# Patient Record
Sex: Male | Born: 1950 | Race: White | Hispanic: No | Marital: Married | Smoking: Current every day smoker
Health system: Southern US, Community
[De-identification: ages and names within clinical notes are randomized; demographics above are authoritative.]

## PROBLEM LIST (undated history)

## (undated) DIAGNOSIS — E119 Type 2 diabetes mellitus without complications: Secondary | ICD-10-CM

## (undated) DIAGNOSIS — M359 Systemic involvement of connective tissue, unspecified: Secondary | ICD-10-CM

## (undated) DIAGNOSIS — I739 Peripheral vascular disease, unspecified: Secondary | ICD-10-CM

## (undated) DIAGNOSIS — B351 Tinea unguium: Secondary | ICD-10-CM

## (undated) DIAGNOSIS — Z720 Tobacco use: Secondary | ICD-10-CM

## (undated) DIAGNOSIS — E1140 Type 2 diabetes mellitus with diabetic neuropathy, unspecified: Secondary | ICD-10-CM

## (undated) DIAGNOSIS — I96 Gangrene, not elsewhere classified: Secondary | ICD-10-CM

## (undated) DIAGNOSIS — I10 Essential (primary) hypertension: Secondary | ICD-10-CM

## (undated) DIAGNOSIS — E7800 Pure hypercholesterolemia, unspecified: Secondary | ICD-10-CM

## (undated) DIAGNOSIS — E079 Disorder of thyroid, unspecified: Secondary | ICD-10-CM

## (undated) HISTORY — DX: Disorder of thyroid, unspecified: E07.9

## (undated) HISTORY — DX: Gangrene, not elsewhere classified: I96

## (undated) HISTORY — DX: Tinea unguium: B35.1

## (undated) HISTORY — DX: Systemic involvement of connective tissue, unspecified: M35.9

## (undated) HISTORY — DX: Peripheral vascular disease, unspecified: I73.9

## (undated) HISTORY — PX: CHOLECYSTECTOMY: SHX55

## (undated) HISTORY — DX: Essential (primary) hypertension: I10

---

## 1999-09-11 ENCOUNTER — Ambulatory Visit (HOSPITAL_COMMUNITY): Admission: RE | Admit: 1999-09-11 | Discharge: 1999-09-11 | Payer: Self-pay | Admitting: Neurosurgery

## 1999-09-11 ENCOUNTER — Encounter: Payer: Self-pay | Admitting: Neurosurgery

## 2011-06-20 HISTORY — PX: CHOLECYSTECTOMY: SHX55

## 2011-10-08 ENCOUNTER — Encounter: Payer: Self-pay | Admitting: Surgery

## 2011-10-18 ENCOUNTER — Encounter: Payer: Self-pay | Admitting: Surgery

## 2011-10-21 ENCOUNTER — Encounter (INDEPENDENT_AMBULATORY_CARE_PROVIDER_SITE_OTHER): Payer: BC Managed Care – PPO | Admitting: *Deleted

## 2011-10-21 ENCOUNTER — Other Ambulatory Visit: Payer: Self-pay | Admitting: *Deleted

## 2011-10-21 ENCOUNTER — Encounter: Payer: Self-pay | Admitting: Surgery

## 2011-10-21 ENCOUNTER — Encounter: Payer: Self-pay | Admitting: *Deleted

## 2011-10-21 ENCOUNTER — Ambulatory Visit (INDEPENDENT_AMBULATORY_CARE_PROVIDER_SITE_OTHER): Payer: BC Managed Care – PPO | Admitting: *Deleted

## 2011-10-21 ENCOUNTER — Ambulatory Visit (INDEPENDENT_AMBULATORY_CARE_PROVIDER_SITE_OTHER): Payer: BC Managed Care – PPO | Admitting: Surgery

## 2011-10-21 VITALS — BP 120/76 | HR 96 | Temp 98.2°F | Ht 72.0 in | Wt 168.0 lb

## 2011-10-21 DIAGNOSIS — I70219 Atherosclerosis of native arteries of extremities with intermittent claudication, unspecified extremity: Secondary | ICD-10-CM

## 2011-10-21 DIAGNOSIS — M79609 Pain in unspecified limb: Secondary | ICD-10-CM

## 2011-10-21 DIAGNOSIS — M7989 Other specified soft tissue disorders: Secondary | ICD-10-CM

## 2011-10-21 NOTE — Procedures (Unsigned)
LOWER EXTREMITY ARTERIAL DUPLEX  INDICATION:  Bilateral claudication.  HISTORY: Diabetes:  Yes Cardiac:  No Hypertension:  Yes Smoking:  Yes Previous Surgery:  SINGLE LEVEL ARTERIAL EXAM                         RIGHT                LEFT Brachial: Anterior tibial: Posterior tibial: Peroneal: Ankle/Brachial Index:   0.97                 0.83 Prior ABI:  07/09/2011                       0.70                 0.70  LOWER EXTREMITY ARTERIAL DUPLEX EXAM  DUPLEX:  Calcified noncalcific plaque throughout both lower extremities with triphasic and biphasic waveforms from the common femoral artery to the popliteal arteries.  Stenosis is visualized in the right anterior tibial artery.  Damped monophasic waveforms noted at the posterior tibial arteries bilaterally.  IMPRESSION:  Bilateral tibioperoneal disease.  See attached for ABIs and imaging.  ___________________________________________ V. Charlena Cross, MD  SS/MEDQ  D:  10/21/2011  T:  10/21/2011  Job:  [id number]

## 2011-10-21 NOTE — Progress Notes (Signed)
[facility] and [facility]in Specialist of [facility]   Patient name: Adrian Bean MRN: [id number] DOB: 11-10-1950 Sex: male   Referred by: Dr. Sherral Hammers  Reason for referral:  Chief Complaint  Patient presents with  . New Evaluation    pt c/o numbness and pain in limbs referred by Dr. Golda Acre    HISTORY OF PRESENT ILLNESS: Since today for evaluation of numbness in his legs feet and arms. He states that she's been having numbness in his feet for approximately several months. He states that it is constant. There are no aggravating or relieving factors. He does not endorse signs or symptoms of claudication. He states that his legs do not get out or clamp. He has had a ulceration on the fifth toe on the right that has been draining for approximately one month. The patient is a diabetic he does not know his most recent hemoglobin A1c but he states his sugars are usually less than 200. The patient does smoke. He smokes about one pack every month. He is a borderline hypertensive. He is taken ACE inhibitor.  Past Medical History  Diagnosis Date  . Diabetes mellitus     type 2  . Peripheral vascular disease   . Hypertension   . Dermatophytosis, nail   . Thyroid disorder   . Diffuse connective tissue disease     Past Surgical History  Procedure Date  . Cholecystectomy   . Cholecystectomy 06-20-2011    History   Social History  . Marital Status: Married    Spouse Name: N/A    Number of Children: N/A  . Years of Education: N/A   Occupational History  . Not on file.   Social History Main Topics  . Smoking status: Current Everyday Smoker -- 0.1 packs/day for 50 years  . Smokeless tobacco: Not on file   Comment: pt states that he is not addicted, and it's not a big deal to quit  . Alcohol Use: 0.0 - 0.5 oz/week    0-1 drink(s) per week  . Drug Use: No  . Sexually Active: Not on file   Other Topics Concern  . Not on file   Social History Narrative  . No narrative on file    Family  History  Problem Relation Age of Onset  . Adopted: Yes    Allergies as of 10/21/2011  . (No Known Allergies)    Current Outpatient Prescriptions on File Prior to Visit  Medication Sig Dispense Refill  . gabapentin (NEURONTIN) 300 MG capsule Take 300 mg by mouth 3 (three) times daily.      Marland Kitchen glyBURIDE (DIABETA) 5 MG tablet Take 5 mg by mouth 2 (two) times daily.      Marland Kitchen lisinopril (PRINIVIL,ZESTRIL) 10 MG tablet Take 10 mg by mouth daily.      . metFORMIN (GLUCOPHAGE) 1000 MG tablet Take 1,000 mg by mouth daily.      Marland Kitchen terbinafine (LAMISIL) 250 MG tablet Take 250 mg by mouth daily.         REVIEW OF SYSTEMS: Positive for pain in legs with walking and lying flat. Positive for swelling in his legs. Positive for weakness in both his arms and legs. Positive for dizziness and temporary loss of vision. Positive for chills. All other systems are negative  PHYSICAL EXAMINATION: General: The patient appears their stated age.  Vital signs are BP 120/76  Pulse 96  Temp(Src) 98.2 F (36.8 C) (Oral)  Ht 6' (1.829 m)  Wt 168 lb (76.204 kg)  BMI  22.78 kg/m2 HEENT:  No gross abnormalities Pulmonary: Respirations are non-labored Abdomen: Soft and non-tender  Musculoskeletal: There are no major deformities.   Neurologic: No focal weakness or paresthesias are detected, Skin: 1 mm ulcer on the fifth toe without evidence of infection. There is serous drainage Psychiatric: The patient has normal affect. Cardiovascular: There is a regular rate and rhythm without significant murmur appreciated. No carotid bruits. Palpable femoral pulses. palpable radial pulses bilaterally  Diagnostic Studies: ABIs were rechecked today ABIs were 0.97 on the right and 20 30 left however waveforms are monophasic. Concern is for pulse elevated ABIs due to 2 calcifications    Assessment:  Right leg ulcer, bilateral numbness Plan: I discussed the ultrasound findings today with the patient. I'm not convinced that  arterial insufficiency is the etiology of his numbness. However, given that he has had a nonhealing small ulcer on his right fifth toe for the past month in the setting of monophasic waveforms by duplex certainly reasonable to proceed with arteriogram. I do this with the intent of intravenous possible. I plan on accessing the left groin, studying both legs, and intervening on the right if technically possible. I discussed with the patient this may or may not improve his numbness however in a diabetic with a chronic ulcer I feel that he bloodflow needs to be evaluated to do impossible to prevent limb loss. This procedure has been scheduled for Wednesday May 1st     V. Charlena Cross, M.D. [facility] of [place] Office: [phone] Pager:  [phone]

## 2011-10-23 ENCOUNTER — Encounter (HOSPITAL_COMMUNITY): Payer: Self-pay | Admitting: Respiratory Therapy

## 2011-10-30 ENCOUNTER — Encounter (HOSPITAL_COMMUNITY)
Admission: RE | Disposition: A | Discharge status: Home / Self Care | Payer: Self-pay | Source: Ambulatory Visit | Attending: Surgery

## 2011-10-30 ENCOUNTER — Telehealth: Payer: Self-pay | Admitting: Surgery

## 2011-10-30 ENCOUNTER — Ambulatory Visit (HOSPITAL_COMMUNITY)
Admission: RE | Admit: 2011-10-30 | Discharge: 2011-10-30 | Disposition: A | Discharge status: Home / Self Care | Payer: BC Managed Care – PPO | Source: Ambulatory Visit | Attending: Surgery | Admitting: Surgery

## 2011-10-30 DIAGNOSIS — L98499 Non-pressure chronic ulcer of skin of other sites with unspecified severity: Secondary | ICD-10-CM

## 2011-10-30 DIAGNOSIS — L97509 Non-pressure chronic ulcer of other part of unspecified foot with unspecified severity: Secondary | ICD-10-CM | POA: Insufficient documentation

## 2011-10-30 DIAGNOSIS — M358 Other specified systemic involvement of connective tissue: Secondary | ICD-10-CM | POA: Insufficient documentation

## 2011-10-30 DIAGNOSIS — I10 Essential (primary) hypertension: Secondary | ICD-10-CM | POA: Insufficient documentation

## 2011-10-30 DIAGNOSIS — F17200 Nicotine dependence, unspecified, uncomplicated: Secondary | ICD-10-CM | POA: Insufficient documentation

## 2011-10-30 DIAGNOSIS — M3589 Other specified systemic involvement of connective tissue: Secondary | ICD-10-CM | POA: Insufficient documentation

## 2011-10-30 DIAGNOSIS — I739 Peripheral vascular disease, unspecified: Secondary | ICD-10-CM | POA: Insufficient documentation

## 2011-10-30 DIAGNOSIS — E119 Type 2 diabetes mellitus without complications: Secondary | ICD-10-CM | POA: Insufficient documentation

## 2011-10-30 HISTORY — PX: ABDOMINAL AORTAGRAM: SHX5454

## 2011-10-30 LAB — POCT I-STAT, CHEM 8
BUN: 19 mg/dL (ref 6–23)
Calcium, Ion: 1.19 mmol/L (ref 1.12–1.32)
Chloride: 106 mEq/L (ref 96–112)
Glucose, Bld: 176 mg/dL — ABNORMAL HIGH (ref 70–99)

## 2011-10-30 LAB — POCT ACTIVATED CLOTTING TIME
Activated Clotting Time: 232 seconds
Activated Clotting Time: 188 seconds

## 2011-10-30 SURGERY — ABDOMINAL AORTAGRAM
Anesthesia: LOCAL

## 2011-10-30 MED ORDER — FENTANYL CITRATE 0.05 MG/ML IJ SOLN
INTRAMUSCULAR | Status: AC
  Filled 2011-10-30: qty 2

## 2011-10-30 MED ORDER — VERAPAMIL HCL 2.5 MG/ML IV SOLN
INTRAVENOUS | Status: AC
  Filled 2011-10-30: qty 2

## 2011-10-30 MED ORDER — MIDAZOLAM HCL 2 MG/2ML IJ SOLN
INTRAMUSCULAR | Status: AC
  Filled 2011-10-30: qty 2

## 2011-10-30 MED ORDER — LIDOCAINE HCL (PF) 1 % IJ SOLN
INTRAMUSCULAR | Status: AC
  Filled 2011-10-30: qty 30

## 2011-10-30 MED ORDER — HEPARIN (PORCINE) IN NACL 2-0.9 UNIT/ML-% IJ SOLN
INTRAMUSCULAR | Status: AC
  Filled 2011-10-30: qty 1000

## 2011-10-30 MED ORDER — SODIUM CHLORIDE 0.9 % IV SOLN
INTRAVENOUS | Status: DC
  Administered 2011-10-30: 1000 mL via INTRAVENOUS

## 2011-10-30 NOTE — Discharge Instructions (Signed)

## 2011-10-30 NOTE — Op Note (Addendum)
[facility]ists of [place]  Patient name: Adrian Bean MRN: [id number] DOB: Mar 27, 1951 Sex: male  10/30/2011 Pre-operative Diagnosis: Right leg ulcer Post-operative diagnosis:  Same Surgeon:  Jorge Ny Procedure Performed:  1.  ultrasound access left femoral artery  2.  abdominal aortogram  3.  bilateral lower extremity runoff  4.  atherectomy and angioplasty, right posterior tibial artery  5.  intra-arterial administration of nitroglycerin    Indications:  Patient has a history of pain in his right foot as well as a nonhealing ulcer. Ultrasound revealed monophasic waveforms and calcified vessels. He comes in today for arteriogram  Procedure:  The patient was identified in the holding area and taken to room 8.  The patient was then placed supine on the table and prepped and draped in the usual sterile fashion.  A time out was called.  Ultrasound was used to evaluate the left common femoral artery.  It was patent .  A digital ultrasound image was acquired.  A micropuncture needle was used to access the left common femoral artery under ultrasound guidance.  An 018 wire was advanced without resistance and a micropuncture sheath was placed.  The 018 wire was removed and a benson wire was placed.  The micropuncture sheath was exchanged for a 5 french sheath.  An omniflush catheter was advanced over the wire to the level of L-1.  An abdominal angiogram was obtained.  Next, using the omniflush catheter and a benson wire, the aortic bifurcation was crossed and the catheter was placed into theright external iliac artery and right runoff was obtained.  left runoff was performed via retrograde sheath injections.  Findings:   Aortogram:  The visualized portions of the suprarenal abdominal aorta showed no significant disease. The infrarenal abdominal aorta is widely patent. There are single renal arteries bilaterally without stenosis. Bilateral common external and internal iliac  arteries are patent throughout their course without hemodynamically significant stenosis.  Right Lower Extremity:  The right common femoral artery is patent throughout it's course. The right profunda femoral artery is patent the right superficial femoral artery is patent. There are calcific changes to the right superficial femoral artery in the adductor canal. The popliteal artery is patent throughout it's course. There is single vessel runoff via the posterior tibial artery. There is a high-grade stenosis, 95% at its origin  Left Lower Extremity:  The left common femoral artery is patent throughout it's course. The profunda femoral artery is patent. The superficial femoral artery is patent. There is approximately 20-30% stenosis within the adductor canal. The popliteal artery is patent. There is occlusion of all 3 runoff vessels. There is distal reconstitution of the posterior tibial artery which then collateralizes to the dorsalis pedis via the plantar arch  Intervention:  Over a Benson wire a 6 French 45 cm antral 1 sheath was placed. The patient was fully heparinized. Using an 014 wire and a CXI catheter I was able to cross the lesion in the posterior tibial artery. A wire exchange was performed and the viper wire was placed. I selected a CSI 1.25 atherectomy device and performed atherectomy at 6090 and 120 RPMs for 25 seconds each. I then performed angioplasty of the treated area using a 3 x 2 Fox SV balloon. 400 mcg of nitroglycerin were administered through the sheath. I performed angioplasty on 2 occasions. Completion angiography revealed widely patent posterior tibial artery with resolution of the stenosis. At this point the decision was made to terminate the procedure. Catheters  and wires were removed. The sheath was withdrawn to the left external iliac artery and the patient was taken to the holding area for sheath pull once the coagulation profile corrects.  Impression:  #1  successful atherectomy  and angioplasty of a 95% stenosis within the proximal right posterior tibial artery  #2  severe tibial disease on the left    V. Durene Cal, M.D. [facility] [place] Office: [phone] Pager:  [phone]

## 2011-10-30 NOTE — Telephone Encounter (Signed)
Message copied by Sara Chu on Wed Oct 30, 2011  4:59 PM ------      Message from: Melene Plan      Created: Wed Oct 30, 2011  3:12 PM                   ----- Message -----         From: Nada Libman, MD         Sent: 10/30/2011  12:30 PM           To: Cydney Ok, Melene Plan, RN            10/30/2011, the patient had the following procedures:             1.  ultrasound access left femoral artery       2.  abdominal aortogram       3.  bilateral lower extremity runoff       4.  atherectomy and angioplasty, right posterior tibial artery       5.  intra-arterial administration of nitroglycerin                  He needs followup in 3 weeks to see ME with bilateral ABI

## 2011-10-30 NOTE — Progress Notes (Signed)
Pt signed out AMA today post angiogram. Pt was given risks af getting up prior to D/C time. Dr Early called and notified.

## 2011-10-30 NOTE — Interval H&P Note (Signed)
History and Physical Interval Note:  10/30/2011 10:44 AM  Adrian Bean  has presented today for surgery, with the diagnosis of PVD  The various methods of treatment have been discussed with the patient and family. After consideration of risks, benefits and other options for treatment, the patient has consented to  Procedure(s) (LRB): ABDOMINAL AORTAGRAM (N/A) as a surgical intervention .  The patients' history has been reviewed, patient examined, no change in status, stable for surgery.  I have reviewed the patients' chart and labs.  Questions were answered to the patient's satisfaction.     Magon Croson IV, V. WELLS

## 2011-10-30 NOTE — H&P (View-Only) (Signed)
[facility]ascular and Vein Specialist of [place]   Patient name: Adrian Bean MRN: [id number] DOB: 01/01/1951 Sex: male   Referred by: Dr. Robbins  Reason for referral:  Chief Complaint  Patient presents with  . New Evaluation    pt c/o numbness and pain in limbs referred by Dr. Robbbins    HISTORY OF PRESENT ILLNESS: Since today for evaluation of numbness in his legs feet and arms. He states that she's been having numbness in his feet for approximately several months. He states that it is constant. There are no aggravating or relieving factors. He does not endorse signs or symptoms of claudication. He states that his legs do not get out or clamp. He has had a ulceration on the fifth toe on the right that has been draining for approximately one month. The patient is a diabetic he does not know his most recent hemoglobin A1c but he states his sugars are usually less than 200. The patient does smoke. He smokes about one pack every month. He is a borderline hypertensive. He is taken ACE inhibitor.  Past Medical History  Diagnosis Date  . Diabetes mellitus     type 2  . Peripheral vascular disease   . Hypertension   . Dermatophytosis, nail   . Thyroid disorder   . Diffuse connective tissue disease     Past Surgical History  Procedure Date  . Cholecystectomy   . Cholecystectomy 06-20-2011    History   Social History  . Marital Status: Married    Spouse Name: N/A    Number of Children: N/A  . Years of Education: N/A   Occupational History  . Not on file.   Social History Main Topics  . Smoking status: Current Everyday Smoker -- 0.1 packs/day for 50 years  . Smokeless tobacco: Not on file   Comment: pt states that he is not addicted, and it's not a big deal to quit  . Alcohol Use: 0.0 - 0.5 oz/week    0-1 drink(s) per week  . Drug Use: No  . Sexually Active: Not on file   Other Topics Concern  . Not on file   Social History Narrative  . No narrative on file    Family  History  Problem Relation Age of Onset  . Adopted: Yes    Allergies as of 10/21/2011  . (No Known Allergies)    Current Outpatient Prescriptions on File Prior to Visit  Medication Sig Dispense Refill  . gabapentin (NEURONTIN) 300 MG capsule Take 300 mg by mouth 3 (three) times daily.      . glyBURIDE (DIABETA) 5 MG tablet Take 5 mg by mouth 2 (two) times daily.      . lisinopril (PRINIVIL,ZESTRIL) 10 MG tablet Take 10 mg by mouth daily.      . metFORMIN (GLUCOPHAGE) 1000 MG tablet Take 1,000 mg by mouth daily.      . terbinafine (LAMISIL) 250 MG tablet Take 250 mg by mouth daily.         REVIEW OF SYSTEMS: Positive for pain in legs with walking and lying flat. Positive for swelling in his legs. Positive for weakness in both his arms and legs. Positive for dizziness and temporary loss of vision. Positive for chills. All other systems are negative  PHYSICAL EXAMINATION: General: The patient appears their stated age.  Vital signs are BP 120/76  Pulse 96  Temp(Src) 98.2 F (36.8 C) (Oral)  Ht 6' (1.829 m)  Wt 168 lb (76.204 kg)  BMI   22.78 kg/m2 HEENT:  No gross abnormalities Pulmonary: Respirations are non-labored Abdomen: Soft and non-tender  Musculoskeletal: There are no major deformities.   Neurologic: No focal weakness or paresthesias are detected, Skin: 1 mm ulcer on the fifth toe without evidence of infection. There is serous drainage Psychiatric: The patient has normal affect. Cardiovascular: There is a regular rate and rhythm without significant murmur appreciated. No carotid bruits. Palpable femoral pulses. palpable radial pulses bilaterally  Diagnostic Studies: ABIs were rechecked today ABIs were 0.97 on the right and 20 30 left however waveforms are monophasic. Concern is for pulse elevated ABIs due to 2 calcifications    Assessment:  Right leg ulcer, bilateral numbness Plan: I discussed the ultrasound findings today with the patient. I'm not convinced that  arterial insufficiency is the etiology of his numbness. However, given that he has had a nonhealing small ulcer on his right fifth toe for the past month in the setting of monophasic waveforms by duplex certainly reasonable to proceed with arteriogram. I do this with the intent of intravenous possible. I plan on accessing the left groin, studying both legs, and intervening on the right if technically possible. I discussed with the patient this may or may not improve his numbness however in a diabetic with a chronic ulcer I feel that he bloodflow needs to be evaluated to do impossible to prevent limb loss. This procedure has been scheduled for Wednesday May 1st     V. Wells Sreeja Spies IV, M.D. [facility] [place] Office: [phone] Pager:  [phone]   

## 2011-10-30 NOTE — Telephone Encounter (Signed)
Tried to call patient with appt date/time -- mailed appt reminder

## 2011-10-31 ENCOUNTER — Other Ambulatory Visit: Payer: Self-pay | Admitting: *Deleted

## 2011-10-31 DIAGNOSIS — I70219 Atherosclerosis of native arteries of extremities with intermittent claudication, unspecified extremity: Secondary | ICD-10-CM

## 2011-11-14 ENCOUNTER — Telehealth: Payer: Self-pay | Admitting: Surgery

## 2011-11-14 NOTE — Telephone Encounter (Signed)
I attempted to call patient but there was no answer and no voice mail. Patient called Revonda Standard to cancel his appt for 11/18/11 w/ [facility] and he does not wish to reschedule. Her note is w/ staff messages from 11/14/11. Jacklyn Shell

## 2011-11-18 ENCOUNTER — Ambulatory Visit: Payer: BC Managed Care – PPO | Admitting: Surgery

## 2014-06-09 ENCOUNTER — Encounter (HOSPITAL_COMMUNITY): Payer: Self-pay | Admitting: Surgery

## 2016-07-01 HISTORY — PX: FOOT RAY RESECTION: SHX1668

## 2016-10-16 DIAGNOSIS — F17200 Nicotine dependence, unspecified, uncomplicated: Secondary | ICD-10-CM | POA: Diagnosis not present

## 2016-10-16 DIAGNOSIS — Z9119 Patient's noncompliance with other medical treatment and regimen: Secondary | ICD-10-CM | POA: Diagnosis not present

## 2016-10-16 DIAGNOSIS — R600 Localized edema: Secondary | ICD-10-CM

## 2016-10-16 DIAGNOSIS — E119 Type 2 diabetes mellitus without complications: Secondary | ICD-10-CM

## 2016-10-16 DIAGNOSIS — N179 Acute kidney failure, unspecified: Secondary | ICD-10-CM | POA: Diagnosis not present

## 2016-10-16 DIAGNOSIS — Z8679 Personal history of other diseases of the circulatory system: Secondary | ICD-10-CM | POA: Diagnosis not present

## 2016-10-16 DIAGNOSIS — E11621 Type 2 diabetes mellitus with foot ulcer: Secondary | ICD-10-CM

## 2016-10-16 DIAGNOSIS — M869 Osteomyelitis, unspecified: Secondary | ICD-10-CM

## 2016-10-17 DIAGNOSIS — R600 Localized edema: Secondary | ICD-10-CM | POA: Diagnosis not present

## 2016-10-17 DIAGNOSIS — E119 Type 2 diabetes mellitus without complications: Secondary | ICD-10-CM | POA: Diagnosis not present

## 2016-10-17 DIAGNOSIS — E11621 Type 2 diabetes mellitus with foot ulcer: Secondary | ICD-10-CM | POA: Diagnosis not present

## 2016-10-17 DIAGNOSIS — M869 Osteomyelitis, unspecified: Secondary | ICD-10-CM | POA: Diagnosis not present

## 2016-10-17 DIAGNOSIS — F17200 Nicotine dependence, unspecified, uncomplicated: Secondary | ICD-10-CM

## 2016-10-18 DIAGNOSIS — E11621 Type 2 diabetes mellitus with foot ulcer: Secondary | ICD-10-CM | POA: Diagnosis not present

## 2016-10-18 DIAGNOSIS — R600 Localized edema: Secondary | ICD-10-CM | POA: Diagnosis not present

## 2016-10-18 DIAGNOSIS — E119 Type 2 diabetes mellitus without complications: Secondary | ICD-10-CM | POA: Diagnosis not present

## 2016-10-18 DIAGNOSIS — M869 Osteomyelitis, unspecified: Secondary | ICD-10-CM | POA: Diagnosis not present

## 2016-10-19 DIAGNOSIS — E11621 Type 2 diabetes mellitus with foot ulcer: Secondary | ICD-10-CM | POA: Diagnosis not present

## 2016-10-19 DIAGNOSIS — R600 Localized edema: Secondary | ICD-10-CM | POA: Diagnosis not present

## 2016-10-19 DIAGNOSIS — E119 Type 2 diabetes mellitus without complications: Secondary | ICD-10-CM | POA: Diagnosis not present

## 2016-10-19 DIAGNOSIS — M869 Osteomyelitis, unspecified: Secondary | ICD-10-CM | POA: Diagnosis not present

## 2016-10-20 DIAGNOSIS — M869 Osteomyelitis, unspecified: Secondary | ICD-10-CM | POA: Diagnosis not present

## 2016-10-20 DIAGNOSIS — E119 Type 2 diabetes mellitus without complications: Secondary | ICD-10-CM | POA: Diagnosis not present

## 2016-10-20 DIAGNOSIS — E11621 Type 2 diabetes mellitus with foot ulcer: Secondary | ICD-10-CM | POA: Diagnosis not present

## 2016-10-20 DIAGNOSIS — N179 Acute kidney failure, unspecified: Secondary | ICD-10-CM

## 2016-10-20 DIAGNOSIS — R600 Localized edema: Secondary | ICD-10-CM | POA: Diagnosis not present

## 2016-10-21 DIAGNOSIS — E11621 Type 2 diabetes mellitus with foot ulcer: Secondary | ICD-10-CM | POA: Diagnosis not present

## 2016-10-21 DIAGNOSIS — R600 Localized edema: Secondary | ICD-10-CM | POA: Diagnosis not present

## 2016-10-21 DIAGNOSIS — M869 Osteomyelitis, unspecified: Secondary | ICD-10-CM | POA: Diagnosis not present

## 2016-10-21 DIAGNOSIS — E119 Type 2 diabetes mellitus without complications: Secondary | ICD-10-CM | POA: Diagnosis not present

## 2016-10-22 DIAGNOSIS — E119 Type 2 diabetes mellitus without complications: Secondary | ICD-10-CM | POA: Diagnosis not present

## 2016-10-22 DIAGNOSIS — M869 Osteomyelitis, unspecified: Secondary | ICD-10-CM | POA: Diagnosis not present

## 2016-10-22 DIAGNOSIS — R600 Localized edema: Secondary | ICD-10-CM | POA: Diagnosis not present

## 2016-10-22 DIAGNOSIS — E11621 Type 2 diabetes mellitus with foot ulcer: Secondary | ICD-10-CM | POA: Diagnosis not present

## 2016-10-23 DIAGNOSIS — M869 Osteomyelitis, unspecified: Secondary | ICD-10-CM | POA: Diagnosis not present

## 2016-10-23 DIAGNOSIS — R600 Localized edema: Secondary | ICD-10-CM | POA: Diagnosis not present

## 2016-10-23 DIAGNOSIS — E11621 Type 2 diabetes mellitus with foot ulcer: Secondary | ICD-10-CM | POA: Diagnosis not present

## 2016-10-23 DIAGNOSIS — E119 Type 2 diabetes mellitus without complications: Secondary | ICD-10-CM | POA: Diagnosis not present

## 2016-10-24 DIAGNOSIS — E119 Type 2 diabetes mellitus without complications: Secondary | ICD-10-CM | POA: Diagnosis not present

## 2016-10-24 DIAGNOSIS — E11621 Type 2 diabetes mellitus with foot ulcer: Secondary | ICD-10-CM | POA: Diagnosis not present

## 2016-10-24 DIAGNOSIS — M869 Osteomyelitis, unspecified: Secondary | ICD-10-CM | POA: Diagnosis not present

## 2016-10-24 DIAGNOSIS — R600 Localized edema: Secondary | ICD-10-CM | POA: Diagnosis not present

## 2016-10-25 DIAGNOSIS — M869 Osteomyelitis, unspecified: Secondary | ICD-10-CM | POA: Diagnosis not present

## 2016-10-25 DIAGNOSIS — E119 Type 2 diabetes mellitus without complications: Secondary | ICD-10-CM | POA: Diagnosis not present

## 2016-10-25 DIAGNOSIS — E11621 Type 2 diabetes mellitus with foot ulcer: Secondary | ICD-10-CM | POA: Diagnosis not present

## 2016-10-25 DIAGNOSIS — R600 Localized edema: Secondary | ICD-10-CM | POA: Diagnosis not present

## 2016-12-29 HISTORY — PX: PERIPHERAL VASCULAR CATHETERIZATION: SHX172C

## 2017-01-03 ENCOUNTER — Other Ambulatory Visit (HOSPITAL_COMMUNITY): Payer: Self-pay | Admitting: Interventional Radiology

## 2017-01-03 DIAGNOSIS — I771 Stricture of artery: Secondary | ICD-10-CM

## 2017-01-10 ENCOUNTER — Other Ambulatory Visit: Payer: Self-pay | Admitting: Student

## 2017-01-10 ENCOUNTER — Other Ambulatory Visit: Payer: Self-pay | Admitting: Physician Assistant

## 2017-01-13 ENCOUNTER — Ambulatory Visit (HOSPITAL_COMMUNITY): Admission: RE | Admit: 2017-01-13 | Payer: Medicare Other | Source: Ambulatory Visit

## 2017-01-13 ENCOUNTER — Telehealth (HOSPITAL_COMMUNITY): Payer: Self-pay | Admitting: Radiology

## 2017-01-15 ENCOUNTER — Other Ambulatory Visit: Payer: Self-pay | Admitting: Radiology

## 2017-01-17 ENCOUNTER — Ambulatory Visit (HOSPITAL_COMMUNITY)
Admission: RE | Admit: 2017-01-17 | Discharge: 2017-01-17 | Disposition: A | Discharge status: Home / Self Care | Payer: BC Managed Care – PPO | Source: Ambulatory Visit | Attending: Interventional Radiology | Admitting: Interventional Radiology

## 2017-01-17 ENCOUNTER — Other Ambulatory Visit (HOSPITAL_COMMUNITY): Payer: Self-pay | Admitting: Interventional Radiology

## 2017-01-17 ENCOUNTER — Encounter (HOSPITAL_COMMUNITY): Payer: Self-pay

## 2017-01-17 DIAGNOSIS — Z7984 Long term (current) use of oral hypoglycemic drugs: Secondary | ICD-10-CM | POA: Insufficient documentation

## 2017-01-17 DIAGNOSIS — G8929 Other chronic pain: Secondary | ICD-10-CM | POA: Diagnosis not present

## 2017-01-17 DIAGNOSIS — I70222 Atherosclerosis of native arteries of extremities with rest pain, left leg: Secondary | ICD-10-CM | POA: Insufficient documentation

## 2017-01-17 DIAGNOSIS — Z7902 Long term (current) use of antithrombotics/antiplatelets: Secondary | ICD-10-CM | POA: Insufficient documentation

## 2017-01-17 DIAGNOSIS — E785 Hyperlipidemia, unspecified: Secondary | ICD-10-CM | POA: Insufficient documentation

## 2017-01-17 DIAGNOSIS — F17210 Nicotine dependence, cigarettes, uncomplicated: Secondary | ICD-10-CM | POA: Insufficient documentation

## 2017-01-17 DIAGNOSIS — E079 Disorder of thyroid, unspecified: Secondary | ICD-10-CM | POA: Diagnosis not present

## 2017-01-17 DIAGNOSIS — I10 Essential (primary) hypertension: Secondary | ICD-10-CM | POA: Diagnosis not present

## 2017-01-17 DIAGNOSIS — E1142 Type 2 diabetes mellitus with diabetic polyneuropathy: Secondary | ICD-10-CM | POA: Diagnosis not present

## 2017-01-17 DIAGNOSIS — E1151 Type 2 diabetes mellitus with diabetic peripheral angiopathy without gangrene: Secondary | ICD-10-CM | POA: Insufficient documentation

## 2017-01-17 DIAGNOSIS — Z89422 Acquired absence of other left toe(s): Secondary | ICD-10-CM | POA: Insufficient documentation

## 2017-01-17 DIAGNOSIS — I771 Stricture of artery: Secondary | ICD-10-CM

## 2017-01-17 HISTORY — PX: IR ILIAC ART STENT INC PTA MOD SED: IMG2306

## 2017-01-17 HISTORY — PX: IR US GUIDE VASC ACCESS LEFT: IMG2389

## 2017-01-17 HISTORY — PX: IR ANGIOGRAM EXTREMITY LEFT: IMG651

## 2017-01-17 HISTORY — PX: IR US GUIDE VASC ACCESS RIGHT: IMG2390

## 2017-01-17 HISTORY — PX: IR ILIAC ART STENT INC PTA EA ADD IPSILAT MOD SED: IMG2308

## 2017-01-17 HISTORY — PX: IR FEM POP ART PTA MOD SED: IMG2309

## 2017-01-17 LAB — BASIC METABOLIC PANEL
Anion gap: 11 (ref 5–15)
BUN: 15 mg/dL (ref 6–20)
CHLORIDE: 104 mmol/L (ref 101–111)
CO2: 23 mmol/L (ref 22–32)
Calcium: 9.5 mg/dL (ref 8.9–10.3)
Creatinine, Ser: 1.06 mg/dL (ref 0.61–1.24)
GFR calc non Af Amer: >60 mL/min (ref >60)
Glucose, Bld: 117 mg/dL — ABNORMAL HIGH (ref 65–99)
POTASSIUM: 4.5 mmol/L (ref 3.5–5.1)
SODIUM: 138 mmol/L (ref 135–145)

## 2017-01-17 LAB — APTT: aPTT: 39 seconds — ABNORMAL HIGH (ref 24–36)

## 2017-01-17 LAB — PROTIME-INR
INR: 1.05
Prothrombin Time: 13.7 seconds (ref 11.4–15.2)

## 2017-01-17 LAB — CBC
HEMATOCRIT: 40.1 % (ref 39.0–52.0)
Hemoglobin: 13.1 g/dL (ref 13.0–17.0)
MCH: 29.9 pg (ref 26.0–34.0)
MCHC: 32.7 g/dL (ref 30.0–36.0)
MCV: 91.6 fL (ref 78.0–100.0)
Platelets: 328 10*3/uL (ref 150–400)
RBC: 4.38 MIL/uL (ref 4.22–5.81)
RDW: 14.3 % (ref 11.5–15.5)
WBC: 12.1 10*3/uL — AB (ref 4.0–10.5)

## 2017-01-17 LAB — GLUCOSE, CAPILLARY: GLUCOSE-CAPILLARY: 96 mg/dL (ref 65–99)

## 2017-01-17 MED ORDER — IODIXANOL 320 MG/ML IV SOLN: 160.0000 mL | Freq: Once | INTRAVENOUS | Status: DC | PRN

## 2017-01-17 MED ORDER — IOPAMIDOL (ISOVUE-300) INJECTION 61%
INTRAVENOUS | Status: AC
  Administered 2017-01-17: 2 mL
  Filled 2017-01-17: qty 100

## 2017-01-17 MED ORDER — LIDOCAINE HCL (PF) 1 % IJ SOLN
INTRAMUSCULAR | Status: AC | PRN
  Administered 2017-01-17: 5 mL

## 2017-01-17 MED ORDER — IOPAMIDOL (ISOVUE-300) INJECTION 61%
INTRAVENOUS | Status: AC
  Filled 2017-01-17: qty 100

## 2017-01-17 MED ORDER — FENTANYL CITRATE (PF) 100 MCG/2ML IJ SOLN
INTRAMUSCULAR | Status: AC
  Filled 2017-01-17: qty 4

## 2017-01-17 MED ORDER — MIDAZOLAM HCL 2 MG/2ML IJ SOLN
INTRAMUSCULAR | Status: AC | PRN
  Administered 2017-01-17 (×2): 1 mg via INTRAVENOUS
  Administered 2017-01-17 (×2): 0.5 mg via INTRAVENOUS
  Administered 2017-01-17: 1 mg via INTRAVENOUS
  Administered 2017-01-17 (×4): 0.5 mg via INTRAVENOUS

## 2017-01-17 MED ORDER — HEPARIN SODIUM (PORCINE) 1000 UNIT/ML IJ SOLN
INTRAMUSCULAR | Status: AC
  Administered 2017-01-17: 6000 [IU]
  Filled 2017-01-17: qty 1

## 2017-01-17 MED ORDER — MIDAZOLAM HCL 2 MG/2ML IJ SOLN
INTRAMUSCULAR | Status: AC
  Filled 2017-01-17: qty 6

## 2017-01-17 MED ORDER — CEFAZOLIN SODIUM-DEXTROSE 2-4 GM/100ML-% IV SOLN
INTRAVENOUS | Status: AC
  Administered 2017-01-17: 2000 mg
  Filled 2017-01-17: qty 100

## 2017-01-17 MED ORDER — LIDOCAINE HCL (PF) 1 % IJ SOLN
INTRAMUSCULAR | Status: AC
  Filled 2017-01-17: qty 30

## 2017-01-17 MED ORDER — HYDROCODONE-ACETAMINOPHEN 5-325 MG PO TABS: 1.0000 | ORAL_TABLET | ORAL | Status: DC | PRN

## 2017-01-17 MED ORDER — FENTANYL CITRATE (PF) 100 MCG/2ML IJ SOLN
INTRAMUSCULAR | Status: AC | PRN
  Administered 2017-01-17 (×4): 25 ug via INTRAVENOUS
  Administered 2017-01-17: 50 ug via INTRAVENOUS
  Administered 2017-01-17 (×2): 25 ug via INTRAVENOUS

## 2017-01-17 MED ORDER — LIDOCAINE HCL (PF) 1 % IJ SOLN
INTRAMUSCULAR | Status: DC | PRN
  Administered 2017-01-17: 10 mL

## 2017-01-17 MED ORDER — SODIUM CHLORIDE 0.9 % IV SOLN
INTRAVENOUS | Status: DC
  Administered 2017-01-17: 10:00:00 via INTRAVENOUS

## 2017-01-17 NOTE — Sedation Documentation (Signed)
Patient is resting comfortably. 

## 2017-01-17 NOTE — Discharge Instructions (Addendum)

## 2017-01-17 NOTE — Procedures (Signed)
Interventional Radiology Procedure Note  Procedure:  1.) LLE arteriogram 2.) DCB-PTA of proximal SFA stenoses to 64mm 3.) Placement of 10 x 38 iCAST stentgraft across left CIA stenosis 4.) Placement of 8 x 60 mm Inova stent across EIA stenoses  Vascular Access: Right CFA, 73F --> ExoSeal and LEFT CFA, 72F --> ExoSeal   Complications: None  Estimated Blood Loss: <25 mL  Recommendations: - Continue plavix - Bedrest x3 hrs - F/U in clinic in 2 weeks  Signed,  Criselda Peaches, MD

## 2017-01-17 NOTE — Sedation Documentation (Signed)
Bilateral LLE dressings clean dry and intact, free from blood. Pulses checked with Short Stay. LLE +1 / RLE doppler. Skin is warm and intact.

## 2017-01-17 NOTE — H&P (Signed)
Chief Complaint: Patient was seen in consultation today for  at the request of Dr Brynda Greathouse  Referring Physician(s): Dr Meda Coffee  Supervising Physician: Jacqulynn Cadet  Patient Status: [facility] - Out-pt  History of Present Illness: Adrian Bean is a 66 y.o. male   Dr Laurence Ferrari note 01/03/17: Adrian Bean is a 66 year old male smoker with diabetes, hypertension and hyperlipidemia who presents at the kind request of Dr. Meda Coffee for evaluation of his peripheral arterial disease. 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 He has had multiple episodes of spontaneous diabetic pressure ulcers, boils, and wounds affecting both his upper and lower extremities over the last several years. He can remember no inciting event for the wounds to is left fourth and fifth toes. He feels that the chest began spontaneously over night. He also suffers from fairly significant pain, both rest pain in the left foot and peripheral neuropathy affecting his right arm and bilateral lower extremities. He is quite concerned with his chronic pain. He takes gabapentin 300 mg 3 times daily which helps some but he also requires narcotic pain medications to manage this pain and feels that in occurring climate it is almost impossible to get prescriptions from his various physicians. He went so far is to state that if he cannot get control of his pain at some point in the future he would not be able to go on living like this. He denies current or immediate suicidal ideations.  Scheduled today for bilateral lower extremity arteriogram with Left lower extremity angioplasty/stent placement.  Past Medical History:  Diagnosis Date  . Dermatophytosis, nail   . Diabetes mellitus    type 2  . Diffuse connective tissue disease ([place])   . Hypertension   . Peripheral vascular disease ([place])   .  Thyroid disorder     Past Surgical History:  Procedure Laterality Date  . ABDOMINAL AORTAGRAM N/A 10/30/2011   Procedure: ABDOMINAL Maxcine Ham;  Surgeon: Serafina Mitchell, MD;  Location: [facility] CATH LAB;  Service: Cardiovascular;  Laterality: N/A;  . CHOLECYSTECTOMY    . CHOLECYSTECTOMY  06-20-2011    Allergies: Patient has no known allergies.  Medications: Prior to Admission medications   Medication Sig Start Date End Date Taking? Authorizing Provider  atorvastatin (LIPITOR) 10 MG tablet Take 10 mg by mouth daily.   Yes [provider]  clopidogrel (PLAVIX) 75 MG tablet Take 75 mg by mouth daily.   Yes [provider]  gabapentin (NEURONTIN) 300 MG capsule Take 300 mg by mouth 3 (three) times daily.   Yes [provider]  glimepiride (AMARYL) 2 MG tablet Take 2 mg by mouth daily with breakfast.   Yes [provider]  HYDROcodone-acetaminophen (NORCO) 10-325 MG tablet Take 1 tablet by mouth every 6 (six) hours as needed for moderate pain.   Yes [provider]  lisinopril (PRINIVIL,ZESTRIL) 10 MG tablet Take 10 mg by mouth daily. 08/03/11  Yes [provider]  multivitamin-iron-minerals-folic acid (CENTRUM) chewable tablet Chew 1 tablet by mouth daily.   Yes [provider]  Omega-3 Fatty Acids (FISH OIL PO) Take 1 tablet by mouth daily.   Yes [provider]  polyethylene glycol (MIRALAX / GLYCOLAX) packet Take 17 g by mouth daily as needed for mild constipation.   Yes [provider]  terbinafine (LAMISIL) 250 MG tablet Take 250 mg by mouth daily. 07/09/11  Yes [provider]     Family History  Problem Relation Age of Onset  . Adopted: Yes    Social History   Social History  . Marital status: Married    Spouse name: N/A  . Number of children: N/A  . Years of education: N/A   Social History Main Topics  . Smoking status: Current Every Day Smoker    Packs/day: 0.10    Years: 50.00  .  Smokeless tobacco: None     Comment: pt states that he is not addicted, and it's not a big deal to quit  . Alcohol use 0.0 - 0.5 oz/week    0 - 1 drink(s) per week  . Drug use: No  . Sexual activity: Not Asked   Other Topics Concern  . None   Social History Narrative  . None    Review of Systems: A 12 point ROS discussed and pertinent positives are indicated in the HPI above.  All other systems are negative.  Review of Systems  Constitutional: Positive for activity change. Negative for fatigue.  Respiratory: Negative for cough and shortness of breath.   Gastrointestinal: Negative for abdominal pain.  Musculoskeletal: Positive for gait problem. Negative for back pain.  Psychiatric/Behavioral: Negative for behavioral problems and confusion.    Vital Signs: BP (!) 161/86 (BP Location: Right Arm)   Pulse 67   Temp 97.8 F (36.6 C) (Oral)   Resp 16   Ht 6' (1.829 m)   Wt 165 lb (74.8 kg)   SpO2 100%   BMI 22.38 kg/m   Physical Exam  Constitutional: He is oriented to person, place, and time.  HENT:  Head: Atraumatic.  Cardiovascular: Normal rate and regular rhythm.   Pulmonary/Chest: Effort normal and breath sounds normal.  Abdominal: Soft. Bowel sounds are normal.  Musculoskeletal: Normal range of motion.  Left 4th and 5th toes amputated Partial lateral foot amputation  Neurological: He is alert and oriented to person, place, and time.  Skin: Skin is warm and dry.  Psychiatric: He has a normal mood and affect. His behavior is normal. Judgment and thought content normal.  Nursing note and vitals reviewed.   Mallampati Score:  MD Evaluation Airway: WNL Heart: WNL Abdomen: WNL Chest/ Lungs: WNL ASA  Classification: 3 Mallampati/Airway Score: One  Imaging: No results found.  Labs:  CBC:  Recent Labs  01/17/17 0736  WBC 12.1*  HGB 13.1  HCT 40.1  PLT 328    COAGS:  Recent Labs  01/17/17 0736  INR 1.05  APTT 39*    BMP:  Recent Labs   01/17/17 0736  NA 138  K 4.5  CL 104  CO2 23  GLUCOSE 117*  BUN 15  CALCIUM 9.5  CREATININE 1.06  GFRNONAA >60  GFRAA >60    LIVER FUNCTION TESTS: No results for input(s): BILITOT, AST, ALT, ALKPHOS, PROT, ALBUMIN in the last 8760 hours.  TUMOR MARKERS: No results for input(s): AFPTM, CEA, CA199, CHROMGRNA in the last 8760 hours.  Assessment and Plan:  Schedule for left lower extremity arteriogram with probable stenting of the left common and external iliac arteries as well as drug coated balloon angioplasty of multifocal disease in the left superficial femoral artery. He will then require a second endovascular procedure for attempted recanalization of the occluded anterior and posterior tibial arteries.  Risks and Benefits discussed with the patient including, but not limited to bleeding, infection, vascular injury or contrast induced renal failure. All of the patient's questions were answered, patient is  agreeable to proceed. Consent signed and in chart.  Thank you for this interesting consult.  I greatly enjoyed meeting Adrian Bean and look forward to participating in their care.  A copy of this report was sent to the requesting provider on this date.  Electronically Signed: Lavonia Drafts, PA-C 01/17/2017, 9:24 AM   I spent a total of  30 Minutes   in face to face in clinical consultation, greater than 50% of which was counseling/coordinating care for left lower extr arteriogram with intervention

## 2017-01-17 NOTE — Sedation Documentation (Signed)
Pressure being held.

## 2017-01-17 NOTE — Progress Notes (Signed)
Pt had a left groin re-bleed after coughing.  Manual pressure held for 15 mins and redressed with a pressure dressing.  Level 0 at this time.  Dr Shellia Cleverly in to see pt .  Bedrest extended for 2 more hours.  Report off to Flavia Shipper RN

## 2017-01-17 NOTE — Progress Notes (Signed)
Client up and walked and tolerated well bilat groins stable no bleeding or hematoma

## 2017-01-22 DIAGNOSIS — W57XXXA Bitten or stung by nonvenomous insect and other nonvenomous arthropods, initial encounter: Secondary | ICD-10-CM | POA: Insufficient documentation

## 2017-06-01 DIAGNOSIS — F17200 Nicotine dependence, unspecified, uncomplicated: Secondary | ICD-10-CM | POA: Diagnosis not present

## 2017-06-01 DIAGNOSIS — I739 Peripheral vascular disease, unspecified: Secondary | ICD-10-CM | POA: Diagnosis not present

## 2017-06-01 DIAGNOSIS — S92341A Displaced fracture of fourth metatarsal bone, right foot, initial encounter for closed fracture: Secondary | ICD-10-CM

## 2017-06-01 DIAGNOSIS — L03115 Cellulitis of right lower limb: Secondary | ICD-10-CM | POA: Diagnosis not present

## 2017-06-01 DIAGNOSIS — I96 Gangrene, not elsewhere classified: Secondary | ICD-10-CM | POA: Diagnosis not present

## 2017-06-01 DIAGNOSIS — R062 Wheezing: Secondary | ICD-10-CM | POA: Diagnosis not present

## 2017-06-01 DIAGNOSIS — E1152 Type 2 diabetes mellitus with diabetic peripheral angiopathy with gangrene: Secondary | ICD-10-CM | POA: Diagnosis not present

## 2017-06-01 DIAGNOSIS — M86171 Other acute osteomyelitis, right ankle and foot: Secondary | ICD-10-CM | POA: Diagnosis not present

## 2017-06-02 ENCOUNTER — Other Ambulatory Visit: Payer: Self-pay

## 2017-06-02 ENCOUNTER — Inpatient Hospital Stay (HOSPITAL_COMMUNITY)
Admission: AD | Admit: 2017-06-02 | Discharge: 2017-06-17 | DRG: 239 | Disposition: A | Discharge status: Skilled Nursing Facility | Payer: Medicare Other | Source: Other Acute Inpatient Hospital | Attending: Family Medicine | Admitting: Family Medicine

## 2017-06-02 ENCOUNTER — Encounter (HOSPITAL_COMMUNITY): Payer: Self-pay | Admitting: Internal Medicine

## 2017-06-02 DIAGNOSIS — Z95828 Presence of other vascular implants and grafts: Secondary | ICD-10-CM | POA: Diagnosis not present

## 2017-06-02 DIAGNOSIS — Z720 Tobacco use: Secondary | ICD-10-CM

## 2017-06-02 DIAGNOSIS — Z89432 Acquired absence of left foot: Secondary | ICD-10-CM

## 2017-06-02 DIAGNOSIS — Z8249 Family history of ischemic heart disease and other diseases of the circulatory system: Secondary | ICD-10-CM

## 2017-06-02 DIAGNOSIS — A419 Sepsis, unspecified organism: Secondary | ICD-10-CM | POA: Diagnosis not present

## 2017-06-02 DIAGNOSIS — Z716 Tobacco abuse counseling: Secondary | ICD-10-CM

## 2017-06-02 DIAGNOSIS — J90 Pleural effusion, not elsewhere classified: Secondary | ICD-10-CM

## 2017-06-02 DIAGNOSIS — I96 Gangrene, not elsewhere classified: Secondary | ICD-10-CM | POA: Diagnosis not present

## 2017-06-02 DIAGNOSIS — J9601 Acute respiratory failure with hypoxia: Secondary | ICD-10-CM | POA: Diagnosis not present

## 2017-06-02 DIAGNOSIS — J440 Chronic obstructive pulmonary disease with acute lower respiratory infection: Secondary | ICD-10-CM | POA: Diagnosis not present

## 2017-06-02 DIAGNOSIS — E8809 Other disorders of plasma-protein metabolism, not elsewhere classified: Secondary | ICD-10-CM | POA: Diagnosis present

## 2017-06-02 DIAGNOSIS — R062 Wheezing: Secondary | ICD-10-CM | POA: Diagnosis not present

## 2017-06-02 DIAGNOSIS — Z833 Family history of diabetes mellitus: Secondary | ICD-10-CM | POA: Diagnosis not present

## 2017-06-02 DIAGNOSIS — J189 Pneumonia, unspecified organism: Secondary | ICD-10-CM | POA: Diagnosis not present

## 2017-06-02 DIAGNOSIS — N179 Acute kidney failure, unspecified: Secondary | ICD-10-CM | POA: Diagnosis not present

## 2017-06-02 DIAGNOSIS — E1140 Type 2 diabetes mellitus with diabetic neuropathy, unspecified: Secondary | ICD-10-CM

## 2017-06-02 DIAGNOSIS — Z23 Encounter for immunization: Secondary | ICD-10-CM | POA: Diagnosis present

## 2017-06-02 DIAGNOSIS — E1142 Type 2 diabetes mellitus with diabetic polyneuropathy: Secondary | ICD-10-CM | POA: Diagnosis present

## 2017-06-02 DIAGNOSIS — T502X5A Adverse effect of carbonic-anhydrase inhibitors, benzothiadiazides and other diuretics, initial encounter: Secondary | ICD-10-CM | POA: Diagnosis not present

## 2017-06-02 DIAGNOSIS — R0602 Shortness of breath: Secondary | ICD-10-CM

## 2017-06-02 DIAGNOSIS — L03115 Cellulitis of right lower limb: Secondary | ICD-10-CM | POA: Diagnosis present

## 2017-06-02 DIAGNOSIS — I739 Peripheral vascular disease, unspecified: Secondary | ICD-10-CM

## 2017-06-02 DIAGNOSIS — Z79899 Other long term (current) drug therapy: Secondary | ICD-10-CM | POA: Diagnosis not present

## 2017-06-02 DIAGNOSIS — E119 Type 2 diabetes mellitus without complications: Secondary | ICD-10-CM

## 2017-06-02 DIAGNOSIS — R059 Cough, unspecified: Secondary | ICD-10-CM

## 2017-06-02 DIAGNOSIS — S92341A Displaced fracture of fourth metatarsal bone, right foot, initial encounter for closed fracture: Secondary | ICD-10-CM | POA: Diagnosis not present

## 2017-06-02 DIAGNOSIS — E041 Nontoxic single thyroid nodule: Secondary | ICD-10-CM | POA: Diagnosis present

## 2017-06-02 DIAGNOSIS — E1152 Type 2 diabetes mellitus with diabetic peripheral angiopathy with gangrene: Secondary | ICD-10-CM | POA: Diagnosis present

## 2017-06-02 DIAGNOSIS — M86171 Other acute osteomyelitis, right ankle and foot: Secondary | ICD-10-CM | POA: Diagnosis not present

## 2017-06-02 DIAGNOSIS — R5082 Postprocedural fever: Secondary | ICD-10-CM | POA: Diagnosis not present

## 2017-06-02 DIAGNOSIS — F17210 Nicotine dependence, cigarettes, uncomplicated: Secondary | ICD-10-CM | POA: Diagnosis present

## 2017-06-02 DIAGNOSIS — R0600 Dyspnea, unspecified: Secondary | ICD-10-CM | POA: Diagnosis not present

## 2017-06-02 DIAGNOSIS — F17200 Nicotine dependence, unspecified, uncomplicated: Secondary | ICD-10-CM | POA: Diagnosis not present

## 2017-06-02 DIAGNOSIS — R05 Cough: Secondary | ICD-10-CM

## 2017-06-02 DIAGNOSIS — I70261 Atherosclerosis of native arteries of extremities with gangrene, right leg: Secondary | ICD-10-CM | POA: Diagnosis not present

## 2017-06-02 HISTORY — DX: Peripheral vascular disease, unspecified: I73.9

## 2017-06-02 HISTORY — DX: Type 2 diabetes mellitus without complications: E11.9

## 2017-06-02 HISTORY — DX: Type 2 diabetes mellitus with diabetic neuropathy, unspecified: E11.40

## 2017-06-02 HISTORY — DX: Pure hypercholesterolemia, unspecified: E78.00

## 2017-06-02 HISTORY — DX: Gangrene, not elsewhere classified: I96

## 2017-06-02 HISTORY — DX: Tobacco use: Z72.0

## 2017-06-02 LAB — SURGICAL PCR SCREEN
MRSA, PCR: NEGATIVE
STAPHYLOCOCCUS AUREUS: NEGATIVE

## 2017-06-02 LAB — GLUCOSE, CAPILLARY: Glucose-Capillary: 153 mg/dL — ABNORMAL HIGH (ref 65–99)

## 2017-06-02 MED ORDER — MORPHINE SULFATE (PF) 2 MG/ML IV SOLN
2.0000 mg | INTRAVENOUS | Status: DC | PRN
  Administered 2017-06-02 – 2017-06-03 (×2): 2 mg via INTRAVENOUS
  Filled 2017-06-02: qty 1

## 2017-06-02 MED ORDER — ACETAMINOPHEN 325 MG PO TABS
650.0000 mg | ORAL_TABLET | Freq: Four times a day (QID) | ORAL | Status: DC | PRN
  Administered 2017-06-05: 650 mg via ORAL

## 2017-06-02 MED ORDER — VANCOMYCIN HCL IN DEXTROSE 1-5 GM/200ML-% IV SOLN
1000.0000 mg | Freq: Two times a day (BID) | INTRAVENOUS | Status: DC
  Administered 2017-06-03 – 2017-06-05 (×5): 1000 mg via INTRAVENOUS
  Filled 2017-06-02 (×6): qty 200

## 2017-06-02 MED ORDER — GABAPENTIN 300 MG PO CAPS
600.0000 mg | ORAL_CAPSULE | Freq: Three times a day (TID) | ORAL | Status: DC
  Administered 2017-06-02 – 2017-06-17 (×45): 600 mg via ORAL
  Filled 2017-06-02 (×45): qty 2

## 2017-06-02 MED ORDER — ONDANSETRON HCL 4 MG PO TABS
4.0000 mg | ORAL_TABLET | Freq: Four times a day (QID) | ORAL | Status: DC | PRN
Start: 2017-06-02 — End: 2017-06-13

## 2017-06-02 MED ORDER — ATORVASTATIN CALCIUM 10 MG PO TABS
10.0000 mg | ORAL_TABLET | Freq: Every day | ORAL | Status: DC
  Administered 2017-06-02 – 2017-06-16 (×14): 10 mg via ORAL
  Filled 2017-06-02 (×15): qty 1

## 2017-06-02 MED ORDER — OXYCODONE HCL 5 MG PO TABS
5.0000 mg | ORAL_TABLET | ORAL | Status: DC | PRN
  Administered 2017-06-03 – 2017-06-14 (×44): 5 mg via ORAL
  Filled 2017-06-02 (×45): qty 1

## 2017-06-02 MED ORDER — INSULIN ASPART 100 UNIT/ML ~~LOC~~ SOLN
0.0000 [IU] | Freq: Three times a day (TID) | SUBCUTANEOUS | Status: DC
  Administered 2017-06-05: 5 [IU] via SUBCUTANEOUS
  Administered 2017-06-05 – 2017-06-06 (×3): 3 [IU] via SUBCUTANEOUS
  Administered 2017-06-06 – 2017-06-08 (×6): 2 [IU] via SUBCUTANEOUS
  Administered 2017-06-08 – 2017-06-09 (×3): 3 [IU] via SUBCUTANEOUS
  Administered 2017-06-09: 2 [IU] via SUBCUTANEOUS
  Administered 2017-06-10 (×3): 3 [IU] via SUBCUTANEOUS
  Administered 2017-06-11 (×2): 2 [IU] via SUBCUTANEOUS
  Administered 2017-06-11 – 2017-06-12 (×2): 3 [IU] via SUBCUTANEOUS
  Administered 2017-06-12: 2 [IU] via SUBCUTANEOUS
  Administered 2017-06-12 – 2017-06-15 (×6): 3 [IU] via SUBCUTANEOUS
  Administered 2017-06-15 – 2017-06-16 (×4): 2 [IU] via SUBCUTANEOUS
  Administered 2017-06-17: 3 [IU] via SUBCUTANEOUS
  Administered 2017-06-17: 2 [IU] via SUBCUTANEOUS

## 2017-06-02 MED ORDER — CEFAZOLIN SODIUM-DEXTROSE 1-4 GM/50ML-% IV SOLN
1.0000 g | INTRAVENOUS | Status: AC
  Filled 2017-06-02: qty 50

## 2017-06-02 MED ORDER — ONDANSETRON HCL 4 MG/2ML IJ SOLN: 4.0000 mg | Freq: Four times a day (QID) | INTRAMUSCULAR | Status: DC | PRN

## 2017-06-02 MED ORDER — ADULT MULTIVITAMIN W/MINERALS CH
1.0000 | ORAL_TABLET | Freq: Every day | ORAL | Status: DC
  Administered 2017-06-02 – 2017-06-17 (×16): 1 via ORAL
  Filled 2017-06-02 (×16): qty 1

## 2017-06-02 MED ORDER — SODIUM CHLORIDE 0.9% FLUSH
3.0000 mL | Freq: Two times a day (BID) | INTRAVENOUS | Status: DC
  Administered 2017-06-02 – 2017-06-16 (×20): 3 mL via INTRAVENOUS

## 2017-06-02 MED ORDER — SODIUM CHLORIDE 0.9% FLUSH: 3.0000 mL | INTRAVENOUS | Status: DC | PRN

## 2017-06-02 MED ORDER — SODIUM CHLORIDE 0.9% FLUSH: 3.0000 mL | Freq: Two times a day (BID) | INTRAVENOUS | Status: DC

## 2017-06-02 MED ORDER — ACETAMINOPHEN 650 MG RE SUPP: 650.0000 mg | Freq: Four times a day (QID) | RECTAL | Status: DC | PRN

## 2017-06-02 MED ORDER — DOCUSATE SODIUM 100 MG PO CAPS
100.0000 mg | ORAL_CAPSULE | Freq: Two times a day (BID) | ORAL | Status: DC
  Administered 2017-06-02 – 2017-06-06 (×9): 100 mg via ORAL
  Filled 2017-06-02 (×9): qty 1

## 2017-06-02 MED ORDER — INFLUENZA VAC SPLIT HIGH-DOSE 0.5 ML IM SUSY
0.5000 mL | PREFILLED_SYRINGE | INTRAMUSCULAR | Status: AC
  Administered 2017-06-05: 0.5 mL via INTRAMUSCULAR
  Filled 2017-06-02: qty 0.5

## 2017-06-02 MED ORDER — NICOTINE 14 MG/24HR TD PT24
14.0000 mg | MEDICATED_PATCH | Freq: Every day | TRANSDERMAL | Status: DC
  Administered 2017-06-02 – 2017-06-14 (×8): 14 mg via TRANSDERMAL
  Filled 2017-06-02 (×12): qty 1

## 2017-06-02 MED ORDER — PIPERACILLIN-TAZOBACTAM 3.375 G IVPB
3.3750 g | Freq: Three times a day (TID) | INTRAVENOUS | Status: DC
  Administered 2017-06-02 – 2017-06-05 (×6): 3.375 g via INTRAVENOUS
  Filled 2017-06-02 (×10): qty 50

## 2017-06-02 MED ORDER — SODIUM CHLORIDE 0.9 % IV SOLN: 250.0000 mL | INTRAVENOUS | Status: DC | PRN

## 2017-06-02 NOTE — H&P (Signed)
[facility] History and Physical  Adrian Bean GHW:[id number] DOB: February 03, 1951 DOA: 06/02/2017   PCP: Unknown Specialists: Previously seen by interventional radiology  Chief Complaint: Pain and swelling in the third toe on the right foot  HPI: Adrian Bean is a 66 y.o. male with a past medical history of diabetes mellitus type 2, peripheral neuropathy, history of peripheral artery disease, partial amputation of the left foot, tobacco abuse, has had a wound on his third right toe which has been followed by home health and outpatient providers.  On 11/29 the toe turned black.  There was associated pain and redness involving the right foot itself.  He did have some pain initially with bearing weight but does not denies any pain currently.  Had some chills but denies any fever.  Patient was admitted to [facility].  He was started on broad-spectrum antibiotics with vancomycin and Zosyn.  He underwent CT angiogram which showed revealed extensive bilateral right greater than left peripheral artery disease.  Case was discussed with vascular surgery and the patient was transferred over to [facility] for further management.  Patient denies any complaints currently.  No chest pain shortness of breath.  He denies any history of heart disease.  No history of lung disease although he smokes cigarettes.  Denies any history of kidney disease.   Home Medications: Prior to Admission medications   Medication Sig Start Date End Date Taking? Authorizing Provider  atorvastatin (LIPITOR) 10 MG tablet Take 10 mg by mouth daily at 6 PM.   Yes [provider]  gabapentin (NEURONTIN) 300 MG capsule Take 600 mg by mouth 3 (three) times daily.   Yes [provider]  glimepiride (AMARYL) 2 MG tablet Take 2 mg by mouth daily with breakfast.   Yes [provider]  lisinopril (PRINIVIL,ZESTRIL) 10 MG tablet Take 10 mg by mouth daily.   Yes [provider]  Multiple  Vitamin (MULTIVITAMIN WITH MINERALS) TABS tablet Take 1 tablet by mouth daily.   Yes [provider]  omega-3 acid ethyl esters (LOVAZA) 1 g capsule Take 1 g by mouth daily.   Yes [provider]    Allergies: No Known Allergies  Past Medical History: Past Medical History:  Diagnosis Date  . DM (diabetes mellitus), type 2 ([place]) 06/02/2017  . PAD (peripheral artery disease) ([place]) 06/02/2017  . Tobacco abuse 06/02/2017     Social History: Smokes at least half a pack of cigarettes on a daily basis.  Denies any alcohol use.  No recreational drug use.  Usually independent with daily activities.  Family History: History of high blood pressure and diabetes in the family.  Review of Systems - History obtained from the patient General ROS: negative Psychological ROS: negative Ophthalmic ROS: negative ENT ROS: negative Allergy and Immunology ROS: negative Hematological and Lymphatic ROS: negative Endocrine ROS: negative Respiratory ROS: no cough, shortness of breath, or wheezing Cardiovascular ROS: no chest pain or dyspnea on exertion Gastrointestinal ROS: no abdominal pain, change in bowel habits, or black or bloody stools Genito-Urinary ROS: no dysuria, trouble voiding, or hematuria Musculoskeletal ROS: negative Neurological ROS: no TIA or stroke symptoms Dermatological ROS: gangrene right toe  Physical Examination  Vitals:   06/02/17 1728  BP: (!) 152/76  Pulse: 71  Resp: 20  Temp: 98.7 F (37.1 C)  TempSrc: Oral  SpO2: 97%  Height: 6' (1.829 m)    BP (!) 152/76 (BP Location: Left Arm)   Pulse 71   Temp 98.7 F (37.1  C) (Oral)   Resp 20   Ht 6' (1.829 m)   SpO2 97%   General appearance: alert, cooperative, appears stated age and no distress Head: Normocephalic, without obvious abnormality, atraumatic Eyes: conjunctivae/corneas clear. PERRL, EOM's intact.  Throat: lips, mucosa, and tongue normal; teeth and gums normal Neck: no adenopathy, no carotid  bruit, no JVD, supple, symmetrical, trachea midline and thyroid not enlarged, symmetric, no tenderness/mass/nodules Resp: clear to auscultation bilaterally Cardio: regular rate and rhythm, S1, S2 normal, no murmur, click, rub or gallop GI: soft, non-tender; bowel sounds normal; no masses,  no organomegaly Extremities: Gangrenous changes third toe right foot Pulses: poorly palpable in lower extremities Skin: Chronic skin changes in the lower extremity.  Gangrenous changes noted in the third toe on the right foot. Lymph nodes: Cervical, supraclavicular, and axillary nodes normal. Neurologic: Awake alert.  Cranial nerves II through XII intact.  Motor strength equal bilateral upper and lower extremities.    Labs on Admission: I have personally reviewed following labs and imaging studies   Lab results from outside facility reviewed. 12/3: WBC 12.9.  Hemoglobin 10.7.  Platelet count 183.  BUN 24 creatinine 1.2. sodium 133. bicarb 28. chloride 101. potassium 4.7. glucose 91. calcium 8.3.   Radiological Exams on Admission: Patient underwent x-ray of the right foot at the outside facility which showed active soft tissue infection of the third toe with soft tissue gas present.  Radiographic evidence for probable osteomyelitis involving the same digit.  CT angiogram showed extensive bilateral peripheral artery disease right worse than left.  Problem List  Principal Problem:   Gangrene of toe of right foot ([place]) Active Problems:   PAD (peripheral artery disease) ([facility])   Sepsis ([place])   DM (diabetes mellitus), type 2 ([facility])   Tobacco abuse   Assessment: 66 year old Caucasian male with past medical history as stated earlier who presents with changes suggestive of gangrene involving the third toe on the right foot.  Evaluation has revealed significant peripheral artery disease in the lower right lower extremity.  Patient will need to undergo vascular procedure.  Plan: #1 right third toe  gangrene/artery disease/cellulitis involving the right foot/sepsis: He was noted to be septic when he was initially hospitalized to [facility].  Sepsis physiology appears to have resolved.  Imaging studies showed gas in the third toe.  Patient was placed on vancomycin and Zosyn over there.  Blood cultures were apparently without any growth.  Patient will be continued on vancomycin and Zosyn here until he undergoes surgery.  Vascular surgery has been consulted.  They are planning to do a right lower extremity arteriogram on 12/4.  Pain control.  Preop EKG.  #2 Diabetes mellitus type 2 with diabetic neuropathy: Continue sliding scale insulin coverage.  He does not use insulin at home.  He is only on oral agents.  No recent HbA1c in our system.  We will order one for tomorrow.  Continue gabapentin for diabetic neuropathy.  Also noted to be on ACE inhibitor and statin.  #3  Tobacco abuse: Counseled.  Nicotine patch.  #4  Incidental finding of a focal wall thickening at the hepatic flexure of the colon was noted on the CT scan.  This may need further evaluation with colonoscopy as an outpatient.    #5 previous history of partial left foot amputation.  In July he underwent arteriogram of the left lower extremity and underwent PTCA of the SFA with stent placement.  This was done by interventional radiology.  It appears that  patient has 2 different medical record numbers in our system.  The other medical record number is [id number].  DVT Prophylaxis: Since he will be undergoing procedure tomorrow we will not give him any pharmacological prophylaxis.  This can be initiated tomorrow.  Due to PAD will avoid SCDs for now Code Status: Full code Family Communication: Discussed with patient Consults called: Vascular surgery  Severity of Illness: The appropriate patient status for this patient is INPATIENT. Inpatient status is judged to be reasonable and necessary in order to provide the required intensity  of service to ensure the patient's safety. The patient's presenting symptoms, physical exam findings, and initial radiographic and laboratory data in the context of their chronic comorbidities is felt to place them at high risk for further clinical deterioration. Furthermore, it is not anticipated that the patient will be medically stable for discharge from the hospital within 2 midnights of admission. The following factors support the patient status of inpatient.   " The patient's presenting symptoms include right foot pain. " The worrisome physical exam findings include gangrene involving the 3rd toe in right foot. " The initial radiographic and laboratory data are worrisome because of peripheral vascular disease. " The chronic co-morbidities include diabetes mellitus type 2.   * I certify that at the point of admission it is my clinical judgment that the patient will require inpatient hospital care spanning beyond 2 midnights from the point of admission due to high intensity of service, high risk for further deterioration and high frequency of surveillance required.*  Further management decisions will depend on results of further testing and patient's response to treatment.   Bonnielee Haff  [facility] Pager [phone]  If 7PM-7AM, please contact night-coverage [email or web address] Password [email or web address]  06/02/2017, 5:51 PM

## 2017-06-02 NOTE — Progress Notes (Signed)
Pharmacy Antibiotic Note  Adrian Bean is a 66 y.o. male transferred from [facility] to [facility] on 06/02/17.  Pharmacy has been consulted for Vancomycin and Zosyn dosing for wound infection/gangrene right 3rd toe. For arteriogram on 12/4.    Vanc and Zosyn begun at [facility] on 06/01/17.  RN reports Vanc dose was infusing on transport.  Vanc 2gm IV bag on IV pole with due time of 2pm today. Prior Vanc 2 gm IV dose given on 12/2 at ~4pm per records. Records indicate last Zosyn dose given at 10am today.  Plan:  Zosyn 3.375 gm IV q8hrs (each over 4 hours) to begin tonight.  Will continue Vancomycin with 1gm IV q12hrs - begin on 12/4 at 6am.  Follow renal function, progress and antibiotic plans.  Height: 6' (182.9 cm) Weight: 177 lb (80.3 kg) IBW/kg (Calculated) : 77.6  Temp (24hrs), Avg:98.7 F (37.1 C), Min:98.7 F (37.1 C), Max:98.7 F (37.1 C)  Labs from [place]:    12/2:  Creatinine 1.0, WBC 14.6  12/3:  Creatinine 1.2, WBC 12.9     No Known Allergies  Antimicrobials this admission:   Vancomcyin 12/2 at [facility]>>   Zosyn 12/2 at [facility]>>  Dose adjustments this admission:   n/a  Microbiology results:  none  Thank you for allowing pharmacy to be a part of this patient's care.  Arty Baumgartner, [place] Pager: [phone] 06/02/2017 7:18 PM

## 2017-06-02 NOTE — H&P (View-Only) (Signed)
Hospital Consult    Reason for Consult:  Gangrene L foot Requesting Physician:  [facility] MRN #:  [id number]  History of Present Illness: This is a 66 y.o. male seen in consultation for gangrene of right third toe with known right SFA occlusion by CTA.  Patient was transferred from [facility] today for vascular surgery consultation due to a nonhealing right third gangrenous toe in the presence of peripheral arterial disease.  Past medical history also significant for diabetes mellitus as well as a smoking.  Surgical history significant for left fourth and fifth ray amputation and angioplasty of left iliac artery within the past year which has since healed.  Patient states he has had tremendous pain in his right foot and believes this is his neuropathy.  Patient denies subjective fevers, chills, nausea/vomiting.  No past medical history on file.    Allergies not on file  Prior to Admission medications   Not on File    Social History   Socioeconomic History  . Marital status: Not on file    Spouse name: Not on file  . Number of children: Not on file  . Years of education: Not on file  . Highest education level: Not on file  Social Needs  . Financial resource strain: Not on file  . Food insecurity - worry: Not on file  . Food insecurity - inability: Not on file  . Transportation needs - medical: Not on file  . Transportation needs - non-medical: Not on file  Occupational History  . Not on file  Tobacco Use  . Smoking status: Not on file  Substance and Sexual Activity  . Alcohol use: Not on file  . Drug use: Not on file  . Sexual activity: Not on file  Other Topics Concern  . Not on file  Social History Narrative  . Not on file     No family history on file.  ROS: Otherwise negative unless mentioned in HPI  Physical Examination  There were no vitals filed for this visit. There is no height or weight on file to calculate BMI.  General:  WDWN in NAD Gait: Not  observed HENT: WNL, normocephalic Pulmonary: normal non-labored breathing, without Rales, rhonchi,  wheezing Cardiac: regular, without  Murmurs, rubs or gallops; without carotid bruits Abdomen: soft, NT/ND, no masses Skin: without rashes Vascular Exam/Pulses: palpable popliteal pulses L>R; symmetrical femoral pulses; palpable L PT pulse 1+ Extremities: with ischemic changes, with Gangrene , with cellulitis R forefoot; without open wounds; healed L 4,5 ray amp; R 3rd toe dry gangrene with erythema of R forefoot; cracking skin B feet; stasis pigmentation and varicosities B lower legs Musculoskeletal: no muscle wasting or atrophy  Neurologic: A&O X 3;  No focal weakness or paresthesias are detected; speech is fluent/normal Psychiatric:  The pt has Normal affect. Lymph:  Unremarkable  CBC No results found for: WBC, RBC, HGB, HCT, PLT, MCV, MCH, MCHC, RDW, LYMPHSABS, MONOABS, EOSABS, BASOSABS  BMET No results found for: NA, K, CL, CO2, GLUCOSE, BUN, CREATININE, CALCIUM, GFRNONAA, GFRAA  COAGS: No results found for: INR, PROTIME   ASSESSMENT/PLAN: This is a 66 y.o. male with gangrenous right third toe in the presence of right SFA occlusion by CTA  -Dr. Donnetta Hutching was involved in the evaluation and management plan of this patient -Plan is for right lower extremity arteriogram with possible revascularization tomorrow 12/4 -If unable to adequately revascularize right lower extremity patient is tentatively scheduled for right femoral to popliteal bypass surgery with right third  toe amputation on Wednesday 12/5 -Patient is agreeable to this plan and endorses understanding -N.p.o. after midnight -Vein map will be needed pending results of right lower extremity arteriogram   Dagoberto Ligas PA-C [facility] [phone]

## 2017-06-02 NOTE — Consult Note (Signed)
Hospital Consult    Reason for Consult:  Gangrene L foot Requesting Physician:  [facility] MRN #:  [id number]  History of Present Illness: This is a 66 y.o. male seen in consultation for gangrene of right third toe with known right SFA occlusion by CTA.  Patient was transferred from [facility] today for vascular surgery consultation due to a nonhealing right third gangrenous toe in the presence of peripheral arterial disease.  Past medical history also significant for diabetes mellitus as well as a smoking.  Surgical history significant for left fourth and fifth ray amputation and angioplasty of left iliac artery within the past year which has since healed.  Patient states he has had tremendous pain in his right foot and believes this is his neuropathy.  Patient denies subjective fevers, chills, nausea/vomiting.  No past medical history on file.    Allergies not on file  Prior to Admission medications   Not on File    Social History   Socioeconomic History  . Marital status: Not on file    Spouse name: Not on file  . Number of children: Not on file  . Years of education: Not on file  . Highest education level: Not on file  Social Needs  . Financial resource strain: Not on file  . Food insecurity - worry: Not on file  . Food insecurity - inability: Not on file  . Transportation needs - medical: Not on file  . Transportation needs - non-medical: Not on file  Occupational History  . Not on file  Tobacco Use  . Smoking status: Not on file  Substance and Sexual Activity  . Alcohol use: Not on file  . Drug use: Not on file  . Sexual activity: Not on file  Other Topics Concern  . Not on file  Social History Narrative  . Not on file     No family history on file.  ROS: Otherwise negative unless mentioned in HPI  Physical Examination  There were no vitals filed for this visit. There is no height or weight on file to calculate BMI.  General:  WDWN in NAD Gait: Not  observed HENT: WNL, normocephalic Pulmonary: normal non-labored breathing, without Rales, rhonchi,  wheezing Cardiac: regular, without  Murmurs, rubs or gallops; without carotid bruits Abdomen: soft, NT/ND, no masses Skin: without rashes Vascular Exam/Pulses: palpable popliteal pulses L>R; symmetrical femoral pulses; palpable L PT pulse 1+ Extremities: with ischemic changes, with Gangrene , with cellulitis R forefoot; without open wounds; healed L 4,5 ray amp; R 3rd toe dry gangrene with erythema of R forefoot; cracking skin B feet; stasis pigmentation and varicosities B lower legs Musculoskeletal: no muscle wasting or atrophy  Neurologic: A&O X 3;  No focal weakness or paresthesias are detected; speech is fluent/normal Psychiatric:  The pt has Normal affect. Lymph:  Unremarkable  CBC No results found for: WBC, RBC, HGB, HCT, PLT, MCV, MCH, MCHC, RDW, LYMPHSABS, MONOABS, EOSABS, BASOSABS  BMET No results found for: NA, K, CL, CO2, GLUCOSE, BUN, CREATININE, CALCIUM, GFRNONAA, GFRAA  COAGS: No results found for: INR, PROTIME   ASSESSMENT/PLAN: This is a 66 y.o. male with gangrenous right third toe in the presence of right SFA occlusion by CTA  -Dr. Donnetta Hutching was involved in the evaluation and management plan of this patient -Plan is for right lower extremity arteriogram with possible revascularization tomorrow 12/4 -If unable to adequately revascularize right lower extremity patient is tentatively scheduled for right femoral to popliteal bypass surgery with right third  toe amputation on Wednesday 12/5 -Patient is agreeable to this plan and endorses understanding -N.p.o. after midnight -Vein map will be needed pending results of right lower extremity arteriogram   Dagoberto Ligas PA-C [facility] [phone]

## 2017-06-02 NOTE — Progress Notes (Signed)
Adrian Bean is a 66 y.o. male patient admitted from ED awake, alert - oriented  X 4 - no acute distress noted.  VSS - There were no vitals taken for this visit.    IV in place, occlusive dsg intact without redness.  Orientation to room, and floor completed with information packet given to patient/family.  Patient declined safety video at this time.  Admission INP armband ID verified with patient/family, and in place.   SR up x 2, fall assessment complete, with patient and family able to verbalize understanding of risk associated with falls, and verbalized understanding to call nsg before up out of bed.  Call light within reach, patient able to voice, and demonstrate understanding.  Pt has black 3rd toe on right foot. Skin is otherwise clean-dry- intact without evidence of bruising, or skin tears.   No evidence of skin break down noted on exam.     Will cont to eval and treat per MD orders.  Celine Ahr, RN 06/02/2017 4:24 PM

## 2017-06-03 ENCOUNTER — Other Ambulatory Visit: Payer: Self-pay | Admitting: *Deleted

## 2017-06-03 ENCOUNTER — Encounter (HOSPITAL_COMMUNITY): Payer: Self-pay

## 2017-06-03 ENCOUNTER — Ambulatory Visit (HOSPITAL_COMMUNITY): Admit: 2017-06-03 | Payer: Medicare Other | Admitting: Vascular Surgery

## 2017-06-03 ENCOUNTER — Encounter (HOSPITAL_COMMUNITY)
Admission: AD | Disposition: A | Discharge status: Skilled Nursing Facility | Payer: Self-pay | Source: Other Acute Inpatient Hospital | Attending: Internal Medicine

## 2017-06-03 DIAGNOSIS — A419 Sepsis, unspecified organism: Secondary | ICD-10-CM

## 2017-06-03 DIAGNOSIS — I70261 Atherosclerosis of native arteries of extremities with gangrene, right leg: Secondary | ICD-10-CM

## 2017-06-03 HISTORY — PX: LOWER EXTREMITY ANGIOGRAPHY: CATH118251

## 2017-06-03 HISTORY — PX: ABDOMINAL AORTOGRAM: CATH118222

## 2017-06-03 LAB — BASIC METABOLIC PANEL
ANION GAP: 12 (ref 5–15)
BUN: 23 mg/dL — AB (ref 6–20)
CALCIUM: 8.5 mg/dL — AB (ref 8.9–10.3)
CO2: 24 mmol/L (ref 22–32)
CREATININE: 1.39 mg/dL — AB (ref 0.61–1.24)
Chloride: 98 mmol/L — ABNORMAL LOW (ref 101–111)
GFR calc Af Amer: 59 mL/min — ABNORMAL LOW (ref >60)
GFR, EST NON AFRICAN AMERICAN: 51 mL/min — AB (ref >60)
GLUCOSE: 138 mg/dL — AB (ref 65–99)
Potassium: 4.1 mmol/L (ref 3.5–5.1)
Sodium: 134 mmol/L — ABNORMAL LOW (ref 135–145)

## 2017-06-03 LAB — GLUCOSE, CAPILLARY
GLUCOSE-CAPILLARY: 115 mg/dL — AB (ref 65–99)
Glucose-Capillary: 138 mg/dL — ABNORMAL HIGH (ref 65–99)
Glucose-Capillary: 132 mg/dL — ABNORMAL HIGH (ref 65–99)
Glucose-Capillary: 201 mg/dL — ABNORMAL HIGH (ref 65–99)

## 2017-06-03 LAB — CBC
HEMATOCRIT: 32.8 % — AB (ref 39.0–52.0)
Hemoglobin: 10.9 g/dL — ABNORMAL LOW (ref 13.0–17.0)
MCH: 31.1 pg (ref 26.0–34.0)
MCHC: 33.2 g/dL (ref 30.0–36.0)
MCV: 93.4 fL (ref 78.0–100.0)
PLATELETS: 202 10*3/uL (ref 150–400)
RBC: 3.51 MIL/uL — ABNORMAL LOW (ref 4.22–5.81)
RDW: 12.9 % (ref 11.5–15.5)
WBC: 15.7 10*3/uL — ABNORMAL HIGH (ref 4.0–10.5)

## 2017-06-03 LAB — HEMOGLOBIN A1C
HEMOGLOBIN A1C: 6.3 % — AB (ref 4.8–5.6)
Mean Plasma Glucose: 134.11 mg/dL

## 2017-06-03 LAB — PROTIME-INR
INR: 1.18
Prothrombin Time: 14.9 seconds (ref 11.4–15.2)

## 2017-06-03 SURGERY — ABDOMINAL AORTOGRAM
Anesthesia: LOCAL

## 2017-06-03 SURGERY — ABDOMINAL AORTOGRAM W/LOWER EXTREMITY
Anesthesia: LOCAL

## 2017-06-03 MED ORDER — FAMOTIDINE IN NACL 20-0.9 MG/50ML-% IV SOLN
INTRAVENOUS | Status: AC | PRN
  Administered 2017-06-03: 20 mL via INTRAVENOUS

## 2017-06-03 MED ORDER — HYDRALAZINE HCL 20 MG/ML IJ SOLN: 5.0000 mg | INTRAMUSCULAR | Status: DC | PRN

## 2017-06-03 MED ORDER — SODIUM CHLORIDE 0.9 % WEIGHT BASED INFUSION: 1.0000 mL/kg/h | INTRAVENOUS | Status: AC

## 2017-06-03 MED ORDER — HEPARIN (PORCINE) IN NACL 2-0.9 UNIT/ML-% IJ SOLN
INTRAMUSCULAR | Status: AC
  Filled 2017-06-03: qty 1000

## 2017-06-03 MED ORDER — MIDAZOLAM HCL 2 MG/2ML IJ SOLN
INTRAMUSCULAR | Status: AC
  Filled 2017-06-03: qty 2

## 2017-06-03 MED ORDER — ENOXAPARIN SODIUM 30 MG/0.3ML ~~LOC~~ SOLN
30.0000 mg | SUBCUTANEOUS | Status: DC
  Administered 2017-06-05: 30 mg via SUBCUTANEOUS
  Filled 2017-06-03 (×2): qty 0.3

## 2017-06-03 MED ORDER — MORPHINE SULFATE (PF) 4 MG/ML IV SOLN
INTRAVENOUS | Status: AC
  Filled 2017-06-03: qty 1

## 2017-06-03 MED ORDER — IODIXANOL 320 MG/ML IV SOLN
INTRAVENOUS | Status: DC | PRN
  Administered 2017-06-03: 95 mL via INTRAVENOUS

## 2017-06-03 MED ORDER — LABETALOL HCL 5 MG/ML IV SOLN: 10.0000 mg | INTRAVENOUS | Status: DC | PRN

## 2017-06-03 MED ORDER — PANTOPRAZOLE SODIUM 40 MG PO TBEC
40.0000 mg | DELAYED_RELEASE_TABLET | Freq: Every day | ORAL | Status: DC
  Administered 2017-06-03 – 2017-06-17 (×13): 40 mg via ORAL
  Filled 2017-06-03 (×14): qty 1

## 2017-06-03 MED ORDER — FENTANYL CITRATE (PF) 100 MCG/2ML IJ SOLN
INTRAMUSCULAR | Status: DC | PRN
  Administered 2017-06-03: 50 ug via INTRAVENOUS

## 2017-06-03 MED ORDER — SODIUM CHLORIDE 0.9% FLUSH: 3.0000 mL | Freq: Two times a day (BID) | INTRAVENOUS | Status: DC

## 2017-06-03 MED ORDER — FAMOTIDINE IN NACL 20-0.9 MG/50ML-% IV SOLN
INTRAVENOUS | Status: AC
  Filled 2017-06-03: qty 50

## 2017-06-03 MED ORDER — FENTANYL CITRATE (PF) 100 MCG/2ML IJ SOLN
INTRAMUSCULAR | Status: AC
  Filled 2017-06-03: qty 2

## 2017-06-03 MED ORDER — SODIUM CHLORIDE 0.9% FLUSH: 3.0000 mL | INTRAVENOUS | Status: DC | PRN

## 2017-06-03 MED ORDER — SODIUM CHLORIDE 0.9 % IV SOLN: 250.0000 mL | INTRAVENOUS | Status: DC | PRN

## 2017-06-03 MED ORDER — MIDAZOLAM HCL 2 MG/2ML IJ SOLN
INTRAMUSCULAR | Status: DC | PRN
  Administered 2017-06-03: 1 mg via INTRAVENOUS

## 2017-06-03 MED ORDER — LIDOCAINE HCL (PF) 1 % IJ SOLN
INTRAMUSCULAR | Status: DC | PRN
  Administered 2017-06-03: 15 mL

## 2017-06-03 MED ORDER — ASPIRIN EC 81 MG PO TBEC
81.0000 mg | DELAYED_RELEASE_TABLET | Freq: Every day | ORAL | Status: DC
  Administered 2017-06-03 – 2017-06-17 (×15): 81 mg via ORAL
  Filled 2017-06-03 (×16): qty 1

## 2017-06-03 MED ORDER — SODIUM CHLORIDE 0.9 % IV SOLN
INTRAVENOUS | Status: DC
  Administered 2017-06-03 – 2017-06-06 (×4): via INTRAVENOUS

## 2017-06-03 MED ORDER — LIDOCAINE HCL (PF) 1 % IJ SOLN
INTRAMUSCULAR | Status: AC
  Filled 2017-06-03: qty 30

## 2017-06-03 MED ORDER — HEPARIN (PORCINE) IN NACL 2-0.9 UNIT/ML-% IJ SOLN
INTRAMUSCULAR | Status: AC | PRN
  Administered 2017-06-03: 1000 mL

## 2017-06-03 SURGICAL SUPPLY — 11 items
CATH OMNI FLUSH 5F 65CM (CATHETERS) ×3 IMPLANT
CATH STRAIGHT 5FR 65CM (CATHETERS) ×3 IMPLANT
COVER PRB 48X5XTLSCP FOLD TPE (BAG) ×2 IMPLANT
COVER PROBE 5X48 (BAG) ×1
GUIDEWIRE ANGLED .035X260CM (WIRE) ×3 IMPLANT
KIT PV (KITS) ×3 IMPLANT
SHEATH PINNACLE 5F 10CM (SHEATH) ×3 IMPLANT
SYR MEDRAD MARK V 150ML (SYRINGE) ×3 IMPLANT
TRANSDUCER W/STOPCOCK (MISCELLANEOUS) ×3 IMPLANT
TRAY PV CATH (CUSTOM PROCEDURE TRAY) ×3 IMPLANT
WIRE BENTSON .035X145CM (WIRE) ×3 IMPLANT

## 2017-06-03 NOTE — H&P (Signed)
   History and Physical Update  The patient was interviewed and re-examined.  The patient's previous History and Physical has been reviewed and is unchanged from Dr. Donzetta Matters and [facility] consult.  There is no change in the plan of care: aortogram, right leg runoff.   I discussed with the patient the nature of angiographic procedures, especially the limited patencies of any endovascular intervention.    The patient is aware of that the risks of an angiographic procedure include but are not limited to: bleeding, infection, access site complications, renal failure, embolization, rupture of vessel, dissection, arteriovenous fistula, possible need for emergent surgical intervention, possible need for surgical procedures to treat the patient's pathology, anaphylactic reaction to contrast, and stroke and death.    The patient is aware of the risks and agrees to proceed.   Adele Barthel, MD, FACS [facility] [place] Office: [phone] Pager: [phone]  06/03/2017, 11:50 AM

## 2017-06-03 NOTE — Progress Notes (Signed)
PROGRESS NOTE    Adrian Bean  CHE:[id number] DOB: [birth year] DOA: 06/02/2017 PCP: [name], MD   No chief complaint on file.  Brief Narrative:  HPI on 06/02/2017 by Dr. Bonnielee Haff  Adrian Bean is a 66 y.o. male with a past medical history of diabetes mellitus type 2, peripheral neuropathy, history of peripheral artery disease, partial amputation of the left foot, tobacco abuse, has had a wound on his third right toe which has been followed by home health and outpatient providers.  On 11/29 the toe turned black.  There was associated pain and redness involving the right foot itself.  He did have some pain initially with bearing weight but does not denies any pain currently.  Had some chills but denies any fever.  Patient was admitted to [facility].  He was started on broad-spectrum antibiotics with vancomycin and Zosyn.  He underwent CT angiogram which showed revealed extensive bilateral right greater than left peripheral artery disease.  Case was discussed with vascular surgery and the patient was transferred over to [facility] for further management.  Patient denies any complaints currently.  No chest pain shortness of breath.  He denies any history of heart disease.  No history of lung disease although he smokes cigarettes.  Denies any history of kidney disease.  Assessment & Plan   Right third toe gangrene/arterial disease/cellulitis -Continue vancomycin and Zosyn -Vascular surgery consulted and appreciated -plan for angiogram today with possibly bypass on 12/5  Sepsis -Secondary to the above. Resolved at [facility]  Diabetes mellitus, type II with diabetic neuropathy -Continue insulin sliding-scale with CBG monitoring -Patient does not have any insulin use at home, only on oral agents -Hemoglobin A1c 6.3 -Continue gabapentin  Tobacco abuse -Discussed Cessation -Continue nicotine patch  Incidental finding  -of a focal wall thickening at  the hepatic flexure of the colon was noted on the CT scan.   -may need further evaluation with colonoscopy as an outpatient  Previous partial left foot amputation -Underwent arteriorgram of LLE and PTCA of the SFA with stent placement by IR in July 2018  DVT Prophylaxis  SCDs  Code Status: Full  Family Communication: None at bedside  Disposition Plan: admitted  Consultants Vascular surgery  Procedures  none  Antibiotics   Anti-infectives (From admission, onward)   Start     Dose/Rate Route Frequency Ordered Stop   06/03/17 0800  [MAR Hold]  ceFAZolin (ANCEF) IVPB 1 g/50 mL premix     (MAR Hold since 06/03/17 1149)   1 g 100 mL/hr over 30 Minutes Intravenous On call 06/02/17 1704 06/04/17 0800   06/03/17 0600  [MAR Hold]  vancomycin (VANCOCIN) IVPB 1000 mg/200 mL premix     (MAR Hold since 06/03/17 1149)   1,000 mg 200 mL/hr over 60 Minutes Intravenous Every 12 hours 06/02/17 1917     06/02/17 2000  [MAR Hold]  piperacillin-tazobactam (ZOSYN) IVPB 3.375 g     (MAR Hold since 06/03/17 1149)   3.375 g 12.5 mL/hr over 240 Minutes Intravenous Every 8 hours 06/02/17 1912        Subjective:   Adrian Bean seen and examined today.  Wants to get his procedure over with. Denies chest pain, shortness of breath, abdominal pain, N/V/D/C.    Objective:   Vitals:   06/03/17 1226 06/03/17 1231 06/03/17 1236 06/03/17 1241  BP: (!) 141/85     Pulse: 84 (!) 116 (!) 0 (!) 0  Resp: 17 16 (!) 0 (!) 0  Temp:      TempSrc:      SpO2: 95% (!) 0% (!) 0% (!) 0%  Weight:      Height:        Intake/Output Summary (Last 24 hours) at 06/03/2017 1302 Last data filed at 06/03/2017 6644 Gross per 24 hour  Intake 100 ml  Output 925 ml  Net -825 ml   Filed Weights   06/02/17 1830  Weight: 80.3 kg (177 lb)    Exam  General: Well developed, well nourished, NAD, appears stated age  HEENT: NCAT, mucous membranes moist.   Cardiovascular: S1 S2 auscultated, RRR, no  murmur  Respiratory: Clear to auscultation bilaterally with equal chest rise  Abdomen: Soft, nontender, nondistended, + bowel sounds  Extremities: warm dry without cyanosis clubbing or edema. Gangrenous right 3rd toe. Chronic skin changes on LE   Neuro: AAOx3, nonfocal  Psych: Normal affect and demeanor, pleasant  Data Reviewed: I have personally reviewed following labs and imaging studies  CBC: Recent Labs  Lab 06/03/17 0422  WBC 15.7*  HGB 10.9*  HCT 32.8*  MCV 93.4  PLT 034   Basic Metabolic Panel: Recent Labs  Lab 06/03/17 0422  NA 134*  K 4.1  CL 98*  CO2 24  GLUCOSE 138*  BUN 23*  CREATININE 1.39*  CALCIUM 8.5*   GFR: Estimated Creatinine Clearance: 55.8 mL/min (A) (by C-G formula based on SCr of 1.39 mg/dL (H)). Liver Function Tests: No results for input(s): AST, ALT, ALKPHOS, BILITOT, PROT, ALBUMIN in the last 168 hours. No results for input(s): LIPASE, AMYLASE in the last 168 hours. No results for input(s): AMMONIA in the last 168 hours. Coagulation Profile: Recent Labs  Lab 06/03/17 0422  INR 1.18   Cardiac Enzymes: No results for input(s): CKTOTAL, CKMB, CKMBINDEX, TROPONINI in the last 168 hours. BNP (last 3 results) No results for input(s): PROBNP in the last 8760 hours. HbA1C: Recent Labs    06/03/17 0422  HGBA1C 6.3*   CBG: Recent Labs  Lab 06/02/17 2113 06/03/17 0743  GLUCAP 153* 138*   Lipid Profile: No results for input(s): CHOL, HDL, LDLCALC, TRIG, CHOLHDL, LDLDIRECT in the last 72 hours. Thyroid Function Tests: No results for input(s): TSH, T4TOTAL, FREET4, T3FREE, THYROIDAB in the last 72 hours. Anemia Panel: No results for input(s): VITAMINB12, FOLATE, FERRITIN, TIBC, IRON, RETICCTPCT in the last 72 hours. Urine analysis: No results found for: COLORURINE, APPEARANCEUR, LABSPEC, PHURINE, GLUCOSEU, HGBUR, BILIRUBINUR, KETONESUR, PROTEINUR, UROBILINOGEN, NITRITE, LEUKOCYTESUR Sepsis  Labs: @LABRCNTIP (procalcitonin:4,lacticidven:4)  ) Recent Results (from the past 240 hour(s))  Surgical pcr screen     Status: None   Collection Time: 06/02/17  8:08 PM  Result Value Ref Range Status   MRSA, PCR NEGATIVE NEGATIVE Final   Staphylococcus aureus NEGATIVE NEGATIVE Final    Comment: (NOTE) The Xpert SA Assay (FDA approved for NASAL specimens in patients 63 years of age and older), is one component of a comprehensive surveillance program. It is not intended to diagnose infection nor to guide or monitor treatment.       Radiology Studies: No results found.   Scheduled Meds: . [MAR Hold] atorvastatin  10 mg Oral q1800  . [MAR Hold] docusate sodium  100 mg Oral BID  . [MAR Hold] gabapentin  600 mg Oral TID  . [MAR Hold] Influenza vac split quadrivalent PF  0.5 mL Intramuscular Tomorrow-1000  . [MAR Hold] insulin aspart  0-15 Units Subcutaneous TID WC  . [MAR Hold] multivitamin with minerals  1 tablet Oral  Daily  . [MAR Hold] nicotine  14 mg Transdermal Daily  . [MAR Hold] sodium chloride flush  3 mL Intravenous Q12H  . [MAR Hold] sodium chloride flush  3 mL Intravenous Q12H   Continuous Infusions: . [MAR Hold] sodium chloride    . sodium chloride    . [MAR Hold]  ceFAZolin (ANCEF) IV    . [MAR Hold] piperacillin-tazobactam (ZOSYN)  IV Stopped (06/03/17 0751)  . [MAR Hold] vancomycin Stopped (06/03/17 0752)     LOS: 1 day   Time Spent in minutes   30 minutes  Demeka Sutter D.O. on 06/03/2017 at 1:02 PM  Between 7am to 7pm - Pager - [phone]  After 7pm go to [email or web address] - password [email or web address]  And look for the night coverage person covering for me after hours  [facility] Office  [phone]

## 2017-06-03 NOTE — Progress Notes (Signed)
Pt will not be returned to [id number].

## 2017-06-03 NOTE — Progress Notes (Signed)
  Progress Note    06/03/2017 9:46 AM * No surgery date entered *  Subjective:  Complains of right foot pain  Vitals:   06/02/17 2111 06/03/17 0523  BP: (!) 141/65 136/69  Pulse: 73 81  Resp: 16 16  Temp: 98.8 F (37.1 C) 98.7 F (37.1 C)  SpO2: 92% 91%    Physical Exam: aaox3 Palpable femoral pulses bilaterally Dry gangrene of right 3rd toe  CBC    Component Value Date/Time   WBC 15.7 (H) 06/03/2017 0422   RBC 3.51 (L) 06/03/2017 0422   HGB 10.9 (L) 06/03/2017 0422   HCT 32.8 (L) 06/03/2017 0422   PLT 202 06/03/2017 0422   MCV 93.4 06/03/2017 0422   MCH 31.1 06/03/2017 0422   MCHC 33.2 06/03/2017 0422   RDW 12.9 06/03/2017 0422    BMET    Component Value Date/Time   NA 134 (L) 06/03/2017 0422   K 4.1 06/03/2017 0422   CL 98 (L) 06/03/2017 0422   CO2 24 06/03/2017 0422   GLUCOSE 138 (H) 06/03/2017 0422   BUN 23 (H) 06/03/2017 0422   CREATININE 1.39 (H) 06/03/2017 0422   CALCIUM 8.5 (L) 06/03/2017 0422   GFRNONAA 51 (L) 06/03/2017 0422   GFRAA 59 (L) 06/03/2017 0422    INR    Component Value Date/Time   INR 1.18 06/03/2017 0422     Intake/Output Summary (Last 24 hours) at 06/03/2017 0946 Last data filed at 06/03/2017 0800 Gross per 24 hour  Intake 100 ml  Output 825 ml  Net -725 ml     Assessment:  66 y.o. male is here with dry gangrene of right 3rd toe.  Plan: Angiogram today with likely bypass tomorrow with Dr. Caralyn Guile C. Donzetta Matters, MD [facility] [place] Office: [phone] Pager: [phone]  06/03/2017 9:46 AM

## 2017-06-03 NOTE — Op Note (Signed)
OPERATIVE NOTE   PROCEDURE: 1.  Left common femoral artery cannulation under ultrasound guidance 2.  Placement of catheter in aorta 3.  Aortogram 4.  Second order arterial selection 5.  Right leg runoff via catheter 6.  Left leg runoff via sheath 7.  Conscious sedation for 30 minutes  PRE-OPERATIVE DIAGNOSIS: right foot gangrene  POST-OPERATIVE DIAGNOSIS: same as above   SURGEON: Adele Barthel, MD  ANESTHESIA: conscious sedation  ESTIMATED BLOOD LOSS: 50 cc  CONTRAST: 99 cc  FINDING(S):  Aorta: patent  Superior mesenteric artery: not visualized Celiac artery: incompletely visualized, proximal pain   Right Left  RA Proximal patent, incomplete visualization Patent  CIA patent Patent, patent stent  EIA Patent, diseased <30% diffuse stenosis Patent, patent stent  IIA Patent, 50-75% proximal stenosis Patent  CFA Patent Patent  SFA Near flush occlusion, reconstitutes distally Patent  PFA Patent, extensive collaterals reconstitute distal SFA Patent  Pop Patent, reconstitutes from significant profunda collaterals Patent  Trif Patent Patent  AT Occluded Patent proximally then becomes collateral  Pero Patent, only runoff to foot Patent, only runoff to foot  PT Occluded Patent proximally, rapidly becomes a collateral that reconstitutes a distal PT   SPECIMEN(S):  none  INDICATIONS:   Adrian Bean is a 66 y.o. male who presents with right foot gangrene.  The patient presents for: aortogram, bilateral leg runoff.  I discussed with the patient the nature of angiographic procedures, especially the limited patencies of any endovascular intervention.  The patient is aware of that the risks of an angiographic procedure include but are not limited to: bleeding, infection, access site complications, renal failure, embolization, rupture of vessel, dissection, possible need for emergent surgical intervention, possible need for surgical procedures to treat the patient's pathology, and  stroke and death.  The patient is aware of the risks and agrees to proceed.  DESCRIPTION: After full informed consent was obtained from the patient, the patient was brought back to the angiography suite.  The patient was placed supine upon the angiography table and connected to cardiopulmonary monitoring equipment.  The patient was then given conscious sedation, the amounts of which are documented in the patient's chart.  A circulating radiologic technician maintained continuous monitoring of the patient's cardiopulmonary status.  Additionally, the control room radiologic technician provided backup monitoring throughout the procedure.  The patient was prepped and drape in the standard fashion for an angiographic procedure.  At this point, attention was turned to the left groin.  Under ultrasound guidance,  The subcutaneous tissue surrounding the left common femoral artery was anesthesized with 1% lidocaine with epinephrine.  The artery was then cannulated with a 18 gauge needle.  The Bentson wire was passed up into the aorta.  The needle was exchanged for a 5-Fr sheath, which was advanced over the wire into the common femoral artery.  The dilator was then removed.  The Omniflush catheter was then loaded over the wire up to the level of L1.  The catheter was connected to the power injector circuit.  After de-airring and de-clotting the circuit, a power injector aortogram was completed.  The findings are listed above.  Using a Bentson wire and Omniflush catheter, the right common iliac artery was selected.  The catheter wound not track into the right common iliac artery, so I exchanged it for a straight catheter.  This combination also failed, so I exchanged the catheter for the Omniflush catheter.  I reselected the right common iliac artery and then exchanged the wire for  a Glidewire, which I advanced into the right common femoral artery.  I exchanged the catheter for the straight catheter.  The catheter  caught on a ledge of iliac calcification, but eventually, I advanced the catheter into the external iliac artery.  The wire was removed and the catheter was connected to the power injector circuit.  An automated right leg runoff was completed. The findings are listed above.  I then removed the catheter.  The sheath was aspirated.  No clots were present and the sheath was reloaded with heparinized saline.  I connected the left sheath to the power injector circuit.  An automated left leg runoff was completed.  The findings are listed above.  The power injector circuit was disconnected.  The sheath was aspirated.  No clots were present and the sheath was reloaded with heparinized saline.    The patient is already scheduled for right femoropopliteal bypass tomorrow.  The above-the-knee popliteal artery appears diseased so the below-the-knee popliteal artery might be a better target.   COMPLICATIONS: none  CONDITION: stable.   Adele Barthel, MD, FACS [facility] [place] Office: [phone] Pager: [phone]  06/03/2017, 12:38 PM

## 2017-06-03 NOTE — Progress Notes (Signed)
Site area: left groin fv sheath Site Prior to Removal:  Level 0 Pressure Applied For:  20 minutes Manual:   yes Patient Status During Pull:  stable Post Pull Site:  Level  0 Post Pull Instructions Given:  yes Post Pull Pulses Present: dopplered Dressing Applied:  Gauze and tegaderm Bedrest begins @ 1340 Comments:

## 2017-06-03 NOTE — Progress Notes (Signed)
Pt off floor to cath lab for arteriogram.

## 2017-06-04 ENCOUNTER — Inpatient Hospital Stay (HOSPITAL_COMMUNITY): Admission: RE | Admit: 2017-06-04 | Payer: Medicare Other | Source: Ambulatory Visit | Admitting: Vascular Surgery

## 2017-06-04 ENCOUNTER — Encounter (HOSPITAL_COMMUNITY): Payer: Self-pay | Admitting: Certified Registered"

## 2017-06-04 ENCOUNTER — Encounter (HOSPITAL_COMMUNITY)
Admission: AD | Disposition: A | Discharge status: Skilled Nursing Facility | Payer: Self-pay | Source: Other Acute Inpatient Hospital | Attending: Internal Medicine

## 2017-06-04 ENCOUNTER — Encounter (HOSPITAL_COMMUNITY): Payer: Self-pay | Admitting: Vascular Surgery

## 2017-06-04 ENCOUNTER — Inpatient Hospital Stay (HOSPITAL_COMMUNITY): Payer: Medicare Other | Admitting: Certified Registered"

## 2017-06-04 ENCOUNTER — Encounter (HOSPITAL_COMMUNITY): Admission: RE | Payer: Self-pay | Source: Ambulatory Visit

## 2017-06-04 HISTORY — PX: VEIN HARVEST: SHX6363

## 2017-06-04 HISTORY — PX: AMPUTATION: SHX166

## 2017-06-04 HISTORY — PX: FEMORAL-POPLITEAL BYPASS GRAFT: SHX937

## 2017-06-04 LAB — GLUCOSE, CAPILLARY
GLUCOSE-CAPILLARY: 118 mg/dL — AB (ref 65–99)
Glucose-Capillary: 142 mg/dL — ABNORMAL HIGH (ref 65–99)
Glucose-Capillary: 125 mg/dL — ABNORMAL HIGH (ref 65–99)
Glucose-Capillary: 119 mg/dL — ABNORMAL HIGH (ref 65–99)
Glucose-Capillary: 111 mg/dL — ABNORMAL HIGH (ref 65–99)

## 2017-06-04 LAB — CBC
HCT: 32.1 % — ABNORMAL LOW (ref 39.0–52.0)
Hemoglobin: 10.4 g/dL — ABNORMAL LOW (ref 13.0–17.0)
MCH: 30.2 pg (ref 26.0–34.0)
MCHC: 32.4 g/dL (ref 30.0–36.0)
MCV: 93.3 fL (ref 78.0–100.0)
PLATELETS: 216 10*3/uL (ref 150–400)
RBC: 3.44 MIL/uL — ABNORMAL LOW (ref 4.22–5.81)
RDW: 12.8 % (ref 11.5–15.5)
WBC: 16 10*3/uL — AB (ref 4.0–10.5)

## 2017-06-04 LAB — BASIC METABOLIC PANEL
Anion gap: 10 (ref 5–15)
BUN: 20 mg/dL (ref 6–20)
CO2: 25 mmol/L (ref 22–32)
CREATININE: 1.38 mg/dL — AB (ref 0.61–1.24)
Calcium: 8.2 mg/dL — ABNORMAL LOW (ref 8.9–10.3)
Chloride: 98 mmol/L — ABNORMAL LOW (ref 101–111)
GFR calc Af Amer: 59 mL/min — ABNORMAL LOW (ref >60)
GFR, EST NON AFRICAN AMERICAN: 51 mL/min — AB (ref >60)
Glucose, Bld: 131 mg/dL — ABNORMAL HIGH (ref 65–99)
Potassium: 4 mmol/L (ref 3.5–5.1)
SODIUM: 133 mmol/L — AB (ref 135–145)

## 2017-06-04 LAB — PROTIME-INR
INR: 1.25
PROTHROMBIN TIME: 15.6 s — AB (ref 11.4–15.2)

## 2017-06-04 SURGERY — BYPASS GRAFT FEMORAL-POPLITEAL ARTERY
Anesthesia: General | Laterality: Right

## 2017-06-04 SURGERY — BYPASS GRAFT FEMORAL-POPLITEAL ARTERY
Anesthesia: Choice | Laterality: Right

## 2017-06-04 MED ORDER — ALBUTEROL SULFATE HFA 108 (90 BASE) MCG/ACT IN AERS
INHALATION_SPRAY | RESPIRATORY_TRACT | Status: DC | PRN
  Administered 2017-06-04 (×6): 2 via RESPIRATORY_TRACT

## 2017-06-04 MED ORDER — MORPHINE SULFATE (PF) 4 MG/ML IV SOLN
4.0000 mg | INTRAVENOUS | Status: DC | PRN
  Administered 2017-06-05 – 2017-06-10 (×8): 4 mg via INTRAVENOUS
  Filled 2017-06-04 (×10): qty 1

## 2017-06-04 MED ORDER — PROTAMINE SULFATE 10 MG/ML IV SOLN
INTRAVENOUS | Status: AC
  Filled 2017-06-04: qty 5

## 2017-06-04 MED ORDER — PHENYLEPHRINE 40 MCG/ML (10ML) SYRINGE FOR IV PUSH (FOR BLOOD PRESSURE SUPPORT)
PREFILLED_SYRINGE | INTRAVENOUS | Status: DC | PRN
  Administered 2017-06-04 (×2): 40 ug via INTRAVENOUS

## 2017-06-04 MED ORDER — FENTANYL CITRATE (PF) 250 MCG/5ML IJ SOLN
INTRAMUSCULAR | Status: AC
  Filled 2017-06-04: qty 5

## 2017-06-04 MED ORDER — OXYCODONE HCL 5 MG/5ML PO SOLN: 5.0000 mg | Freq: Once | ORAL | Status: DC | PRN

## 2017-06-04 MED ORDER — METOPROLOL TARTRATE 5 MG/5ML IV SOLN: 2.0000 mg | INTRAVENOUS | Status: DC | PRN

## 2017-06-04 MED ORDER — SODIUM CHLORIDE 0.9 % IV SOLN
INTRAVENOUS | Status: DC | PRN
  Administered 2017-06-04: 12:00:00 500 mL

## 2017-06-04 MED ORDER — ALBUTEROL SULFATE HFA 108 (90 BASE) MCG/ACT IN AERS
INHALATION_SPRAY | RESPIRATORY_TRACT | Status: AC
  Filled 2017-06-04: qty 6.7

## 2017-06-04 MED ORDER — EPHEDRINE SULFATE 50 MG/ML IJ SOLN
INTRAMUSCULAR | Status: DC | PRN
  Administered 2017-06-04: 10 mg via INTRAVENOUS

## 2017-06-04 MED ORDER — PROTAMINE SULFATE 10 MG/ML IV SOLN
INTRAVENOUS | Status: DC | PRN
  Administered 2017-06-04: 50 mg via INTRAVENOUS

## 2017-06-04 MED ORDER — SODIUM CHLORIDE 0.9 % IV SOLN: 500.0000 mL | Freq: Once | INTRAVENOUS | Status: DC | PRN

## 2017-06-04 MED ORDER — CEFAZOLIN SODIUM-DEXTROSE 1-4 GM/50ML-% IV SOLN
INTRAVENOUS | Status: DC | PRN
  Administered 2017-06-04: 1 g via INTRAVENOUS

## 2017-06-04 MED ORDER — HEPARIN SODIUM (PORCINE) 1000 UNIT/ML IJ SOLN
INTRAMUSCULAR | Status: DC | PRN
  Administered 2017-06-04: 8000 [IU] via INTRAVENOUS

## 2017-06-04 MED ORDER — FENTANYL CITRATE (PF) 100 MCG/2ML IJ SOLN: 25.0000 ug | INTRAMUSCULAR | Status: DC | PRN

## 2017-06-04 MED ORDER — SUGAMMADEX SODIUM 200 MG/2ML IV SOLN
INTRAVENOUS | Status: AC
  Filled 2017-06-04: qty 2

## 2017-06-04 MED ORDER — MIDAZOLAM HCL 5 MG/5ML IJ SOLN
INTRAMUSCULAR | Status: DC | PRN
  Administered 2017-06-04: 2 mg via INTRAVENOUS

## 2017-06-04 MED ORDER — SODIUM CHLORIDE 0.9 % IV SOLN
INTRAVENOUS | Status: DC
  Administered 2017-06-04: 15:00:00 via INTRAVENOUS

## 2017-06-04 MED ORDER — OXYCODONE HCL 5 MG PO TABS: 5.0000 mg | ORAL_TABLET | Freq: Once | ORAL | Status: DC | PRN

## 2017-06-04 MED ORDER — 0.9 % SODIUM CHLORIDE (POUR BTL) OPTIME
TOPICAL | Status: DC | PRN
  Administered 2017-06-04: 2000 mL

## 2017-06-04 MED ORDER — LACTATED RINGERS IV SOLN
INTRAVENOUS | Status: DC
  Administered 2017-06-04 (×3): via INTRAVENOUS

## 2017-06-04 MED ORDER — ONDANSETRON HCL 4 MG/2ML IJ SOLN
INTRAMUSCULAR | Status: AC
  Filled 2017-06-04: qty 2

## 2017-06-04 MED ORDER — ALBUMIN HUMAN 5 % IV SOLN
INTRAVENOUS | Status: DC | PRN
  Administered 2017-06-04: 13:00:00 via INTRAVENOUS

## 2017-06-04 MED ORDER — ONDANSETRON HCL 4 MG/2ML IJ SOLN
INTRAMUSCULAR | Status: DC | PRN
  Administered 2017-06-04: 4 mg via INTRAVENOUS

## 2017-06-04 MED ORDER — POTASSIUM CHLORIDE CRYS ER 20 MEQ PO TBCR: 20.0000 meq | EXTENDED_RELEASE_TABLET | Freq: Every day | ORAL | Status: DC | PRN

## 2017-06-04 MED ORDER — PHENYLEPHRINE HCL 10 MG/ML IJ SOLN
INTRAVENOUS | Status: DC | PRN
  Administered 2017-06-04: 30 ug/min via INTRAVENOUS

## 2017-06-04 MED ORDER — MAGNESIUM SULFATE 2 GM/50ML IV SOLN
2.0000 g | Freq: Every day | INTRAVENOUS | Status: DC | PRN
  Filled 2017-06-04: qty 50

## 2017-06-04 MED ORDER — LIDOCAINE HCL (CARDIAC) 20 MG/ML IV SOLN
INTRAVENOUS | Status: DC | PRN
  Administered 2017-06-04: 60 mg via INTRAVENOUS

## 2017-06-04 MED ORDER — MIDAZOLAM HCL 2 MG/2ML IJ SOLN
INTRAMUSCULAR | Status: AC
Start: 2017-06-04 — End: 2017-06-04
  Filled 2017-06-04: qty 2

## 2017-06-04 MED ORDER — PHENYLEPHRINE 40 MCG/ML (10ML) SYRINGE FOR IV PUSH (FOR BLOOD PRESSURE SUPPORT)
PREFILLED_SYRINGE | INTRAVENOUS | Status: AC
  Filled 2017-06-04: qty 10

## 2017-06-04 MED ORDER — PROPOFOL 10 MG/ML IV BOLUS
INTRAVENOUS | Status: DC | PRN
  Administered 2017-06-04 (×2): 20 mg via INTRAVENOUS
  Administered 2017-06-04: 100 mg via INTRAVENOUS

## 2017-06-04 MED ORDER — PROPOFOL 10 MG/ML IV BOLUS
INTRAVENOUS | Status: AC
  Filled 2017-06-04: qty 20

## 2017-06-04 MED ORDER — POLYETHYLENE GLYCOL 3350 17 G PO PACK: 17.0000 g | PACK | Freq: Every day | ORAL | Status: DC | PRN

## 2017-06-04 MED ORDER — OXYCODONE HCL 5 MG PO TABS
5.0000 mg | ORAL_TABLET | Freq: Once | ORAL | Status: DC | PRN
  Filled 2017-06-04: qty 1

## 2017-06-04 MED ORDER — SUCCINYLCHOLINE CHLORIDE 20 MG/ML IJ SOLN
INTRAMUSCULAR | Status: DC | PRN
  Administered 2017-06-04: 80 mg via INTRAVENOUS

## 2017-06-04 MED ORDER — FENTANYL CITRATE (PF) 100 MCG/2ML IJ SOLN
INTRAMUSCULAR | Status: DC | PRN
  Administered 2017-06-04 (×2): 50 ug via INTRAVENOUS

## 2017-06-04 MED ORDER — ROCURONIUM BROMIDE 100 MG/10ML IV SOLN
INTRAVENOUS | Status: DC | PRN
  Administered 2017-06-04: 50 mg via INTRAVENOUS

## 2017-06-04 SURGICAL SUPPLY — 59 items
BANDAGE ACE 4X5 VEL STRL LF (GAUZE/BANDAGES/DRESSINGS) ×3 IMPLANT
BANDAGE ESMARK 6X9 LF (GAUZE/BANDAGES/DRESSINGS) IMPLANT
BNDG ESMARK 6X9 LF (GAUZE/BANDAGES/DRESSINGS)
BNDG GAUZE ELAST 4 BULKY (GAUZE/BANDAGES/DRESSINGS) ×3 IMPLANT
CANISTER SUCT 3000ML PPV (MISCELLANEOUS) ×3 IMPLANT
CANNULA VESSEL 3MM 2 BLNT TIP (CANNULA) ×6 IMPLANT
CLIP LIGATING EXTRA MED SLVR (CLIP) ×6 IMPLANT
CLIP LIGATING EXTRA SM BLUE (MISCELLANEOUS) ×3 IMPLANT
COVER SURGICAL LIGHT HANDLE (MISCELLANEOUS) ×6 IMPLANT
CUFF TOURNIQUET SINGLE 34IN LL (TOURNIQUET CUFF) IMPLANT
CUFF TOURNIQUET SINGLE 44IN (TOURNIQUET CUFF) IMPLANT
DERMABOND ADVANCED (GAUZE/BANDAGES/DRESSINGS) ×6
DERMABOND ADVANCED .7 DNX12 (GAUZE/BANDAGES/DRESSINGS) ×3 IMPLANT
DRAIN SNY 10X20 3/4 PERF (WOUND CARE) IMPLANT
DRAPE EXTREMITY T 121X128X90 (DRAPE) ×3 IMPLANT
DRAPE HALF SHEET 40X57 (DRAPES) IMPLANT
DRAPE X-RAY CASS 24X20 (DRAPES) IMPLANT
ELECT REM PT RETURN 9FT ADLT (ELECTROSURGICAL) ×3
ELECTRODE REM PT RTRN 9FT ADLT (ELECTROSURGICAL) ×1 IMPLANT
EVACUATOR SILICONE 100CC (DRAIN) IMPLANT
GAUZE SPONGE 4X4 12PLY STRL (GAUZE/BANDAGES/DRESSINGS) ×3 IMPLANT
GAUZE SPONGE 4X4 12PLY STRL LF (GAUZE/BANDAGES/DRESSINGS) ×3 IMPLANT
GLOVE BIO SURGEON STRL SZ 6.5 (GLOVE) ×4 IMPLANT
GLOVE BIO SURGEON STRL SZ7 (GLOVE) ×3 IMPLANT
GLOVE BIO SURGEONS STRL SZ 6.5 (GLOVE) ×2
GLOVE BIOGEL PI IND STRL 6.5 (GLOVE) ×3 IMPLANT
GLOVE BIOGEL PI INDICATOR 6.5 (GLOVE) ×6
GLOVE SS BIOGEL STRL SZ 7.5 (GLOVE) ×1 IMPLANT
GLOVE SUPERSENSE BIOGEL SZ 7.5 (GLOVE) ×2
GLOVE SURG SS PI 6.0 STRL IVOR (GLOVE) ×3 IMPLANT
GOWN STRL REUS W/ TWL LRG LVL3 (GOWN DISPOSABLE) ×6 IMPLANT
GOWN STRL REUS W/TWL LRG LVL3 (GOWN DISPOSABLE) ×12
INSERT FOGARTY SM (MISCELLANEOUS) IMPLANT
KIT BASIN OR (CUSTOM PROCEDURE TRAY) ×3 IMPLANT
KIT ROOM TURNOVER OR (KITS) ×3 IMPLANT
NEEDLE 22X1 1/2 (OR ONLY) (NEEDLE) IMPLANT
NS IRRIG 1000ML POUR BTL (IV SOLUTION) ×6 IMPLANT
PACK GENERAL/GYN (CUSTOM PROCEDURE TRAY) ×3 IMPLANT
PACK PERIPHERAL VASCULAR (CUSTOM PROCEDURE TRAY) ×3 IMPLANT
PAD ARMBOARD 7.5X6 YLW CONV (MISCELLANEOUS) ×6 IMPLANT
PADDING CAST COTTON 6X4 STRL (CAST SUPPLIES) IMPLANT
SET COLLECT BLD 21X3/4 12 (NEEDLE) IMPLANT
SPONGE LAP 18X18 X RAY DECT (DISPOSABLE) ×3 IMPLANT
STOPCOCK 4 WAY LG BORE MALE ST (IV SETS) IMPLANT
SUT ETHILON 3 0 PS 1 (SUTURE) ×3 IMPLANT
SUT PROLENE 5 0 C 1 24 (SUTURE) ×9 IMPLANT
SUT PROLENE 6 0 CC (SUTURE) ×3 IMPLANT
SUT SILK 2 0 SH (SUTURE) ×3 IMPLANT
SUT SILK 3 0 (SUTURE) ×2
SUT SILK 3-0 18XBRD TIE 12 (SUTURE) ×1 IMPLANT
SUT VIC AB 2-0 CTX 36 (SUTURE) ×6 IMPLANT
SUT VIC AB 3-0 SH 27 (SUTURE) ×6
SUT VIC AB 3-0 SH 27X BRD (SUTURE) ×3 IMPLANT
SYR CONTROL 10ML LL (SYRINGE) IMPLANT
TOWEL GREEN STERILE (TOWEL DISPOSABLE) ×6 IMPLANT
TRAY FOLEY W/METER SILVER 16FR (SET/KITS/TRAYS/PACK) ×3 IMPLANT
TUBING EXTENTION W/L.L. (IV SETS) IMPLANT
UNDERPAD 30X30 (UNDERPADS AND DIAPERS) ×3 IMPLANT
WATER STERILE IRR 1000ML POUR (IV SOLUTION) ×3 IMPLANT

## 2017-06-04 NOTE — Progress Notes (Signed)
[facility] Progress Note  Patient: Adrian Bean ASN:[id number]   PCP: [name], MD DOB: 05/30/1951   DOA: 06/02/2017   DOS: 06/04/2017   Date of Service: the patient was seen and examined on 06/04/2017  Subjective: Seen after surgery.  Tolerated it well.  No acute complaints.  Brief hospital course: Pt. with PMH of type II DM, neuropathy, PAD, left foot amputation, tobacco abuse; admitted on 06/02/2017, presented with complaint of right leg wound on third toe, was found to have dry gangrene.  Initially admitted at [facility] for cellulitis and gangrene and started on IV antibiotics.  CTA showed extensive bilateral PVD and patient was transferred to [facility] after discussion with vascular surgery.  Underwent bilateral angiogram and now scheduled for aortofemoral popliteal bypass. Currently further plan is follow-up on postoperative course.  Assessment and Plan: 1.  Right third toe gangrene. Peripheral vascular disease. Sepsis secondary to cellulitis. 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  Presented with sepsis physiology in the outside facility currently resolved. Started on IV antibiotics, continues to have leukocytosis. MRSA PCR is negative. Currently on IV vancomycin and Zosyn.  Continue preoperatively.  2.  Type 2 diabetes mellitus. Hemoglobin A1c 6.3. Diabetic neuropathy. Continue sliding scale insulin. Continue gabapentin. Holding home oral hypoglycemic agents.  3.  Active smoker. Discussed importance of quitting. Continue nicotine patch.  4.  Hepatic flexure of the colon thickening. Incidentally noted on the CT scan. Will require outpatient GI follow-up with colonoscopy.  Diet: NPO DVT Prophylaxis: subcutaneous Heparin  Advance goals of care discussion: full code  Family Communication: no family was present at bedside, at the time of interview.   Disposition:  Discharge to be determined.    Consultants: vascular surgery Procedures: Bilateral angiogram.  Antibiotics: Anti-infectives (From admission, onward)   Start     Dose/Rate Route Frequency Ordered Stop   06/03/17 0800  ceFAZolin (ANCEF) IVPB 1 g/50 mL premix     1 g 100 mL/hr over 30 Minutes Intravenous On call 06/02/17 1704 06/04/17 0800   06/03/17 0600  vancomycin (VANCOCIN) IVPB 1000 mg/200 mL premix     1,000 mg 200 mL/hr over 60 Minutes Intravenous Every 12 hours 06/02/17 1917     06/02/17 2000  piperacillin-tazobactam (ZOSYN) IVPB 3.375 g     3.375 g 12.5 mL/hr over 240 Minutes Intravenous Every 8 hours 06/02/17 1912         Objective: Physical Exam: Vitals:   06/03/17 1700 06/03/17 1800 06/03/17 2000 06/04/17 0315  BP: (!) 145/83 126/70 136/81 120/69  Pulse:      Resp: 17 16 16 19   Temp: 99.2 F (37.3 C)  99.5 F (37.5 C) 98.8 F (37.1 C)  TempSrc: Oral  Oral Oral  SpO2: 92% 94% 91% 92%  Weight:      Height:        Intake/Output Summary (Last 24 hours) at 06/04/2017 1937 Last data filed at 06/04/2017 9024 Gross per 24 hour  Intake 2070 ml  Output 825 ml  Net 1245 ml   Filed Weights   06/02/17 1830  Weight: 80.3 kg (177 lb)   General: Alert, Awake and Oriented to Time, Place and Person. Appear in mild distress, affect appropriate ENT: Oral Mucosa clear moist. Neck: NO JVD, NO Abnormal Mass Or lumps Cardiovascular: S1 and S2 Present, NO Murmur, Peripheral Pulses Present Respiratory: normal respiratory effort, Bilateral Air entry equal and Decreased, no use of accessory muscle, Clear to Auscultation, no Crackles,  no wheezes Abdomen: Bowel Sound present, Soft and no tenderness, no hernia Skin: no redness, no Rash, no induration Extremities: no Pedal edema, no calf tenderness Neurologic: Grossly no focal neuro deficit. Bilaterally Equal motor strength  Data Reviewed: CBC: Recent Labs  Lab 06/03/17 0422 06/04/17 0332  WBC 15.7* 16.0*  HGB 10.9* 10.4*  HCT 32.8* 32.1*  MCV 93.4 93.3   PLT 202 161   Basic Metabolic Panel: Recent Labs  Lab 06/03/17 0422 06/04/17 0332  NA 134* 133*  K 4.1 4.0  CL 98* 98*  CO2 24 25  GLUCOSE 138* 131*  BUN 23* 20  CREATININE 1.39* 1.38*  CALCIUM 8.5* 8.2*    Liver Function Tests: No results for input(s): AST, ALT, ALKPHOS, BILITOT, PROT, ALBUMIN in the last 168 hours. No results for input(s): LIPASE, AMYLASE in the last 168 hours. No results for input(s): AMMONIA in the last 168 hours. Coagulation Profile: Recent Labs  Lab 06/03/17 0422 06/04/17 0332  INR 1.18 1.25   Cardiac Enzymes: No results for input(s): CKTOTAL, CKMB, CKMBINDEX, TROPONINI in the last 168 hours. BNP (last 3 results) No results for input(s): PROBNP in the last 8760 hours. CBG: Recent Labs  Lab 06/02/17 2113 06/03/17 0743 06/03/17 1324 06/03/17 1627 06/03/17 2106  GLUCAP 153* 138* 132* 115* 201*   Studies: No results found.  Scheduled Meds: . aspirin EC  81 mg Oral Daily  . atorvastatin  10 mg Oral q1800  . docusate sodium  100 mg Oral BID  . enoxaparin (LOVENOX) injection  30 mg Subcutaneous Q24H  . gabapentin  600 mg Oral TID  . Influenza vac split quadrivalent PF  0.5 mL Intramuscular Tomorrow-1000  . insulin aspart  0-15 Units Subcutaneous TID WC  . multivitamin with minerals  1 tablet Oral Daily  . nicotine  14 mg Transdermal Daily  . pantoprazole  40 mg Oral Daily  . sodium chloride flush  3 mL Intravenous Q12H   Continuous Infusions: . sodium chloride    . sodium chloride 100 mL/hr at 06/03/17 1900  .  ceFAZolin (ANCEF) IV    . piperacillin-tazobactam (ZOSYN)  IV 3.375 g (06/04/17 0526)  . vancomycin Stopped (06/04/17 0960)   PRN Meds: sodium chloride, acetaminophen **OR** acetaminophen, hydrALAZINE, labetalol, morphine injection, ondansetron **OR** ondansetron (ZOFRAN) IV, oxyCODONE, sodium chloride flush  Time spent: 35 minutes  Author: Berle Mull, MD [facility] Pager: [phone] 06/04/2017 6:52 AM  If  7PM-7AM, please contact night-coverage at [email or web address], password [facility]

## 2017-06-04 NOTE — Anesthesia Procedure Notes (Signed)
Procedure Name: Intubation Date/Time: 06/04/2017 10:56 AM Performed by: Lance Coon, CRNA Pre-anesthesia Checklist: Patient identified, Emergency Drugs available, Suction available, Patient being monitored and Timeout performed Patient Re-evaluated:Patient Re-evaluated prior to induction Oxygen Delivery Method: Circle system utilized Preoxygenation: Pre-oxygenation with 100% oxygen Induction Type: IV induction Ventilation: Mask ventilation without difficulty Laryngoscope Size: Mac and 4 Grade View: Grade II Tube type: Oral Tube size: 7.5 mm Number of attempts: 1 Airway Equipment and Method: Stylet Placement Confirmation: ETT inserted through vocal cords under direct vision,  breath sounds checked- equal and bilateral and positive ETCO2 Secured at: 22 cm Tube secured with: Tape Dental Injury: Teeth and Oropharynx as per pre-operative assessment  Comments: Airway per Otho Darner, RN

## 2017-06-04 NOTE — Op Note (Signed)
OPERATIVE REPORT  DATE OF SURGERY: 06/04/2017  PATIENT: Adrian Bean, 66 y.o. male MRN: [id number]  DOB: 18-Sep-1950  PRE-OPERATIVE DIAGNOSIS: Gangrene right third toe with right leg critical limb ischemia  POST-OPERATIVE DIAGNOSIS:  Same  PROCEDURE: Right femoral to above-knee popliteal bypass with translocated non-reversed great saphenous vein, right open third toe amputation  SURGEON:  Curt Jews, M.D.  PHYSICIAN ASSISTANT: Matt Eveland PA-C  ANESTHESIA: General  EBL: 100 ml  Total I/O In: 2350 [I.V.:2100; IV Piggyback:250] Out: 253 [Urine:575; Blood:100]  BLOOD ADMINISTERED: None  DRAINS: None  SPECIMEN: None  COUNTS CORRECT:  YES  PLAN OF CARE: PACU  PATIENT DISPOSITION:  PACU - hemodynamically stable  PROCEDURE DETAILS: The patient was taken to the operating room placed in supine position where the area of the right groin right leg and right foot were prepped and draped in usual sterile fashion.  Incision was made over the femoral pulse and carried down to isolate the common superficial femoral and profundus of his arteries.  The saphenous vein was identified through the same incision in the groin.  Saphenous vein was harvested from the groin to the knee using a skin bridge in the mid thigh.  The vein was of large caliber.  The above-knee popliteal artery was exposed to the distal vein harvest incision and also was of a large size with moderate atherosclerotic plaque present.  The saphenous vein was occluded at the saphenofemoral junction and the saphenous vein was excised from the saphenofemoral junction which was oversewn with a 5-0 Prolene suture.  The vein was ligated distally and divided.  The vein was gently dilated and was of good caliber.  A tunnel was created from the level of the above-knee popliteal artery to the groin and the patient was given 8000 units of intravenous heparin.  After adequate circulation time the common superficial femoral and profunda  femoris arteries were occluded.  The common femoral artery was opened with an 11.  The vein was brought onto the field and was spatulated in the proximal portion of the vein was sewn end-to-side to the junction of the common and superficial femoral arteries with a running 5-0 Prolene suture.  This anastomosis was tested and found to be adequate.  Jerelene Redden valvulotome was used to lyse the vein valves.  The majority of the veins were incompetent.  The vein was brought through the prior created tunnel down to the above-knee popliteal artery.  The above-knee popliteal artery was occluded proximally and distally and was opened with an 11 blade and the vein was spatulated and cut to the appropriate length and was sewn end-to-side to the above-knee popliteal artery with a running 5-0 Prolene suture.  Prior to completion of the anastomosis the usual flushing maneuvers were undertaken.  The anastomosis was completed and clamps were removed.  There was a good graft dependent Doppler flow at the anterior tibial and posterior tibial arteries at the ankle.  The patient was given 50 mg of protamine to reverse the heparin.  The wounds were irrigated with saline.  Hemostasis obtained with cautery.  The wounds were closed with 2-0 Vicryl in the fascia and 3 oh septic or Vicryl sutures.  Attention was then turned to the right foot.  Patient had gangrene of the second toe extending down to the metatarsal head.  The third toe and the metatarsal head were amputated with the Francee Piccolo being used to.  This was all debrided back to bleeding tissue.  The wound was irrigated and  was packed with Betadine soaked 4 x 4 and Kerlix was applied.  The patient was transferred to the recovery room in stable condition   Rosetta Posner, M.D., [facility] 06/04/2017 2:26 PM

## 2017-06-04 NOTE — Progress Notes (Signed)
PT Cancellation Note  Patient Details Name: Adrian Bean MRN: [id number] DOB: 03-Jun-1951   Cancelled Treatment:    Reason Eval/Treat Not Completed: Pain limiting ability to participate. Declining PT evaluation today. Will follow-up tomorrow.  Mabeline Caras, PT, DPT Acute Rehab Services  Pager: [place] 06/04/2017, 4:15 PM

## 2017-06-04 NOTE — Progress Notes (Signed)
  Day of Surgery Note    Subjective:  Patient still somewhat somnolent.  Having soreness in R mid thigh.   Vitals:   06/04/17 1438 06/04/17 1500  BP: 107/60 110/63  Pulse:    Resp: 18 16  Temp: 98.2 F (36.8 C)   SpO2:  92%    Incisions:   Incisions of R groin and medial thigh are soft without palpable hematoma or bleeding Extremities:  Dressing left in place R foot; 3 vessel runoff by doppler Cardiac:  RRR Lungs:  No increase in resp effort Abdomen:  Soft   Assessment/Plan:  This is a 66 y.o. male who is s/p R femoral to AK popliteal bypass with GSV conduit and 3rd toe amputation  - 3 vessel run off by doppler; monitor peripheral signals overnight - IV pain medication as needed - Continue current dressing to R foot - ABIs in am   Dagoberto Ligas, PA-C 06/04/2017 4:09 PM [phone]

## 2017-06-04 NOTE — Anesthesia Preprocedure Evaluation (Signed)
Anesthesia Evaluation  Patient identified by MRN, date of birth, ID band Patient awake    Reviewed: Allergy & Precautions, NPO status , Patient's Chart, lab work & pertinent test results  History of Anesthesia Complications Negative for: history of anesthetic complications  Airway Mallampati: II  TM Distance: >3 FB Neck ROM: Full    Dental  (+) Poor Dentition, Missing   Pulmonary neg shortness of breath, neg sleep apnea, neg COPD, neg recent URI, Current Smoker,    breath sounds clear to auscultation       Cardiovascular hypertension, Pt. on medications (-) angina+ Peripheral Vascular Disease  (-) Past MI and (-) CHF  Rhythm:Regular     Neuro/Psych negative neurological ROS  negative psych ROS   GI/Hepatic negative GI ROS, Neg liver ROS,   Endo/Other  diabetes, Type 2, Oral Hypoglycemic Agents  Renal/GU Renal InsufficiencyRenal disease     Musculoskeletal   Abdominal   Peds  Hematology  (+) anemia ,   Anesthesia Other Findings   Reproductive/Obstetrics                             Anesthesia Physical Anesthesia Plan  ASA: III  Anesthesia Plan: General   Post-op Pain Management:    Induction: Intravenous  PONV Risk Score and Plan: 1 and Ondansetron  Airway Management Planned: Oral ETT  Additional Equipment: None  Intra-op Plan:   Post-operative Plan: Extubation in OR  Informed Consent: I have reviewed the patients History and Physical, chart, labs and discussed the procedure including the risks, benefits and alternatives for the proposed anesthesia with the patient or authorized representative who has indicated his/her understanding and acceptance.   Dental advisory given  Plan Discussed with: CRNA and Surgeon  Anesthesia Plan Comments:         Anesthesia Quick Evaluation

## 2017-06-04 NOTE — Interval H&P Note (Signed)
History and Physical Interval Note:  06/04/2017 10:20 AM  Adrian Bean  has presented today for surgery, with the diagnosis of peripheral vascular disease  The various methods of treatment have been discussed with the patient and family. After consideration of risks, benefits and other options for treatment, the patient has consented to  Procedure(s): BYPASS GRAFT FEMORAL-POPLITEAL ARTERY (Right) AMPUTATION 3rd toeDIGIT (Right) as a surgical intervention .  The patient's history has been reviewed, patient examined, no change in status, stable for surgery.  I have reviewed the patient's chart and labs.  Questions were answered to the patient's satisfaction.     Curt Jews

## 2017-06-04 NOTE — Transfer of Care (Signed)
Immediate Anesthesia Transfer of Care Note  Patient: Adrian Bean  Procedure(s) Performed: BYPASS  FEMORAL-POPLITEAL ARTERY USING GREATER SAPHENOUS VEIN (Right ) AMPUTATION 3rd toeDIGIT (Right ) VEIN HARVEST RIGHT SAPHENOUS VEIN (Right )  Patient Location: PACU  Anesthesia Type:General  Level of Consciousness: awake and patient cooperative  Airway & Oxygen Therapy: Patient Spontanous Breathing and Patient connected to face mask oxygen  Post-op Assessment: Report given to RN and Post -op Vital signs reviewed and stable  Post vital signs: Reviewed and stable  Last Vitals:  Vitals:   06/04/17 0816 06/04/17 1410  BP: 125/74 119/63  Pulse:  87  Resp: 15 18  Temp: 37.1 C   SpO2: 96% 96%    Last Pain:  Vitals:   06/04/17 1410  TempSrc:   PainSc: Asleep      Patients Stated Pain Goal: 0 (13/08/65 7846)  Complications: No apparent anesthesia complications

## 2017-06-05 ENCOUNTER — Inpatient Hospital Stay (HOSPITAL_COMMUNITY): Payer: Medicare Other

## 2017-06-05 ENCOUNTER — Encounter (HOSPITAL_COMMUNITY): Payer: Self-pay | Admitting: Vascular Surgery

## 2017-06-05 DIAGNOSIS — I739 Peripheral vascular disease, unspecified: Secondary | ICD-10-CM

## 2017-06-05 LAB — BASIC METABOLIC PANEL
Anion gap: 7 (ref 5–15)
BUN: 23 mg/dL — AB (ref 6–20)
CALCIUM: 7.7 mg/dL — AB (ref 8.9–10.3)
CO2: 23 mmol/L (ref 22–32)
CREATININE: 1.76 mg/dL — AB (ref 0.61–1.24)
Chloride: 103 mmol/L (ref 101–111)
GFR, EST AFRICAN AMERICAN: 44 mL/min — AB (ref >60)
GFR, EST NON AFRICAN AMERICAN: 38 mL/min — AB (ref >60)
Glucose, Bld: 94 mg/dL (ref 65–99)
Potassium: 4.2 mmol/L (ref 3.5–5.1)
SODIUM: 133 mmol/L — AB (ref 135–145)

## 2017-06-05 LAB — CBC
HCT: 28.7 % — ABNORMAL LOW (ref 39.0–52.0)
HEMOGLOBIN: 9.5 g/dL — AB (ref 13.0–17.0)
MCH: 31.1 pg (ref 26.0–34.0)
MCHC: 33.1 g/dL (ref 30.0–36.0)
MCV: 94.1 fL (ref 78.0–100.0)
PLATELETS: 195 10*3/uL (ref 150–400)
RBC: 3.05 MIL/uL — ABNORMAL LOW (ref 4.22–5.81)
RDW: 12.9 % (ref 11.5–15.5)
WBC: 13.9 10*3/uL — ABNORMAL HIGH (ref 4.0–10.5)

## 2017-06-05 LAB — GLUCOSE, CAPILLARY
GLUCOSE-CAPILLARY: 204 mg/dL — AB (ref 65–99)
GLUCOSE-CAPILLARY: 166 mg/dL — AB (ref 65–99)
Glucose-Capillary: 117 mg/dL — ABNORMAL HIGH (ref 65–99)
Glucose-Capillary: 114 mg/dL — ABNORMAL HIGH (ref 65–99)

## 2017-06-05 MED ORDER — SODIUM CHLORIDE 0.9 % IV SOLN
1.5000 g | Freq: Four times a day (QID) | INTRAVENOUS | Status: DC
  Administered 2017-06-05 – 2017-06-06 (×5): 1.5 g via INTRAVENOUS
  Filled 2017-06-05 (×6): qty 1.5

## 2017-06-05 MED ORDER — GLUCERNA SHAKE PO LIQD
237.0000 mL | Freq: Three times a day (TID) | ORAL | Status: DC
  Administered 2017-06-05 – 2017-06-12 (×8): 237 mL via ORAL
  Filled 2017-06-05 (×2): qty 237

## 2017-06-05 NOTE — Progress Notes (Signed)
Inpatient Diabetes Program Recommendations  AACE/ADA: New Consensus Statement on Inpatient Glycemic Control (2015)  Target Ranges:  Prepandial:   less than 140 mg/dL      Peak postprandial:   less than 180 mg/dL (1-2 hours)      Critically ill patients:  140 - 180 mg/dL   Lab Results  Component Value Date   GLUCAP 166 (H) 06/05/2017   HGBA1C 6.3 (H) 06/03/2017    Review of Glycemic Control  Diabetes history: DM2 Outpatient Diabetes medications: Amaryl 2 mg QD Current orders for Inpatient glycemic control: Novolog 0-15 units tidwc  HgbA1C - 6.3%. Good control   Inpatient Diabetes Program Recommendations:     Agree with orders.  Will follow.  Thank you. Lorenda Peck, RD, LDN, CDE Inpatient Diabetes Coordinator [phone]

## 2017-06-05 NOTE — Evaluation (Signed)
Physical Therapy Evaluation Patient Details Name: Adrian Bean MRN: [id number] DOB: 1950/12/15 Today's Date: 06/05/2017   History of Present Illness  Pt is a 66 y.o. male s/p R femoral to AK popliteal bypass graft and R 3rd toe amputation. PMH: HTN, DM, PVD with L toe amputations, renal disease, current smoker.    Clinical Impression  Pt presents with pain and an overall decrease in functional mobility secondary to above. PTA, pt mod indep with rollator and lives alone. Required max encouragement to participate with PT. Able to ambulate 100' with RW and intermittent min guard for balance; further mobility limited by c/o RLE pain. Pt would benefit from continued acute PT services to maximize functional mobility and independence prior to d/c with HHPT.      Follow Up Recommendations Home health PT;Supervision - Intermittent    Equipment Recommendations  None recommended by PT    Recommendations for Other Services       Precautions / Restrictions Precautions Precautions: Fall Restrictions Weight Bearing Restrictions: No      Mobility  Bed Mobility Overal bed mobility: Needs Assistance Bed Mobility: Supine to Sit     Supine to sit: Supervision;HOB elevated        Transfers Overall transfer level: Needs assistance Equipment used: Rolling walker (2 wheeled) Transfers: Sit to/from Stand Sit to Stand: Min guard         General transfer comment: Min guard for balance  Ambulation/Gait Ambulation/Gait assistance: Min guard Ambulation Distance (Feet): 100 Feet Assistive device: Rolling walker (2 wheeled);1 person hand held assist Gait Pattern/deviations: Step-through pattern;Decreased stride length;Decreased weight shift to right;Antalgic Gait velocity: Decreased Gait velocity interpretation: <1.8 ft/sec, indicative of risk for recurrent falls General Gait Details: Amb 100' in hallway with RW and intermittent min guard for balance; limited by c/o of RLE pain. Able to  amb in room with single UE support on furniture and close min guard for balance  Stairs            Wheelchair Mobility    Modified Rankin (Stroke Patients Only)       Balance Overall balance assessment: Needs assistance   Sitting balance-Leahy Scale: Good       Standing balance-Leahy Scale: Fair Standing balance comment: Can static stand with no UE support; reliant on UE support for dynamic balance                             Pertinent Vitals/Pain Pain Assessment: Faces Faces Pain Scale: Hurts whole lot Pain Location: R LE incisions (especially thigh) Pain Descriptors / Indicators: Grimacing;Guarding;Constant Pain Intervention(s): Monitored during session;Limited activity within patient's tolerance    Home Living Family/patient expects to be discharged to:: Private residence Living Arrangements: Alone   Type of Home: Apartment Home Access: Stairs to enter Entrance Stairs-Rails: Right   Home Layout: One level Home Equipment: Walker - 4 wheels      Prior Function Level of Independence: Independent with assistive device(s)         Comments: Mod indep with rollator     Hand Dominance   Dominant Hand: Right    Extremity/Trunk Assessment   Upper Extremity Assessment Upper Extremity Assessment: Generalized weakness    Lower Extremity Assessment Lower Extremity Assessment: Generalized weakness;RLE deficits/detail RLE: Unable to fully assess due to pain       Communication   Communication: No difficulties  Cognition Arousal/Alertness: Awake/alert Behavior During Therapy: Flat affect;Agitated Overall Cognitive Status: Impaired/Different from baseline Area  of Impairment: Safety/judgement                         Safety/Judgement: Decreased awareness of safety     General Comments: Agitation throughout session despite educ on importance of working with therapy, safety, RW use. Particularly dissatisfied with the RW not being as  nice as his rollator at home      General Comments General comments (skin integrity, edema, etc.): SpO2 down to 84% on RA; returned to >90% on 2L O2 [place]    Exercises     Assessment/Plan    PT Assessment Patient needs continued PT services  PT Problem List Decreased strength;Decreased activity tolerance;Decreased balance;Decreased mobility;Decreased knowledge of use of DME;Decreased safety awareness;Pain       PT Treatment Interventions DME instruction;Gait training;Stair training;Functional mobility training;Therapeutic activities;Therapeutic exercise;Balance training;Patient/family education    PT Goals (Current goals can be found in the Care Plan section)  Acute Rehab PT Goals Patient Stated Goal: Go home PT Goal Formulation: With patient Time For Goal Achievement: 06/19/17 Potential to Achieve Goals: Good    Frequency Min 3X/week   Barriers to discharge Decreased caregiver support;Inaccessible home environment      Co-evaluation               AM-PAC PT "6 Clicks" Daily Activity  Outcome Measure Difficulty turning over in bed (including adjusting bedclothes, sheets and blankets)?: A Little Difficulty moving from lying on back to sitting on the side of the bed? : A Little Difficulty sitting down on and standing up from a chair with arms (e.g., wheelchair, bedside commode, etc,.)?: A Little Help needed moving to and from a bed to chair (including a wheelchair)?: A Little Help needed walking in hospital room?: A Little Help needed climbing 3-5 steps with a railing? : A Little 6 Click Score: 18    End of Session Equipment Utilized During Treatment: Gait belt;Oxygen Activity Tolerance: Patient tolerated treatment well;Patient limited by pain Patient left: in chair;with call bell/phone within reach Nurse Communication: Mobility status PT Visit Diagnosis: Other abnormalities of gait and mobility (R26.89);Pain Pain - Right/Left: Right Pain - part of body: Leg     Time: 3300-7622 PT Time Calculation (min) (ACUTE ONLY): 28 min   Charges:   PT Evaluation $PT Eval Moderate Complexity: 1 Mod PT Treatments $Gait Training: 8-22 mins   PT G Codes:       Mabeline Caras, PT, DPT Acute Rehab Services  Pager: [place] 06/05/2017, 4:35 PM

## 2017-06-05 NOTE — Consult Note (Signed)
[facility] nurse consulted for LE wound, however after review of the chart this patient's wound care is being managed by [facility]. Orders for wound care are in the chart already. Discussed with bedside nurse to verify no other needs.    Will not consult, no other wounds than the ones being managed by the vascular surgery team.   Thanks  [place] MSN, RN,CWOCN, [place], [place] [phone])

## 2017-06-05 NOTE — Anesthesia Postprocedure Evaluation (Signed)
Anesthesia Post Note  Patient: Adrian Bean  Procedure(s) Performed: BYPASS  FEMORAL-POPLITEAL ARTERY USING GREATER SAPHENOUS VEIN (Right ) AMPUTATION 3rd toeDIGIT (Right ) VEIN HARVEST RIGHT SAPHENOUS VEIN (Right )     Patient location during evaluation: PACU Anesthesia Type: General Level of consciousness: awake and alert Pain management: pain level controlled Vital Signs Assessment: post-procedure vital signs reviewed and stable Respiratory status: spontaneous breathing, nonlabored ventilation, respiratory function stable and patient connected to nasal cannula oxygen Cardiovascular status: blood pressure returned to baseline and stable Postop Assessment: no apparent nausea or vomiting Anesthetic complications: no    Last Vitals:  Vitals:   06/05/17 0353 06/05/17 1353  BP: (!) 100/55 109/71  Pulse: 80 75  Resp: 18 18  Temp: 37.3 C 36.9 C  SpO2: 94% 94%    Last Pain:  Vitals:   06/05/17 1353  TempSrc: Oral  PainSc:                  [name]

## 2017-06-05 NOTE — Progress Notes (Signed)
ABI's have been completed.  06/05/17 11:37 AM Adrian Bean RVT

## 2017-06-05 NOTE — Progress Notes (Signed)
Initial Nutrition Assessment  DOCUMENTATION CODES:   Not applicable  INTERVENTION:    Glucerna Shake po TID, each supplement provides 220 kcal and 10 grams of protein  NUTRITION DIAGNOSIS:   Increased nutrient needs related to wound healing as evidenced by estimated needs  GOAL:   Patient will meet greater than or equal to 90% of their needs  MONITOR:   PO intake, Supplement acceptance, Labs, Weight trends, Skin  REASON FOR ASSESSMENT:   Consult Wound healing  ASSESSMENT:   66 yo Male with PMH of type II DM, neuropathy, PAD, left foot amputation, tobacco abuse; admitted on 06/02/2017, presented with complaint of right leg wound on third toe, was found to have dry gangrene.  Pt s/p procedures 12/5: BYPASS GRAFT FEMORAL-POPLITEAL ARTERY (Right) AMPUTATION 3rd toe DIGIT (Right)  RD unable to obtain nutrition hx. Pt undergoing ABI's at time of visit. No % PO intake records available per flowsheets. Medications reviewed and include MVI daily. Labs reviewed. Na 133 (L). BUN 23 (H). CBG's 111-204-166.  NUTRITION - FOCUSED PHYSICAL EXAM:    Most Recent Value  Orbital Region  Unable to assess  Upper Arm Region  Unable to assess  Thoracic and Lumbar Region  Unable to assess  Buccal Region  Unable to assess  Temple Region  Unable to assess  Clavicle Bone Region  Unable to assess  Clavicle and Acromion Bone Region  Unable to assess  Scapular Bone Region  Unable to assess  Dorsal Hand  Unable to assess  Patellar Region  Unable to assess  Anterior Thigh Region  Unable to assess  Posterior Calf Region  Unable to assess  Edema (RD Assessment)  Unable to assess    Diet Order:  Diet Carb Modified Fluid consistency: Thin; Room service appropriate? Yes  EDUCATION NEEDS:   No education needs have been identified at this time  Skin:  Skin Assessment: Skin Integrity Issues: Skin Integrity Issues:: Other (Comment) Other: LE wound  Last BM:  12/2  Height:   Ht Readings  from Last 1 Encounters:  06/04/17 6' (1.829 m)    Weight:   Wt Readings from Last 1 Encounters:  06/04/17 177 lb (80.3 kg)   Ideal Body Weight:  81 kg  BMI:  Body mass index is 24.01 kg/m.  Estimated Nutritional Needs:   Kcal:  2200-2400  Protein:  110-125 gm  Fluid:  2.2-2.4 L  Arthur Holms, RD, LDN Pager #: [phone] After-Hours Pager #: [phone]

## 2017-06-05 NOTE — Progress Notes (Signed)
Pharmacy Antibiotic Note  Adrian Bean is a 66 y.o. male admitted on 06/02/2017 with wound infection.  Pharmacy has been consulted to transition from vancomycin/zosyn to Unasyn dosing. CrCl~40-45.  Plan: Unasyn 1.5g IV q6h Monitor clinical progress, c/s, renal function F/u de-escalation plan/LOT  Height: 6' (182.9 cm) Weight: 177 lb (80.3 kg) IBW/kg (Calculated) : 77.6  Temp (24hrs), Avg:99.1 F (37.3 C), Min:98 F (36.7 C), Max:101.5 F (38.6 C)  Recent Labs  Lab 06/03/17 0422 06/04/17 0332 06/05/17 0301  WBC 15.7* 16.0* 13.9*  CREATININE 1.39* 1.38* 1.76*    Estimated Creatinine Clearance: 44.1 mL/min (A) (by C-G formula based on SCr of 1.76 mg/dL (H)).    No Known Allergies  Elicia Lamp, PharmD, BCPS Clinical Pharmacist Clinical phone for 06/05/2017 until 3:30pm: [phone] If after 3:30pm, please call main pharmacy at: [phone] 06/05/2017 8:42 AM

## 2017-06-05 NOTE — Progress Notes (Signed)
[facility] Progress Note  Patient: Adrian Bean XHB:[id number]   PCP: [name], MD DOB: April 16, 1951   DOA: 06/02/2017   DOS: 06/05/2017   Date of Service: the patient was seen and examined on 06/05/2017  Subjective: Has some soreness in the leg.  Had some fever last night T-max 101.  No cough no chest pain.  Does not use oxygen at his baseline.  No diarrhea no constipation.  Brief hospital course: Pt. with PMH of type II DM, neuropathy, PAD, left foot amputation, tobacco abuse; admitted on 06/02/2017, presented with complaint of right leg wound on third toe, was found to have dry gangrene.  Initially admitted at [facility] for cellulitis and gangrene and started on IV antibiotics.  CTA showed extensive bilateral PVD and patient was transferred to [facility] after discussion with vascular surgery.  Underwent bilateral angiogram and now scheduled for aortofemoral popliteal bypass. Currently further plan is follow-up on postoperative course.  Assessment and Plan: 1.  Right third toe gangrene.   S/P amputation of the right third toe.  06/04/2017 Right femoral to popliteal bypass.  06/04/2017 Peripheral vascular disease. Sepsis secondary to cellulitis. 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  Presented with sepsis physiology in the outside facility currently resolved. Started on IV antibiotics, continues to have leukocytosis.  Now getting better MRSA PCR is negative. Currently on IV vancomycin and Zosyn.  Switch to Unasyn and patient complete with Augmentin for a total of 7 days.  2.  Type 2 diabetes mellitus. Hemoglobin A1c 6.3. Diabetic neuropathy. Continue sliding scale insulin. Continue gabapentin. Holding home oral hypoglycemic agents.  3.  Active smoker. Discussed importance of quitting. Continue nicotine patch.  4.  Hepatic flexure of the colon thickening. Incidentally noted on the CT scan. Will require outpatient  GI follow-up with colonoscopy.  5.  Acute hypoxic respiratory failure. On 2 L of oxygen. Does not use oxygen at his baseline. Clear to auscultation.  Check chest x-ray.  Wean to room air.  Incentive spirometry.  6.  Fever on 06/03/2017. Postoperative fever most likely atelectasis versus effective anesthesia. Already on antibiotic. Check chest x-ray.  7.  Acute kidney injury. Serum creatinine elevated as compared to yesterday. On IV fluids. Monitor.  Diet: Carb modified diet DVT Prophylaxis: subcutaneous Heparin  Advance goals of care discussion: full code  Family Communication: no family was present at bedside, at the time of interview.   Disposition:  Discharge to home with home health once creatinine better, no fever for 24 hours and able to walk.  Consultants: vascular surgery Procedures: Bilateral angiogram.  Femoral-popliteal bypass  Antibiotics: Anti-infectives (From admission, onward)   Start     Dose/Rate Route Frequency Ordered Stop   06/05/17 1130  ampicillin-sulbactam (UNASYN) 1.5 g in sodium chloride 0.9 % 50 mL IVPB     1.5 g 100 mL/hr over 30 Minutes Intravenous Every 6 hours 06/05/17 0841     06/03/17 0800  ceFAZolin (ANCEF) IVPB 1 g/50 mL premix     1 g 100 mL/hr over 30 Minutes Intravenous On call 06/02/17 1704 06/04/17 0800   06/03/17 0600  vancomycin (VANCOCIN) IVPB 1000 mg/200 mL premix  Status:  Discontinued     1,000 mg 200 mL/hr over 60 Minutes Intravenous Every 12 hours 06/02/17 1917 06/05/17 0756   06/02/17 2000  piperacillin-tazobactam (ZOSYN) IVPB 3.375 g  Status:  Discontinued     3.375 g 12.5 mL/hr over 240 Minutes Intravenous Every 8 hours 06/02/17  1912 06/05/17 0757       Objective: Physical Exam: Vitals:   06/04/17 2200 06/05/17 0000 06/05/17 0200 06/05/17 0353  BP: 110/63 (!) 97/54  (!) 100/55  Pulse:    80  Resp: 14 18  18   Temp:   (!) 101.5 F (38.6 C) 99.1 F (37.3 C)  TempSrc:    Oral  SpO2: 94% 96%  94%  Weight:        Height:        Intake/Output Summary (Last 24 hours) at 06/05/2017 1055 Last data filed at 06/05/2017 0500 Gross per 24 hour  Intake 5256.67 ml  Output 925 ml  Net 4331.67 ml   Filed Weights   06/02/17 1830 06/04/17 1008  Weight: 80.3 kg (177 lb) 80.3 kg (177 lb)   General: Alert, Awake and Oriented to Time, Place and Person. Appear in mild distress, affect appropriate ENT: Oral Mucosa clear moist. Neck: NO JVD, NO Abnormal Mass Or lumps Cardiovascular: S1 and S2 Present, NO Murmur, Peripheral Pulses Present Respiratory: normal respiratory effort, Bilateral Air entry equal and Decreased, no use of accessory muscle, Clear to Auscultation, no Crackles, no wheezes Abdomen: Bowel Sound present, Soft and no tenderness, no hernia Skin: no redness, no Rash, no induration Extremities: no Pedal edema, no calf tenderness Neurologic: Grossly no focal neuro deficit. Bilaterally Equal motor strength  Data Reviewed: CBC: Recent Labs  Lab 06/03/17 0422 06/04/17 0332 06/05/17 0301  WBC 15.7* 16.0* 13.9*  HGB 10.9* 10.4* 9.5*  HCT 32.8* 32.1* 28.7*  MCV 93.4 93.3 94.1  PLT 202 216 973   Basic Metabolic Panel: Recent Labs  Lab 06/03/17 0422 06/04/17 0332 06/05/17 0301  NA 134* 133* 133*  K 4.1 4.0 4.2  CL 98* 98* 103  CO2 24 25 23   GLUCOSE 138* 131* 94  BUN 23* 20 23*  CREATININE 1.39* 1.38* 1.76*  CALCIUM 8.5* 8.2* 7.7*    Liver Function Tests: No results for input(s): AST, ALT, ALKPHOS, BILITOT, PROT, ALBUMIN in the last 168 hours. No results for input(s): LIPASE, AMYLASE in the last 168 hours. No results for input(s): AMMONIA in the last 168 hours. Coagulation Profile: Recent Labs  Lab 06/03/17 0422 06/04/17 0332  INR 1.18 1.25   Cardiac Enzymes: No results for input(s): CKTOTAL, CKMB, CKMBINDEX, TROPONINI in the last 168 hours. BNP (last 3 results) No results for input(s): PROBNP in the last 8760 hours. CBG: Recent Labs  Lab 06/04/17 0939 06/04/17 1411  06/04/17 1653 06/04/17 2113 06/05/17 0615  GLUCAP 125* 119* 118* 111* 204*   Studies: No results found.  Scheduled Meds: . aspirin EC  81 mg Oral Daily  . atorvastatin  10 mg Oral q1800  . docusate sodium  100 mg Oral BID  . enoxaparin (LOVENOX) injection  30 mg Subcutaneous Q24H  . gabapentin  600 mg Oral TID  . insulin aspart  0-15 Units Subcutaneous TID WC  . multivitamin with minerals  1 tablet Oral Daily  . nicotine  14 mg Transdermal Daily  . pantoprazole  40 mg Oral Daily  . sodium chloride flush  3 mL Intravenous Q12H   Continuous Infusions: . sodium chloride    . sodium chloride 100 mL/hr at 06/05/17 0500  . sodium chloride    . ampicillin-sulbactam (UNASYN) IV    . magnesium sulfate 1 - 4 g bolus IVPB     PRN Meds: sodium chloride, sodium chloride, acetaminophen **OR** acetaminophen, magnesium sulfate 1 - 4 g bolus IVPB, morphine injection, ondansetron **OR**  ondansetron (ZOFRAN) IV, oxyCODONE, polyethylene glycol, sodium chloride flush  Time spent: 35 minutes  Author: Berle Mull, MD [facility] Pager: [phone] 06/05/2017 10:55 AM  If 7PM-7AM, please contact night-coverage at [email or web address], password [facility]

## 2017-06-05 NOTE — Care Management Note (Signed)
Case Management Note Marvetta Gibbons RN, BSN Unit 4E-Case Manager [phone]  Patient Details  Name: Adrian Bean MRN: [id number] Date of Birth: 12/02/1950  Subjective/Objective:  Pt admitted with gangrene of toe- s/p fempop bypass with 3rd toe amputation                   Action/Plan: PTA pt lived at home- per PT/OT eval HH recommended- would need [place] orders prior to discharge- no DME recommended- CM to follow for orders and transition to home needs.   Expected Discharge Date:                  Expected Discharge Plan:  [place]  In-House Referral:     Discharge planning Services  CM Consult  Post Acute Care Choice:    Choice offered to:     DME Arranged:    DME Agency:     HH Arranged:    [place] Agency:     Status of Service:  In process, will continue to follow  If discussed at Long Length of Stay Meetings, dates discussed:    Discharge Disposition:   Additional Comments:  Dawayne Patricia, RN 06/05/2017, 4:38 PM

## 2017-06-05 NOTE — Evaluation (Signed)
Occupational Therapy Evaluation Patient Details Name: Adrian Bean MRN: [id number] DOB: Jan 29, 1951 Today's Date: 06/05/2017    History of Present Illness s/p R femoral to AK popliteal BPG and 3rd toe amputation. PMH: HTN, DM, PVD with L toe amputations, renal disease, current smoker.   Clinical Impression   Pt lives alone and is independent in ADL and IADL. He has a rollator. Pt presents with post operative pain in R LE and generalized weakness. 02 sats dropped to 86% on RA seated at EOB, returned 2L 02 and pt rebounded to 93%. Pt requires min to mod assist for ADL and min assist for mobility. He needs to be functioning modified independently to go home as he has no social support per his report. Will follow acutely.    Follow Up Recommendations  Home health OT    Equipment Recommendations  (pt refusing any equipment)    Recommendations for Other Services       Precautions / Restrictions Precautions Precautions: Fall Restrictions Weight Bearing Restrictions: No      Mobility Bed Mobility Overal bed mobility: Needs Assistance Bed Mobility: Supine to Sit;Sit to Supine     Supine to sit: Min assist Sit to supine: Mod assist   General bed mobility comments: min assist to raise trunk, assist for LEs back into bed  Transfers Overall transfer level: Needs assistance Equipment used: Rolling walker (2 wheeled) Transfers: Sit to/from Stand Sit to Stand: Min assist         General transfer comment: cues for hand placement, assist to rise and steady    Balance Overall balance assessment: Needs assistance   Sitting balance-Leahy Scale: Good       Standing balance-Leahy Scale: Poor                             ADL either performed or assessed with clinical judgement   ADL Overall ADL's : Needs assistance/impaired Eating/Feeding: Independent   Grooming: Brushing hair;Moderate assistance;Sitting;Wash/dry hands;Wash/dry face;Applying deodorant;Set up    Upper Body Bathing: Set up;Sitting   Lower Body Bathing: Moderate assistance;Sit to/from stand   Upper Body Dressing : Set up;Sitting   Lower Body Dressing: Moderate assistance;Sit to/from stand   Toilet Transfer: Minimal assistance;RW;Ambulation   Toileting- Clothing Manipulation and Hygiene: Minimal assistance;Sit to/from stand       Functional mobility during ADLs: Minimal assistance;Rolling walker       Vision Baseline Vision/History: Wears glasses Wears Glasses: At all times Patient Visual Report: No change from baseline       Perception     Praxis      Pertinent Vitals/Pain Pain Assessment: Faces Faces Pain Scale: Hurts whole lot Pain Location: R LE Pain Descriptors / Indicators: Numbness;Tightness;Grimacing;Guarding Pain Intervention(s): Monitored during session;Repositioned     Hand Dominance Right   Extremity/Trunk Assessment Upper Extremity Assessment Upper Extremity Assessment: Generalized weakness   Lower Extremity Assessment Lower Extremity Assessment: Defer to PT evaluation       Communication Communication Communication: No difficulties   Cognition Arousal/Alertness: Awake/alert Behavior During Therapy: Flat affect;Impulsive Overall Cognitive Status: Impaired/Different from baseline Area of Impairment: Safety/judgement                                   General Comments       Exercises     Shoulder Instructions      Home Living Family/patient expects to be discharged  to:: Private residence Living Arrangements: Alone   Type of Home: Apartment Home Access: Stairs to enter [employer] of Steps: I don't know, I never counted Entrance Stairs-Rails: Right Home Layout: One level     Bathroom Shower/Tub: Teacher, early years/pre: Standard     Home Equipment: Environmental consultant - 4 wheels          Prior Functioning/Environment Level of Independence: Independent with assistive device(s)                  OT Problem List: Decreased strength;Decreased activity tolerance;Impaired balance (sitting and/or standing);Decreased safety awareness;Decreased knowledge of use of DME or AE;Pain;Cardiopulmonary status limiting activity      OT Treatment/Interventions: Self-care/ADL training;DME and/or AE instruction;Therapeutic activities;Patient/family education;Balance training    OT Goals(Current goals can be found in the care plan section) Acute Rehab OT Goals Patient Stated Goal: to eat and get pain meds OT Goal Formulation: With patient Time For Goal Achievement: 06/19/17 Potential to Achieve Goals: Good ADL Goals Pt Will Perform Grooming: (P) with modified independence;standing Pt Will Perform Lower Body Bathing: (P) with modified independence;with adaptive equipment Pt Will Perform Lower Body Dressing: (P) with modified independence;with adaptive equipment Pt Will Transfer to Toilet: (P) with modified independence;ambulating;regular height toilet Pt Will Perform Toileting - Clothing Manipulation and hygiene: (P) with modified independence;sit to/from stand  OT Frequency: Min 2X/week   Barriers to D/C:            Co-evaluation              AM-PAC PT "6 Clicks" Daily Activity     Outcome Measure Help from another person eating meals?: None Help from another person taking care of personal grooming?: A Little Help from another person toileting, which includes using toliet, bedpan, or urinal?: A Little Help from another person bathing (including washing, rinsing, drying)?: A Lot Help from another person to put on and taking off regular upper body clothing?: None Help from another person to put on and taking off regular lower body clothing?: A Lot 6 Click Score: 18   End of Session Equipment Utilized During Treatment: Gait belt;Rolling walker Nurse Communication: Patient requests pain meds(has not had breakfast tray)  Activity Tolerance: Patient limited by pain Patient  left: in bed;with call bell/phone within reach  OT Visit Diagnosis: Unsteadiness on feet (R26.81);Other abnormalities of gait and mobility (R26.89);Pain                Time: 1610-9604 OT Time Calculation (min): 27 min Charges:  OT General Charges $OT Visit: 1 Visit OT Evaluation $OT Eval Moderate Complexity: 1 Mod OT Treatments $Self Care/Home Management : 8-22 mins G-Codes:     06/24/2017 Nestor Lewandowsky, OTR/L Pager: [phone] Werner Lean, Haze Boyden 06-24-2017, 9:36 AM

## 2017-06-05 NOTE — Plan of Care (Signed)
  Activity: Risk for activity intolerance will decrease 06/05/2017 2253 - Progressing by Jaymes Graff, RN Pt up w/ PT today, bed to chair transfer w/ RN this pm. Tolerated fairly well, pain limiting movement.   Pain Managment: General experience of comfort will improve 06/05/2017 2253 - Not Progressing by Jaymes Graff, RN Pain consistently 8-9/10, worse w/ movement, pain better w/ rest. States 5mg  Oxy doesn't touch it, some relief w/ PRN morphine. Has required several doses of this tonight.

## 2017-06-05 NOTE — Progress Notes (Addendum)
[facility]  Subjective  - Pain issues with moving his left leg.   Objective (!) 100/55 80 99.1 F (37.3 C) (Oral) 18 94%  Intake/Output Summary (Last 24 hours) at 06/05/2017 0734 Last data filed at 06/05/2017 0500 Gross per 24 hour  Intake 5256.67 ml  Output 1025 ml  Net 4231.67 ml    Doppler Peroneal/PT/DP Incisions soft without hematoma Heart RRR Lungs non labored breathing  Assessment/Planning: POD # 1 s/p R femoral to AK popliteal bypass with GSV conduit and 3rd toe amputation  Patent by pass PT for mobility Pending ABI's Dry dressing daily to left foot with guaze between the toes. Cr elevated 1.76, fluids, NS @ 100 UO 50 cc/hr   Roxy Horseman 06/05/2017 7:34 AM --  Laboratory Lab Results: Recent Labs    06/04/17 0332 06/05/17 0301  WBC 16.0* 13.9*  HGB 10.4* 9.5*  HCT 32.1* 28.7*  PLT 216 195   BMET Recent Labs    06/04/17 0332 06/05/17 0301  NA 133* 133*  K 4.0 4.2  CL 98* 103  CO2 25 23  GLUCOSE 131* 94  BUN 20 23*  CREATININE 1.38* 1.76*  CALCIUM 8.2* 7.7*    COAG Lab Results  Component Value Date   INR 1.25 06/04/2017   INR 1.18 06/03/2017   No results found for: PTT  I have examined the patient, reviewed and agree with above.  Comfortable this morning.  Typical surgical incisional soreness.  Labs all stable.  Palpable popliteal pulse.  Good Doppler flow in his foot.  We will begin normal saline wet-to-dry dressings to his right open toe amputation site twice daily.  Home when comfortable walking.  Curt Jews, MD 06/05/2017 8:24 AM

## 2017-06-06 LAB — CBC WITH DIFFERENTIAL/PLATELET
BASOS ABS: 0 10*3/uL (ref 0.0–0.1)
BASOS PCT: 0 %
EOS ABS: 0.2 10*3/uL (ref 0.0–0.7)
EOS PCT: 1 %
HCT: 29.2 % — ABNORMAL LOW (ref 39.0–52.0)
Hemoglobin: 9.4 g/dL — ABNORMAL LOW (ref 13.0–17.0)
Lymphocytes Relative: 11 %
Lymphs Abs: 1.6 10*3/uL (ref 0.7–4.0)
MCH: 30.7 pg (ref 26.0–34.0)
MCHC: 32.2 g/dL (ref 30.0–36.0)
MCV: 95.4 fL (ref 78.0–100.0)
MONO ABS: 1.1 10*3/uL — AB (ref 0.1–1.0)
Monocytes Relative: 8 %
Neutro Abs: 11 10*3/uL — ABNORMAL HIGH (ref 1.7–7.7)
Neutrophils Relative %: 80 %
PLATELETS: 222 10*3/uL (ref 150–400)
RBC: 3.06 MIL/uL — AB (ref 4.22–5.81)
RDW: 13.3 % (ref 11.5–15.5)
WBC: 13.9 10*3/uL — AB (ref 4.0–10.5)

## 2017-06-06 LAB — COMPREHENSIVE METABOLIC PANEL
ALBUMIN: 1.8 g/dL — AB (ref 3.5–5.0)
ALK PHOS: 109 U/L (ref 38–126)
ALT: 15 U/L — ABNORMAL LOW (ref 17–63)
ANION GAP: 7 (ref 5–15)
AST: 23 U/L (ref 15–41)
BILIRUBIN TOTAL: 0.6 mg/dL (ref 0.3–1.2)
BUN: 28 mg/dL — AB (ref 6–20)
CALCIUM: 8.2 mg/dL — AB (ref 8.9–10.3)
CO2: 24 mmol/L (ref 22–32)
Chloride: 102 mmol/L (ref 101–111)
Creatinine, Ser: 1.81 mg/dL — ABNORMAL HIGH (ref 0.61–1.24)
GFR calc Af Amer: 43 mL/min — ABNORMAL LOW (ref >60)
GFR, EST NON AFRICAN AMERICAN: 37 mL/min — AB (ref >60)
GLUCOSE: 158 mg/dL — AB (ref 65–99)
POTASSIUM: 4.3 mmol/L (ref 3.5–5.1)
Sodium: 133 mmol/L — ABNORMAL LOW (ref 135–145)
TOTAL PROTEIN: 5.8 g/dL — AB (ref 6.5–8.1)

## 2017-06-06 LAB — BASIC METABOLIC PANEL
ANION GAP: 10 (ref 5–15)
BUN: 30 mg/dL — ABNORMAL HIGH (ref 6–20)
CALCIUM: 8.4 mg/dL — AB (ref 8.9–10.3)
CO2: 19 mmol/L — AB (ref 22–32)
CREATININE: 1.65 mg/dL — AB (ref 0.61–1.24)
Chloride: 103 mmol/L (ref 101–111)
GFR, EST AFRICAN AMERICAN: 48 mL/min — AB (ref >60)
GFR, EST NON AFRICAN AMERICAN: 41 mL/min — AB (ref >60)
Glucose, Bld: 123 mg/dL — ABNORMAL HIGH (ref 65–99)
Potassium: 4.8 mmol/L (ref 3.5–5.1)
SODIUM: 132 mmol/L — AB (ref 135–145)

## 2017-06-06 LAB — URINALYSIS, COMPLETE (UACMP) WITH MICROSCOPIC
Bilirubin Urine: NEGATIVE
Glucose, UA: NEGATIVE mg/dL
Ketones, ur: NEGATIVE mg/dL
NITRITE: NEGATIVE
PH: 5 (ref 5.0–8.0)
Protein, ur: 100 mg/dL — AB
SPECIFIC GRAVITY, URINE: 1.026 (ref 1.005–1.030)

## 2017-06-06 LAB — GLUCOSE, CAPILLARY
GLUCOSE-CAPILLARY: 130 mg/dL — AB (ref 65–99)
GLUCOSE-CAPILLARY: 161 mg/dL — AB (ref 65–99)
GLUCOSE-CAPILLARY: 156 mg/dL — AB (ref 65–99)
GLUCOSE-CAPILLARY: 106 mg/dL — AB (ref 65–99)

## 2017-06-06 LAB — SODIUM, URINE, RANDOM: SODIUM UR: 30 mmol/L

## 2017-06-06 LAB — OSMOLALITY, URINE: Osmolality, Ur: 540 mOsm/kg (ref 300–900)

## 2017-06-06 LAB — CREATININE, URINE, RANDOM: Creatinine, Urine: 176.4 mg/dL

## 2017-06-06 MED ORDER — FUROSEMIDE 10 MG/ML IJ SOLN
20.0000 mg | Freq: Once | INTRAMUSCULAR | Status: AC
  Administered 2017-06-06: 20 mg via INTRAVENOUS
  Filled 2017-06-06: qty 2

## 2017-06-06 MED ORDER — HEPARIN SODIUM (PORCINE) 5000 UNIT/ML IJ SOLN
5000.0000 [IU] | Freq: Three times a day (TID) | INTRAMUSCULAR | Status: DC
  Administered 2017-06-06 – 2017-06-17 (×32): 5000 [IU] via SUBCUTANEOUS
  Filled 2017-06-06 (×32): qty 1

## 2017-06-06 MED ORDER — ALBUMIN HUMAN 5 % IV SOLN
12.5000 g | Freq: Once | INTRAVENOUS | Status: AC
  Administered 2017-06-06: 12.5 g via INTRAVENOUS
  Filled 2017-06-06: qty 250

## 2017-06-06 MED ORDER — PRO-STAT SUGAR FREE PO LIQD
30.0000 mL | Freq: Two times a day (BID) | ORAL | Status: DC
  Administered 2017-06-06 – 2017-06-08 (×6): 30 mL via ORAL
  Filled 2017-06-06 (×15): qty 30

## 2017-06-06 NOTE — Progress Notes (Signed)
Occupational Therapy Treatment Patient Details Name: Adrian Bean MRN: [id number] DOB: April 09, 1951 Today's Date: 06/06/2017    History of present illness Pt is a 66 y.o. male s/p R femoral to AK popliteal bypass graft and R 3rd toe amputation. PMH: HTN, DM, PVD with L toe amputations, renal disease, current smoker.   OT comments  Pt considerably weaker today. 02 sats down to 76% on RA, requiring 3L to rebound to 95%. Unable to pick up R foot in standing to ambulate to chair after ADL at EOB, squat pivoted with mod assist. RN aware pt of pt's change in status. Will continue to follow.  Follow Up Recommendations  Home health OT    Equipment Recommendations       Recommendations for Other Services      Precautions / Restrictions Precautions Precautions: Fall Restrictions Weight Bearing Restrictions: No       Mobility Bed Mobility               General bed mobility comments: seated at EOB  Transfers Overall transfer level: Needs assistance Equipment used: Rolling walker (2 wheeled) Transfers: Sit to/from [employer] Sit to Stand: Min assist   Squat pivot transfers: Mod assist     General transfer comment: assist to rise and steady, flexed posture, unable to pick up R foot    Balance Overall balance assessment: Needs assistance   Sitting balance-Leahy Scale: Fair       Standing balance-Leahy Scale: Poor Standing balance comment: unable to statically stand without B UE support                           ADL either performed or assessed with clinical judgement   ADL Overall ADL's : Needs assistance/impaired     Grooming: Brushing hair;Sitting;Maximal assistance           Upper Body Dressing : Moderate assistance;Sitting                   Functional mobility during ADLs: (unable to ambulate)       Vision       Perception     Praxis      Cognition Arousal/Alertness: Lethargic Behavior During Therapy: Flat  affect Overall Cognitive Status: Within Functional Limits for tasks assessed                                 General Comments: pt off 02 upon arrival, 02 sats at 76%, replaced 3L and rebounded to 95%        Exercises     Shoulder Instructions       General Comments      Pertinent Vitals/ Pain       Pain Assessment: No/denies pain  Home Living                                          Prior Functioning/Environment              Frequency  Min 2X/week        Progress Toward Goals  OT Goals(current goals can now be found in the care plan section)  Progress towards OT goals: Not progressing toward goals - comment(considerably weaker)  Acute Rehab OT Goals Patient Stated Goal: Go home OT Goal Formulation: With patient Time  For Goal Achievement: 06/19/17 Potential to Achieve Goals: Good  Plan Discharge plan remains appropriate(may need to update depending on progress)    Co-evaluation                 AM-PAC PT "6 Clicks" Daily Activity     Outcome Measure   Help from another person eating meals?: None Help from another person taking care of personal grooming?: A Little Help from another person toileting, which includes using toliet, bedpan, or urinal?: A Lot Help from another person bathing (including washing, rinsing, drying)?: A Lot Help from another person to put on and taking off regular upper body clothing?: A Lot Help from another person to put on and taking off regular lower body clothing?: A Lot 6 Click Score: 15    End of Session Equipment Utilized During Treatment: Gait belt;Rolling walker  OT Visit Diagnosis: Unsteadiness on feet (R26.81);Other abnormalities of gait and mobility (R26.89);Pain   Activity Tolerance Patient limited by lethargy   Patient Left in chair;with call bell/phone within reach   Nurse Communication (NT--made aware of necessity to keep 02 on)        Time: 0922-0948 OT Time  Calculation (min): 26 min  Charges: OT General Charges $OT Visit: 1 Visit OT Treatments $Self Care/Home Management : 23-37 mins  06/06/2017 Nestor Lewandowsky, OTR/L Pager: [phone] Werner Lean, Haze Boyden 06/06/2017, 10:00 AM

## 2017-06-06 NOTE — Progress Notes (Signed)
[facility] Progress Note  Patient: Adrian Bean IOE:[id number]   PCP: [name], MD DOB: June 15, 1951   DOA: 06/02/2017   DOS: 06/06/2017   Date of Service: the patient was seen and examined on 06/06/2017  Subjective: Complains about shortness of breath.  Fatigue.  Also constipation.  No nausea no vomiting.  Brief hospital course: Pt. with PMH of type II DM, neuropathy, PAD, left foot amputation, tobacco abuse; admitted on 06/02/2017, presented with complaint of right leg wound on third toe, was found to have dry gangrene.  Initially admitted at [facility] for cellulitis and gangrene and started on IV antibiotics.  CTA showed extensive bilateral PVD and patient was transferred to [facility] after discussion with vascular surgery.  Underwent bilateral angiogram and now scheduled for aortofemoral popliteal bypass. Currently further plan is follow-up on postoperative course.  Assessment and Plan: 1.  Right third toe gangrene.   S/P amputation of the right third toe.  06/04/2017 Right femoral to popliteal bypass.  06/04/2017 Peripheral vascular disease. Sepsis secondary to cellulitis. 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  Presented with sepsis physiology in the outside facility currently resolved. Started on IV antibiotics, continues to have leukocytosis.  Now getting better MRSA PCR is negative. Initially on IV vancomycin and Zosyn.  Switch to Unasyn and stopped on 06/06/2017  2.  Type 2 diabetes mellitus. Hemoglobin A1c 6.3. Diabetic neuropathy. Continue sliding scale insulin. Continue gabapentin. Holding home oral hypoglycemic agents.  3.  Active smoker. Discussed importance of quitting. Continue nicotine patch.  4.  Hepatic flexure of the colon thickening. Incidentally noted on the CT scan. Will require outpatient GI follow-up with colonoscopy.  5.  Acute hypoxic respiratory failure. On 3L of oxygen. Does not  use oxygen at his baseline. Clear to auscultation.   Vascular congestion noted on chest x-ray.   IV Lasix 20 mg x1. Wean to room air.  Incentive spirometry.  6.  Fever on 06/03/2017. Postoperative fever most likely atelectasis versus effective anesthesia. Already on antibiotic. Check chest x-ray.  7.  Acute kidney injury. Hypoalbuminemia with third spacing. Serum creatinine elevated. On IV fluids.  Despite which the creatinine has worsened. Will give IV albumin and Lasix x1. Monitor.  Diet: Carb modified diet DVT Prophylaxis: subcutaneous Heparin  Advance goals of care discussion: full code  Family Communication: no family was present at bedside, at the time of interview.   Disposition:  Discharge to home with home health once creatinine better, no fever for 24 hours and able to walk.  Consultants: vascular surgery Procedures: Bilateral angiogram.  Femoral-popliteal bypass  Antibiotics: Anti-infectives (From admission, onward)   Start     Dose/Rate Route Frequency Ordered Stop   06/05/17 1130  ampicillin-sulbactam (UNASYN) 1.5 g in sodium chloride 0.9 % 50 mL IVPB  Status:  Discontinued     1.5 g 100 mL/hr over 30 Minutes Intravenous Every 6 hours 06/05/17 0841 06/06/17 1308   06/03/17 0800  ceFAZolin (ANCEF) IVPB 1 g/50 mL premix     1 g 100 mL/hr over 30 Minutes Intravenous On call 06/02/17 1704 06/04/17 0800   06/03/17 0600  vancomycin (VANCOCIN) IVPB 1000 mg/200 mL premix  Status:  Discontinued     1,000 mg 200 mL/hr over 60 Minutes Intravenous Every 12 hours 06/02/17 1917 06/05/17 0756   06/02/17 2000  piperacillin-tazobactam (ZOSYN) IVPB 3.375 g  Status:  Discontinued     3.375 g 12.5 mL/hr over 240 Minutes Intravenous Every 8  hours 06/02/17 1912 06/05/17 0757       Objective: Physical Exam: Vitals:   06/06/17 0200 06/06/17 0400 06/06/17 0825 06/06/17 1100  BP: 114/65 131/79 116/67   Pulse:  93    Resp: 14 20 10 16   Temp:  98.9 F (37.2 C) (!) 100.9 F  (38.3 C) 99.7 F (37.6 C)  TempSrc:  Oral Oral Oral  SpO2: 93% 90% 92% 94%  Weight:      Height:        Intake/Output Summary (Last 24 hours) at 06/06/2017 1348 Last data filed at 06/06/2017 0825 Gross per 24 hour  Intake 1716.67 ml  Output 150 ml  Net 1566.67 ml   Filed Weights   06/02/17 1830 06/04/17 1008  Weight: 80.3 kg (177 lb) 80.3 kg (177 lb)   General: Alert, Awake and Oriented to Time, Place and Person. Appear in mild distress, affect appropriate ENT: Oral Mucosa clear moist. Neck: NO JVD, NO Abnormal Mass Or lumps Cardiovascular: S1 and S2 Present, NO Murmur, Peripheral Pulses Present Respiratory: normal respiratory effort, Bilateral Air entry equal and Decreased, no use of accessory muscle, Clear to Auscultation, no Crackles, no wheezes Abdomen: Bowel Sound present, Soft and no tenderness, no hernia Skin: no redness, no Rash, no induration Extremities: no Pedal edema, no calf tenderness Neurologic: Grossly no focal neuro deficit. Bilaterally Equal motor strength  Data Reviewed: CBC: Recent Labs  Lab 06/03/17 0422 06/04/17 0332 06/05/17 0301 06/06/17 0134  WBC 15.7* 16.0* 13.9* 13.9*  NEUTROABS  --   --   --  11.0*  HGB 10.9* 10.4* 9.5* 9.4*  HCT 32.8* 32.1* 28.7* 29.2*  MCV 93.4 93.3 94.1 95.4  PLT 202 216 195 540   Basic Metabolic Panel: Recent Labs  Lab 06/03/17 0422 06/04/17 0332 06/05/17 0301 06/06/17 0134 06/06/17 0833  NA 134* 133* 133* 133* 132*  K 4.1 4.0 4.2 4.3 4.8  CL 98* 98* 103 102 103  CO2 24 25 23 24  19*  GLUCOSE 138* 131* 94 158* 123*  BUN 23* 20 23* 28* 30*  CREATININE 1.39* 1.38* 1.76* 1.81* 1.65*  CALCIUM 8.5* 8.2* 7.7* 8.2* 8.4*    Liver Function Tests: Recent Labs  Lab 06/06/17 0134  AST 23  ALT 15*  ALKPHOS 109  BILITOT 0.6  PROT 5.8*  ALBUMIN 1.8*   No results for input(s): LIPASE, AMYLASE in the last 168 hours. No results for input(s): AMMONIA in the last 168 hours. Coagulation Profile: Recent Labs  Lab  06/03/17 0422 06/04/17 0332  INR 1.18 1.25   Cardiac Enzymes: No results for input(s): CKTOTAL, CKMB, CKMBINDEX, TROPONINI in the last 168 hours. BNP (last 3 results) No results for input(s): PROBNP in the last 8760 hours. CBG: Recent Labs  Lab 06/05/17 1141 06/05/17 1628 06/05/17 2058 06/06/17 0559 06/06/17 1116  GLUCAP 166* 117* 114* 130* 161*   Studies: No results found.  Scheduled Meds: . aspirin EC  81 mg Oral Daily  . atorvastatin  10 mg Oral q1800  . docusate sodium  100 mg Oral BID  . feeding supplement (GLUCERNA SHAKE)  237 mL Oral TID BM  . feeding supplement (PRO-STAT SUGAR FREE 64)  30 mL Oral BID  . furosemide  20 mg Intravenous Once  . gabapentin  600 mg Oral TID  . heparin injection (subcutaneous)  5,000 Units Subcutaneous Q8H  . insulin aspart  0-15 Units Subcutaneous TID WC  . multivitamin with minerals  1 tablet Oral Daily  . nicotine  14 mg Transdermal  Daily  . pantoprazole  40 mg Oral Daily  . sodium chloride flush  3 mL Intravenous Q12H   Continuous Infusions: . sodium chloride    . sodium chloride 100 mL/hr at 06/06/17 1223  . sodium chloride    . magnesium sulfate 1 - 4 g bolus IVPB     PRN Meds: sodium chloride, sodium chloride, acetaminophen **OR** acetaminophen, magnesium sulfate 1 - 4 g bolus IVPB, morphine injection, ondansetron **OR** ondansetron (ZOFRAN) IV, oxyCODONE, polyethylene glycol, sodium chloride flush  Time spent: 35 minutes  Author: Berle Mull, MD [facility] Pager: [phone] 06/06/2017 1:48 PM  If 7PM-7AM, please contact night-coverage at [email or web address], password [facility]

## 2017-06-06 NOTE — Care Management Important Message (Signed)
Important Message  Patient Details  Name: Adrian Bean MRN: [id number] Date of Birth: [birth year]   Medicare Important Message Given:  Yes    Nathen May 06/06/2017, 9:57 AM

## 2017-06-06 NOTE — Progress Notes (Addendum)
  Progress Note    06/06/2017 7:50 AM 2 Days Post-Op  Subjective:  No complaints  Tm 99.1 now 98.9 HR 80's-90's NSR 518'A-416'S systolic 06% [id number]  Vitals:   06/06/17 0200 06/06/17 0400  BP: 114/65 131/79  Pulse:  93  Resp: 14 20  Temp:  98.9 F (37.2 C)  SpO2: 93% 90%    Physical Exam: Cardiac:  Regular Lungs:  Non labored Incisions:  Clean and dry Extremities:  RLE swelling;  brisk doppler signal right PT/peroneal > DP 3rd toe amp site-clean   CBC    Component Value Date/Time   WBC 13.9 (H) 06/06/2017 0134   RBC 3.06 (L) 06/06/2017 0134   HGB 9.4 (L) 06/06/2017 0134   HCT 29.2 (L) 06/06/2017 0134   PLT 222 06/06/2017 0134   MCV 95.4 06/06/2017 0134   MCH 30.7 06/06/2017 0134   MCHC 32.2 06/06/2017 0134   RDW 13.3 06/06/2017 0134   LYMPHSABS 1.6 06/06/2017 0134   MONOABS 1.1 (H) 06/06/2017 0134   EOSABS 0.2 06/06/2017 0134   BASOSABS 0.0 06/06/2017 0134    BMET    Component Value Date/Time   NA 133 (L) 06/06/2017 0134   K 4.3 06/06/2017 0134   CL 102 06/06/2017 0134   CO2 24 06/06/2017 0134   GLUCOSE 158 (H) 06/06/2017 0134   BUN 28 (H) 06/06/2017 0134   CREATININE 1.81 (H) 06/06/2017 0134   CALCIUM 8.2 (L) 06/06/2017 0134   GFRNONAA 37 (L) 06/06/2017 0134   GFRAA 43 (L) 06/06/2017 0134    INR    Component Value Date/Time   INR 1.25 06/04/2017 0332     Intake/Output Summary (Last 24 hours) at 06/06/2017 0750 Last data filed at 06/06/2017 0446 Gross per 24 hour  Intake 1956.67 ml  Output 600 ml  Net 1356.67 ml     Assessment:  66 y.o. male is s/p:  R femoral to AK popliteal bypass with GSV conduit and 3rd toe amputation  2 Days Post-Op  Plan: -patent bypass graft with brisk doppler signal right PT/peroneal > DP -creatinine still elevated-will change Lovenox to SQ heparin and defer TRH -dressing removed from 3rd toe amp site-clean.  Continue wet to dry dressing changes bid -swelling right leg not surprising given GSV harvest.   Discussed with pt keeping leg elevated while not up and about. -continue to mobilize -check labs in am   Leontine Locket, PA-C [facility] [phone] 06/06/2017 7:50 AM   Agree with above.  Will stop antibiotics today. Home when more ambulatory  Ruta Hinds, MD [facility] [place] Office: [phone] Pager: [phone]

## 2017-06-07 LAB — GLUCOSE, CAPILLARY
GLUCOSE-CAPILLARY: 157 mg/dL — AB (ref 65–99)
Glucose-Capillary: 130 mg/dL — ABNORMAL HIGH (ref 65–99)
Glucose-Capillary: 121 mg/dL — ABNORMAL HIGH (ref 65–99)
Glucose-Capillary: 144 mg/dL — ABNORMAL HIGH (ref 65–99)

## 2017-06-07 LAB — BASIC METABOLIC PANEL
ANION GAP: 7 (ref 5–15)
BUN: 33 mg/dL — ABNORMAL HIGH (ref 6–20)
CALCIUM: 8.1 mg/dL — AB (ref 8.9–10.3)
CHLORIDE: 104 mmol/L (ref 101–111)
CO2: 23 mmol/L (ref 22–32)
Creatinine, Ser: 1.52 mg/dL — ABNORMAL HIGH (ref 0.61–1.24)
GFR calc non Af Amer: 45 mL/min — ABNORMAL LOW (ref >60)
GFR, EST AFRICAN AMERICAN: 53 mL/min — AB (ref >60)
GLUCOSE: 120 mg/dL — AB (ref 65–99)
Potassium: 4.4 mmol/L (ref 3.5–5.1)
Sodium: 134 mmol/L — ABNORMAL LOW (ref 135–145)

## 2017-06-07 LAB — CBC
HEMATOCRIT: 29.4 % — AB (ref 39.0–52.0)
HEMOGLOBIN: 9.4 g/dL — AB (ref 13.0–17.0)
MCH: 30.3 pg (ref 26.0–34.0)
MCHC: 32 g/dL (ref 30.0–36.0)
MCV: 94.8 fL (ref 78.0–100.0)
Platelets: 245 10*3/uL (ref 150–400)
RBC: 3.1 MIL/uL — ABNORMAL LOW (ref 4.22–5.81)
RDW: 13.1 % (ref 11.5–15.5)
WBC: 13.4 10*3/uL — ABNORMAL HIGH (ref 4.0–10.5)

## 2017-06-07 LAB — MAGNESIUM: Magnesium: 1.8 mg/dL (ref 1.7–2.4)

## 2017-06-07 MED ORDER — ALBUTEROL SULFATE (2.5 MG/3ML) 0.083% IN NEBU: 2.5000 mg | INHALATION_SOLUTION | RESPIRATORY_TRACT | Status: DC | PRN

## 2017-06-07 MED ORDER — POLYETHYLENE GLYCOL 3350 17 G PO PACK
17.0000 g | PACK | Freq: Two times a day (BID) | ORAL | Status: DC
  Administered 2017-06-07 – 2017-06-08 (×3): 17 g via ORAL
  Filled 2017-06-07 (×12): qty 1

## 2017-06-07 MED ORDER — ALBUMIN HUMAN 25 % IV SOLN
12.5000 g | Freq: Once | INTRAVENOUS | Status: AC
  Administered 2017-06-07: 12.5 g via INTRAVENOUS
  Filled 2017-06-07: qty 100

## 2017-06-07 MED ORDER — IPRATROPIUM-ALBUTEROL 0.5-2.5 (3) MG/3ML IN SOLN
3.0000 mL | Freq: Three times a day (TID) | RESPIRATORY_TRACT | Status: DC
  Administered 2017-06-07 – 2017-06-12 (×18): 3 mL via RESPIRATORY_TRACT
  Filled 2017-06-07 (×18): qty 3

## 2017-06-07 MED ORDER — FUROSEMIDE 10 MG/ML IJ SOLN
40.0000 mg | Freq: Once | INTRAMUSCULAR | Status: AC
  Administered 2017-06-07: 40 mg via INTRAVENOUS
  Filled 2017-06-07: qty 4

## 2017-06-07 MED ORDER — IPRATROPIUM-ALBUTEROL 0.5-2.5 (3) MG/3ML IN SOLN: 3.0000 mL | Freq: Four times a day (QID) | RESPIRATORY_TRACT | Status: DC

## 2017-06-07 MED ORDER — SENNOSIDES-DOCUSATE SODIUM 8.6-50 MG PO TABS
2.0000 | ORAL_TABLET | Freq: Two times a day (BID) | ORAL | Status: DC
  Administered 2017-06-07 – 2017-06-17 (×9): 2 via ORAL
  Filled 2017-06-07 (×16): qty 2

## 2017-06-07 NOTE — Progress Notes (Signed)
[facility] Progress Note  Patient: Adrian Bean DDU:[id number]   PCP: [name], MD DOB: Jun 25, 1951   DOA: 06/02/2017   DOS: 06/07/2017   Date of Service: the patient was seen and examined on 06/07/2017  Subjective: has shortnes of breath, no chest pain, has some dry cough  Brief hospital course: Pt. with PMH of type II DM, neuropathy, PAD, left foot amputation, tobacco abuse; admitted on 06/02/2017, presented with complaint of right leg wound on third toe, was found to have dry gangrene.  Initially admitted at [facility] for cellulitis and gangrene and started on IV antibiotics.  CTA showed extensive bilateral PVD and patient was transferred to [facility] after discussion with vascular surgery.  Underwent bilateral angiogram and now scheduled for aortofemoral popliteal bypass. Currently further plan is follow-up on postoperative course.  Assessment and Plan: 1.  Right third toe gangrene.   S/P amputation of the right third toe.  06/04/2017 Right femoral to popliteal bypass.  06/04/2017 Peripheral vascular disease. Sepsis secondary to cellulitis. 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  Presented with sepsis physiology in the outside facility currently resolved. Started on IV antibiotics, continues to have leukocytosis.  Now getting better MRSA PCR is negative. Initially on IV vancomycin and Zosyn.  Switch to Unasyn and stopped on 06/06/2017  2.  Type 2 diabetes mellitus. Hemoglobin A1c 6.3. Diabetic neuropathy. Continue sliding scale insulin. Continue gabapentin. Holding home oral hypoglycemic agents.  3.  Active smoker. Discussed importance of quitting. Continue nicotine patch.  4.  Hepatic flexure of the colon thickening. Incidentally noted on the CT scan. Will require outpatient GI follow-up with colonoscopy.  5.  Acute hypoxic respiratory failure. On 3L of oxygen. Does not use oxygen at his baseline. Clear  to auscultation.   Vascular congestion noted on chest x-ray.   IV Lasix 20 mg x1. Wean to room air.  Incentive spirometry.  6.  Fever on 06/03/2017. Postoperative fever most likely atelectasis versus effective anesthesia. Already on antibiotic. Check chest x-ray.  7.  Acute kidney injury. Hypoalbuminemia with third spacing. Serum creatinine elevated.  Now better after diuresis On IV fluids.  Despite which the creatinine has worsened. Will give IV albumin and Lasix x1. Monitor.  Diet: Carb modified diet DVT Prophylaxis: subcutaneous Heparin  Advance goals of care discussion: full code  Family Communication: no family was present at bedside, at the time of interview.   Disposition:  Discharge to home with home health once creatinine better, no fever for 24 hours and able to walk.  Consultants: vascular surgery Procedures: Bilateral angiogram.  Femoral-popliteal bypass  Antibiotics: Anti-infectives (From admission, onward)   Start     Dose/Rate Route Frequency Ordered Stop   06/05/17 1130  ampicillin-sulbactam (UNASYN) 1.5 g in sodium chloride 0.9 % 50 mL IVPB  Status:  Discontinued     1.5 g 100 mL/hr over 30 Minutes Intravenous Every 6 hours 06/05/17 0841 06/06/17 1308   06/03/17 0800  ceFAZolin (ANCEF) IVPB 1 g/50 mL premix     1 g 100 mL/hr over 30 Minutes Intravenous On call 06/02/17 1704 06/04/17 0800   06/03/17 0600  vancomycin (VANCOCIN) IVPB 1000 mg/200 mL premix  Status:  Discontinued     1,000 mg 200 mL/hr over 60 Minutes Intravenous Every 12 hours 06/02/17 1917 06/05/17 0756   06/02/17 2000  piperacillin-tazobactam (ZOSYN) IVPB 3.375 g  Status:  Discontinued     3.375 g 12.5 mL/hr over 240 Minutes Intravenous Every  8 hours 06/02/17 1912 06/05/17 0757       Objective: Physical Exam: Vitals:   06/07/17 0512 06/07/17 0800 06/07/17 1001 06/07/17 1455  BP: 129/83     Pulse: 80     Resp: 12 12    Temp: 98.2 F (36.8 C)     TempSrc: Oral     SpO2: 92% 90%  100% 99%  Weight:      Height:        Intake/Output Summary (Last 24 hours) at 06/07/2017 1647 Last data filed at 06/07/2017 0900 Gross per 24 hour  Intake 1640 ml  Output 870 ml  Net 770 ml   Filed Weights   06/02/17 1830 06/04/17 1008  Weight: 80.3 kg (177 lb) 80.3 kg (177 lb)   General: Alert, Awake and Oriented to Time, Place and Person. Appear in mild distress, affect appropriate ENT: Oral Mucosa clear moist. Neck: NO JVD, NO Abnormal Mass Or lumps Cardiovascular: S1 and S2 Present, NO Murmur, Peripheral Pulses Present Respiratory: normal respiratory effort, Bilateral Air entry equal and Decreased, no use of accessory muscle, Clear to Auscultation, no Crackles, no wheezes Abdomen: Bowel Sound present, Soft and no tenderness, no hernia Skin: no redness, no Rash, no induration Extremities: no Pedal edema, no calf tenderness Neurologic: Grossly no focal neuro deficit. Bilaterally Equal motor strength  Data Reviewed: CBC: Recent Labs  Lab 06/03/17 0422 06/04/17 0332 06/05/17 0301 06/06/17 0134 06/07/17 0224  WBC 15.7* 16.0* 13.9* 13.9* 13.4*  NEUTROABS  --   --   --  11.0*  --   HGB 10.9* 10.4* 9.5* 9.4* 9.4*  HCT 32.8* 32.1* 28.7* 29.2* 29.4*  MCV 93.4 93.3 94.1 95.4 94.8  PLT 202 216 195 222 528   Basic Metabolic Panel: Recent Labs  Lab 06/04/17 0332 06/05/17 0301 06/06/17 0134 06/06/17 0833 06/07/17 0224  NA 133* 133* 133* 132* 134*  K 4.0 4.2 4.3 4.8 4.4  CL 98* 103 102 103 104  CO2 25 23 24  19* 23  GLUCOSE 131* 94 158* 123* 120*  BUN 20 23* 28* 30* 33*  CREATININE 1.38* 1.76* 1.81* 1.65* 1.52*  CALCIUM 8.2* 7.7* 8.2* 8.4* 8.1*  MG  --   --   --   --  1.8    Liver Function Tests: Recent Labs  Lab 06/06/17 0134  AST 23  ALT 15*  ALKPHOS 109  BILITOT 0.6  PROT 5.8*  ALBUMIN 1.8*   No results for input(s): LIPASE, AMYLASE in the last 168 hours. No results for input(s): AMMONIA in the last 168 hours. Coagulation Profile: Recent Labs  Lab  06/03/17 0422 06/04/17 0332  INR 1.18 1.25   Cardiac Enzymes: No results for input(s): CKTOTAL, CKMB, CKMBINDEX, TROPONINI in the last 168 hours. BNP (last 3 results) No results for input(s): PROBNP in the last 8760 hours. CBG: Recent Labs  Lab 06/06/17 1116 06/06/17 1601 06/06/17 2045 06/07/17 0652 06/07/17 1133  GLUCAP 161* 156* 106* 130* 121*   Studies: No results found.  Scheduled Meds: . aspirin EC  81 mg Oral Daily  . atorvastatin  10 mg Oral q1800  . feeding supplement (GLUCERNA SHAKE)  237 mL Oral TID BM  . feeding supplement (PRO-STAT SUGAR FREE 64)  30 mL Oral BID  . gabapentin  600 mg Oral TID  . heparin injection (subcutaneous)  5,000 Units Subcutaneous Q8H  . insulin aspart  0-15 Units Subcutaneous TID WC  . ipratropium-albuterol  3 mL Nebulization TID  . multivitamin with minerals  1  tablet Oral Daily  . nicotine  14 mg Transdermal Daily  . pantoprazole  40 mg Oral Daily  . polyethylene glycol  17 g Oral BID  . senna-docusate  2 tablet Oral BID  . sodium chloride flush  3 mL Intravenous Q12H   Continuous Infusions: . sodium chloride     PRN Meds: sodium chloride, acetaminophen **OR** acetaminophen, albuterol, morphine injection, ondansetron **OR** ondansetron (ZOFRAN) IV, oxyCODONE, sodium chloride flush  Time spent: 35 minutes  Author: Berle Mull, MD [facility] Pager: [phone] 06/07/2017 4:47 PM  If 7PM-7AM, please contact night-coverage at [email or web address], password [facility]

## 2017-06-07 NOTE — Progress Notes (Addendum)
  Progress Note    06/07/2017 7:23 AM 3 Days Post-Op  Subjective:  No complaints; says he didn't walk much yesterday.  His foot is pressed against the foot rest of the bed.  He can't really tell because he can't feel his foot.  Pt's leg not elevated.  Tm 100.9 (yesterday 0800) now afebrile HR  70's-90's  119'E-174'Y systolic 81% [id number]  Vitals:   06/06/17 2042 06/07/17 0512  BP: 120/72 129/83  Pulse: 80 80  Resp: 18 12  Temp: 99 F (37.2 C) 98.2 F (36.8 C)  SpO2: 94% 92%    Physical Exam: Cardiac:  Regular Lungs:  Non labored Incisions:  All are healing nicely. 3rd right toe amputation site:    Extremities:  + doppler signals right AT/PT/peroneal; swelling RLE   CBC    Component Value Date/Time   WBC 13.4 (H) 06/07/2017 0224   RBC 3.10 (L) 06/07/2017 0224   HGB 9.4 (L) 06/07/2017 0224   HCT 29.4 (L) 06/07/2017 0224   PLT 245 06/07/2017 0224   MCV 94.8 06/07/2017 0224   MCH 30.3 06/07/2017 0224   MCHC 32.0 06/07/2017 0224   RDW 13.1 06/07/2017 0224   LYMPHSABS 1.6 06/06/2017 0134   MONOABS 1.1 (H) 06/06/2017 0134   EOSABS 0.2 06/06/2017 0134   BASOSABS 0.0 06/06/2017 0134    BMET    Component Value Date/Time   NA 134 (L) 06/07/2017 0224   K 4.4 06/07/2017 0224   CL 104 06/07/2017 0224   CO2 23 06/07/2017 0224   GLUCOSE 120 (H) 06/07/2017 0224   BUN 33 (H) 06/07/2017 0224   CREATININE 1.52 (H) 06/07/2017 0224   CALCIUM 8.1 (L) 06/07/2017 0224   GFRNONAA 45 (L) 06/07/2017 0224   GFRAA 53 (L) 06/07/2017 0224    INR    Component Value Date/Time   INR 1.25 06/04/2017 0332     Intake/Output Summary (Last 24 hours) at 06/07/2017 0723 Last data filed at 06/07/2017 0300 Gross per 24 hour  Intake 2178.33 ml  Output 570 ml  Net 1608.33 ml     Assessment:  66 y.o. male is s/p:  R femoral to AK popliteal bypass with GSV conduit and 3rd toe amputation  3 Days Post-Op  Plan: -pt with + doppler signals right AT/PT/peroneal -creatinine continues  to improve.  Dc IVF (could not find an order for this and therefore put in nursing communication to dc IVF) -DVT prophylaxis:  SQ heparin -encourage mobility.  When in bed, he really needs to keep his leg elevated to help with the swelling.  Swelling is not unexpected given harvest of GSV.  Stressed this to the pt.  His calf is non tender.  -Foot also resting on the foot rest.  Stressed to pt he could get a pressure ulcer from this and it could be a problem.  Had RN help me move him up in the bed so that his foot is not wedged on the foot rest.   -   Leontine Locket, PA-C [facility] [phone] 06/07/2017 7:23 AM  Agree with above. Still some pain with ambulation Continue wound care for toe Home when ambulatory  Ruta Hinds, MD [facility] [place]: [phone] Pager: [phone]

## 2017-06-07 NOTE — Clinical Social Work Note (Signed)
CSW acknowledges consult "Access meds for discharge." Please consult RNCM for this.  CSW signing off. Consult again if any social work needs arise.  Reneisha Stilley, CSW [phone]  

## 2017-06-08 ENCOUNTER — Inpatient Hospital Stay (HOSPITAL_COMMUNITY): Payer: Medicare Other

## 2017-06-08 DIAGNOSIS — R0600 Dyspnea, unspecified: Secondary | ICD-10-CM

## 2017-06-08 LAB — CBC
HCT: 29.2 % — ABNORMAL LOW (ref 39.0–52.0)
Hemoglobin: 9.3 g/dL — ABNORMAL LOW (ref 13.0–17.0)
MCH: 30.5 pg (ref 26.0–34.0)
MCHC: 31.8 g/dL (ref 30.0–36.0)
MCV: 95.7 fL (ref 78.0–100.0)
PLATELETS: 304 10*3/uL (ref 150–400)
RBC: 3.05 MIL/uL — ABNORMAL LOW (ref 4.22–5.81)
RDW: 13.2 % (ref 11.5–15.5)
WBC: 15.8 10*3/uL — AB (ref 4.0–10.5)

## 2017-06-08 LAB — GLUCOSE, CAPILLARY
GLUCOSE-CAPILLARY: 128 mg/dL — AB (ref 65–99)
Glucose-Capillary: 167 mg/dL — ABNORMAL HIGH (ref 65–99)
Glucose-Capillary: 130 mg/dL — ABNORMAL HIGH (ref 65–99)

## 2017-06-08 LAB — ECHOCARDIOGRAM COMPLETE
Height: 72 in
Weight: 2832 oz

## 2017-06-08 LAB — BASIC METABOLIC PANEL
Anion gap: 9 (ref 5–15)
BUN: 38 mg/dL — ABNORMAL HIGH (ref 6–20)
CALCIUM: 8.5 mg/dL — AB (ref 8.9–10.3)
CO2: 24 mmol/L (ref 22–32)
CREATININE: 1.49 mg/dL — AB (ref 0.61–1.24)
Chloride: 103 mmol/L (ref 101–111)
GFR, EST AFRICAN AMERICAN: 54 mL/min — AB (ref >60)
GFR, EST NON AFRICAN AMERICAN: 46 mL/min — AB (ref >60)
GLUCOSE: 190 mg/dL — AB (ref 65–99)
Potassium: 4.3 mmol/L (ref 3.5–5.1)
Sodium: 136 mmol/L (ref 135–145)

## 2017-06-08 MED ORDER — COLLAGENASE 250 UNIT/GM EX OINT
TOPICAL_OINTMENT | Freq: Every day | CUTANEOUS | Status: DC
  Administered 2017-06-08 – 2017-06-13 (×6): via TOPICAL
  Filled 2017-06-08 (×3): qty 30

## 2017-06-08 MED ORDER — AMPICILLIN-SULBACTAM SODIUM 1.5 (1-0.5) G IJ SOLR
1.5000 g | Freq: Four times a day (QID) | INTRAMUSCULAR | Status: DC
  Administered 2017-06-08 – 2017-06-12 (×18): 1.5 g via INTRAVENOUS
  Filled 2017-06-08 (×19): qty 1.5

## 2017-06-08 MED ORDER — FUROSEMIDE 10 MG/ML IJ SOLN
20.0000 mg | Freq: Once | INTRAMUSCULAR | Status: AC
  Administered 2017-06-08: 20 mg via INTRAVENOUS
  Filled 2017-06-08: qty 2

## 2017-06-08 MED ORDER — MAGNESIUM CITRATE PO SOLN
1.0000 | Freq: Once | ORAL | Status: AC
  Administered 2017-06-08: 1 via ORAL
  Filled 2017-06-08: qty 296

## 2017-06-08 NOTE — Progress Notes (Signed)
[facility] [place]  Subjective  - feels ok   Objective 127/77 80 99.2 F (37.3 C) (Oral) 18 96%  Intake/Output Summary (Last 24 hours) at 06/08/2017 6720 Last data filed at 06/08/2017 0500 Gross per 24 hour  Intake 440 ml  Output 1275 ml  Net -835 ml   Right foot toe amp site fibrinous exudate, edema about the same Popliteal pulse brisk biphasic PT DP peroneal Incisions all clean and healing  Assessment/Planning: Still not walking much Ambulate today Change wound care to Santyl Leukocytosis foot wound clean but not healing some tenderness in foot, productive cough Will check chest xray to rule out pneumonia resume unasyn for now If no other source may need further debridement of foot  Adrian Bean 06/08/2017 7:12 AM --  Laboratory Lab Results: Recent Labs    06/07/17 0224 06/08/17 0232  WBC 13.4* 15.8*  HGB 9.4* 9.3*  HCT 29.4* 29.2*  PLT 245 304   BMET Recent Labs    06/07/17 0224 06/08/17 0232  NA 134* 136  K 4.4 4.3  CL 104 103  CO2 23 24  GLUCOSE 120* 190*  BUN 33* 38*  CREATININE 1.52* 1.49*  CALCIUM 8.1* 8.5*    COAG Lab Results  Component Value Date   INR 1.25 06/04/2017   INR 1.18 06/03/2017   No results found for: PTT

## 2017-06-08 NOTE — Progress Notes (Signed)
  Echocardiogram 2D Echocardiogram has been performed.  Adrian Bean 06/08/2017, 3:09 PM

## 2017-06-08 NOTE — Progress Notes (Signed)
[facility] Progress Note  Patient: Adrian Bean QIH:[id number]   PCP: [name], MD DOB: Dec 28, 1950   DOA: 06/02/2017   DOS: 06/08/2017   Date of Service: the patient was seen and examined on 06/08/2017  Subjective: Minimal movement.  Still has shortness of breath.  No nausea no vomiting.  Brief hospital course: Pt. with PMH of type II DM, neuropathy, PAD, left foot amputation, tobacco abuse; admitted on 06/02/2017, presented with complaint of right leg wound on third toe, was found to have dry gangrene.  Initially admitted at [facility] for cellulitis and gangrene and started on IV antibiotics.  CTA showed extensive bilateral PVD and patient was transferred to [facility] after discussion with vascular surgery.  Underwent bilateral angiogram and now scheduled for aortofemoral popliteal bypass. Currently further plan is follow-up on postoperative course.  Assessment and Plan: 1.  Right third toe gangrene.   S/P amputation of the right third toe.  06/04/2017 Right femoral to popliteal bypass.  06/04/2017 Peripheral vascular disease. Sepsis secondary to cellulitis. 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  Presented with sepsis physiology in the outside facility currently resolved. Started on IV antibiotics, continues to have leukocytosis.  Now getting better MRSA PCR is negative. Initially on IV vancomycin and Zosyn.  Switch to Unasyn and stopped on 06/06/2017  2.  Type 2 diabetes mellitus. Hemoglobin A1c 6.3. Diabetic neuropathy. Continue sliding scale insulin. Continue gabapentin. Holding home oral hypoglycemic agents.  3.  Active smoker. Discussed importance of quitting. Continue nicotine patch.  4.  Hepatic flexure of the colon thickening. Incidentally noted on the CT scan. Will require outpatient GI follow-up with colonoscopy.  5.  Acute hypoxic respiratory failure. On 3L of oxygen. Does not use oxygen at his  baseline. Clear to auscultation.   Vascular congestion noted on chest x-ray.   IV Lasix 20 mg x1. Wean to room air.  Incentive spirometry. Ambulate and oob to chair  6.  Fever on 06/03/2017. Postoperative fever most likely atelectasis versus effective anesthesia. Already on antibiotic. chest x-ray shows no pneumonia, but has worsening right pleural effusion.  If hypoxia does not better, will get thoracentesis.   7.  Acute kidney injury. Hypoalbuminemia with third spacing. Serum creatinine elevated.  Now better after diuresis On IV fluids.  Despite which the creatinine has worsened. Will give Lasix x1. Monitor.  Diet: Carb modified diet DVT Prophylaxis: subcutaneous Heparin  Advance goals of care discussion: full code  Family Communication: no family was present at bedside, at the time of interview.   Disposition:  Discharge to home with home health once creatinine better, no fever for 24 hours and able to walk.  Consultants: vascular surgery Procedures: Bilateral angiogram.  Femoral-popliteal bypass  Antibiotics: Anti-infectives (From admission, onward)   Start     Dose/Rate Route Frequency Ordered Stop   06/08/17 0830  ampicillin-sulbactam (UNASYN) 1.5 g in sodium chloride 0.9 % 50 mL IVPB     1.5 g 100 mL/hr over 30 Minutes Intravenous Every 6 hours 06/08/17 0716     06/05/17 1130  ampicillin-sulbactam (UNASYN) 1.5 g in sodium chloride 0.9 % 50 mL IVPB  Status:  Discontinued     1.5 g 100 mL/hr over 30 Minutes Intravenous Every 6 hours 06/05/17 0841 06/06/17 1308   06/03/17 0800  ceFAZolin (ANCEF) IVPB 1 g/50 mL premix     1 g 100 mL/hr over 30 Minutes Intravenous On call 06/02/17 1704 06/04/17 0800   06/03/17 0600  vancomycin (VANCOCIN) IVPB 1000 mg/200 mL premix  Status:  Discontinued     1,000 mg 200 mL/hr over 60 Minutes Intravenous Every 12 hours 06/02/17 1917 06/05/17 0756   06/02/17 2000  piperacillin-tazobactam (ZOSYN) IVPB 3.375 g  Status:  Discontinued      3.375 g 12.5 mL/hr over 240 Minutes Intravenous Every 8 hours 06/02/17 1912 06/05/17 0757       Objective: Physical Exam: Vitals:   06/08/17 0419 06/08/17 0700 06/08/17 0800 06/08/17 0820  BP: 127/77   119/71  Pulse:      Resp: 18 17 (!) 23   Temp: 99.2 F (37.3 C)     TempSrc: Oral     SpO2: 96% 98% 96% 96%  Weight:      Height:        Intake/Output Summary (Last 24 hours) at 06/08/2017 1345 Last data filed at 06/08/2017 1300 Gross per 24 hour  Intake 440 ml  Output 1225 ml  Net -785 ml   Filed Weights   06/02/17 1830 06/04/17 1008  Weight: 80.3 kg (177 lb) 80.3 kg (177 lb)   General: Alert, Awake and Oriented to Time, Place and Person. Appear in mild distress, affect appropriate ENT: Oral Mucosa clear moist. Neck: NO JVD, NO Abnormal Mass Or lumps Cardiovascular: S1 and S2 Present, NO Murmur, Peripheral Pulses Present Respiratory: normal respiratory effort, Bilateral Air entry equal and Decreased, no use of accessory muscle, Clear to Auscultation, no Crackles, no wheezes Abdomen: Bowel Sound present, Soft and no tenderness, no hernia Skin: no redness, no Rash, no induration Extremities: no Pedal edema, no calf tenderness Neurologic: Grossly no focal neuro deficit. Bilaterally Equal motor strength  Data Reviewed: CBC: Recent Labs  Lab 06/04/17 0332 06/05/17 0301 06/06/17 0134 06/07/17 0224 06/08/17 0232  WBC 16.0* 13.9* 13.9* 13.4* 15.8*  NEUTROABS  --   --  11.0*  --   --   HGB 10.4* 9.5* 9.4* 9.4* 9.3*  HCT 32.1* 28.7* 29.2* 29.4* 29.2*  MCV 93.3 94.1 95.4 94.8 95.7  PLT 216 195 222 245 076   Basic Metabolic Panel: Recent Labs  Lab 06/05/17 0301 06/06/17 0134 06/06/17 0833 06/07/17 0224 06/08/17 0232  NA 133* 133* 132* 134* 136  K 4.2 4.3 4.8 4.4 4.3  CL 103 102 103 104 103  CO2 23 24 19* 23 24  GLUCOSE 94 158* 123* 120* 190*  BUN 23* 28* 30* 33* 38*  CREATININE 1.76* 1.81* 1.65* 1.52* 1.49*  CALCIUM 7.7* 8.2* 8.4* 8.1* 8.5*  MG  --   --   --   1.8  --     Liver Function Tests: Recent Labs  Lab 06/06/17 0134  AST 23  ALT 15*  ALKPHOS 109  BILITOT 0.6  PROT 5.8*  ALBUMIN 1.8*   No results for input(s): LIPASE, AMYLASE in the last 168 hours. No results for input(s): AMMONIA in the last 168 hours. Coagulation Profile: Recent Labs  Lab 06/03/17 0422 06/04/17 0332  INR 1.18 1.25   Cardiac Enzymes: No results for input(s): CKTOTAL, CKMB, CKMBINDEX, TROPONINI in the last 168 hours. BNP (last 3 results) No results for input(s): PROBNP in the last 8760 hours. CBG: Recent Labs  Lab 06/07/17 1133 06/07/17 1655 06/07/17 2028 06/08/17 0632 06/08/17 1139  GLUCAP 121* 144* 157* 167* 130*   Studies: Dg Chest Port 1 View  Result Date: 06/08/2017 CLINICAL DATA:  Cough, history peripheral arterial disease, smoker, type II diabetes mellitus with diabetic neuropathy EXAM: PORTABLE CHEST 1 VIEW COMPARISON:  Portable exam 1012 hours compared to 06/05/2017 FINDINGS: Upper normal heart size with mild pulmonary vascular congestion. Atherosclerotic calcification aorta. Mediastinal contours normal. Increased RIGHT pleural effusion and basilar atelectasis versus consolidation. Persistent interstitial prominence question mild pulmonary edema. No pneumothorax. Bones demineralized with evidence of prior cervical spine fusion. IMPRESSION: Pulmonary vascular congestion with perihilar interstitial infiltrates question pulmonary edema. Increased RIGHT pleural effusion and atelectasis versus consolidation of the lower RIGHT lung. Electronically Signed   By: Lavonia Dana M.D.   On: 06/08/2017 10:28    Scheduled Meds: . aspirin EC  81 mg Oral Daily  . atorvastatin  10 mg Oral q1800  . collagenase   Topical Daily  . feeding supplement (GLUCERNA SHAKE)  237 mL Oral TID BM  . feeding supplement (PRO-STAT SUGAR FREE 64)  30 mL Oral BID  . gabapentin  600 mg Oral TID  . heparin injection (subcutaneous)  5,000 Units Subcutaneous Q8H  . insulin aspart   0-15 Units Subcutaneous TID WC  . ipratropium-albuterol  3 mL Nebulization TID  . multivitamin with minerals  1 tablet Oral Daily  . nicotine  14 mg Transdermal Daily  . pantoprazole  40 mg Oral Daily  . polyethylene glycol  17 g Oral BID  . senna-docusate  2 tablet Oral BID  . sodium chloride flush  3 mL Intravenous Q12H   Continuous Infusions: . sodium chloride    . ampicillin-sulbactam (UNASYN) IV Stopped (06/08/17 1000)   PRN Meds: sodium chloride, acetaminophen **OR** acetaminophen, albuterol, morphine injection, ondansetron **OR** ondansetron (ZOFRAN) IV, oxyCODONE, sodium chloride flush  Time spent: 35 minutes  Author: Berle Mull, MD [facility] Pager: [phone] 06/08/2017 1:45 PM  If 7PM-7AM, please contact night-coverage at [email or web address], password [facility]

## 2017-06-09 LAB — GLUCOSE, CAPILLARY
GLUCOSE-CAPILLARY: 129 mg/dL — AB (ref 65–99)
GLUCOSE-CAPILLARY: 160 mg/dL — AB (ref 65–99)
Glucose-Capillary: 146 mg/dL — ABNORMAL HIGH (ref 65–99)
Glucose-Capillary: 155 mg/dL — ABNORMAL HIGH (ref 65–99)
Glucose-Capillary: 167 mg/dL — ABNORMAL HIGH (ref 65–99)

## 2017-06-09 LAB — BASIC METABOLIC PANEL
Anion gap: 8 (ref 5–15)
BUN: 36 mg/dL — ABNORMAL HIGH (ref 6–20)
CHLORIDE: 101 mmol/L (ref 101–111)
CO2: 30 mmol/L (ref 22–32)
CREATININE: 1.29 mg/dL — AB (ref 0.61–1.24)
Calcium: 8.9 mg/dL (ref 8.9–10.3)
GFR calc non Af Amer: 55 mL/min — ABNORMAL LOW (ref >60)
Glucose, Bld: 147 mg/dL — ABNORMAL HIGH (ref 65–99)
POTASSIUM: 4.5 mmol/L (ref 3.5–5.1)
SODIUM: 139 mmol/L (ref 135–145)

## 2017-06-09 LAB — CBC
HCT: 31 % — ABNORMAL LOW (ref 39.0–52.0)
HEMOGLOBIN: 9.9 g/dL — AB (ref 13.0–17.0)
MCH: 30.3 pg (ref 26.0–34.0)
MCHC: 31.9 g/dL (ref 30.0–36.0)
MCV: 94.8 fL (ref 78.0–100.0)
Platelets: 365 10*3/uL (ref 150–400)
RBC: 3.27 MIL/uL — AB (ref 4.22–5.81)
RDW: 12.9 % (ref 11.5–15.5)
WBC: 12.9 10*3/uL — ABNORMAL HIGH (ref 4.0–10.5)

## 2017-06-09 NOTE — Progress Notes (Addendum)
  Progress Note    06/09/2017 6:51 AM 5 Days Post-Op  Subjective:  Sitting up-says he had a busy morning cleaning up.   Tm 99.1 503'T-465'K systolic HR 81'E-751'Z NSR 96% 2LO2NC  Vitals:   06/09/17 0400 06/09/17 0630  BP: 134/79   Pulse: 87   Resp: 14 17  Temp:    SpO2: 94%     Physical Exam: Lungs:  Non labored Incisions:  Healing nicely Extremities:  Right foot is warm-bandage just changed on right foot.   CBC    Component Value Date/Time   WBC 12.9 (H) 06/09/2017 0230   RBC 3.27 (L) 06/09/2017 0230   HGB 9.9 (L) 06/09/2017 0230   HCT 31.0 (L) 06/09/2017 0230   PLT 365 06/09/2017 0230   MCV 94.8 06/09/2017 0230   MCH 30.3 06/09/2017 0230   MCHC 31.9 06/09/2017 0230   RDW 12.9 06/09/2017 0230   LYMPHSABS 1.6 06/06/2017 0134   MONOABS 1.1 (H) 06/06/2017 0134   EOSABS 0.2 06/06/2017 0134   BASOSABS 0.0 06/06/2017 0134    BMET    Component Value Date/Time   NA 139 06/09/2017 0230   K 4.5 06/09/2017 0230   CL 101 06/09/2017 0230   CO2 30 06/09/2017 0230   GLUCOSE 147 (H) 06/09/2017 0230   BUN 36 (H) 06/09/2017 0230   CREATININE 1.29 (H) 06/09/2017 0230   CALCIUM 8.9 06/09/2017 0230   GFRNONAA 55 (L) 06/09/2017 0230   GFRAA >60 06/09/2017 0230    INR    Component Value Date/Time   INR 1.25 06/04/2017 0332     Intake/Output Summary (Last 24 hours) at 06/09/2017 0651 Last data filed at 06/09/2017 0600 Gross per 24 hour  Intake 690 ml  Output 1975 ml  Net -1285 ml   PCXR 06/08/17: IMPRESSION: Pulmonary vascular congestion with perihilar interstitial infiltrates question pulmonary edema.  Increased RIGHT pleural effusion and atelectasis versus consolidation of the lower RIGHT lung.   Assessment:  66 y.o. male is s/p:  R femoral to AK popliteal bypass with GSV conduit and 3rd toe amputation  5 Days Post-Op  Plan: -pt doing well this am -still with RLE leg swelling, which is not unexpected due to vein harvest.  Continue leg elevation.    -leukocytosis improved this am; CXR reveals increased right pleural effusion & pulmonary edema-received gentle dose of lasix yesterday; will check toe wound amp site tomorrow as the dressing was just changed.  He has been afebrile x 24hrs -creatinine continues to improve -DVT prophylaxis:  SQ heparin   Leontine Locket, PA-C [facility] [phone] 06/09/2017 6:51 AM

## 2017-06-09 NOTE — Progress Notes (Signed)
Physical Therapy Treatment Patient Details Name: Adrian Bean MRN: [id number] DOB: 06-14-1951 Today's Date: 06/09/2017    History of Present Illness Pt is a 66 y.o. male s/p R femoral to AK popliteal bypass graft and R 3rd toe amputation. PMH: HTN, DM, PVD with L toe amputations, renal disease, current smoker.   PT Comments    Pt progressing well with mobility. Amb with RW and supervision for safety. C/o dizziness with initial standing (seated BP 133/87, standing BP 142/90). SpO2 down to 86% on RA, returning to >90% on 1L O2 [place]. HR 112-133. Will continue to follow acutely to maximize functional mobility and independence prior to d/c home.   Follow Up Recommendations  Home health PT;Supervision - Intermittent     Equipment Recommendations  None recommended by PT    Recommendations for Other Services       Precautions / Restrictions Precautions Precautions: Fall Restrictions Weight Bearing Restrictions: No    Mobility  Bed Mobility               General bed mobility comments: Received sittin gin chair  Transfers Overall transfer level: Needs assistance Equipment used: Rolling walker (2 wheeled) Transfers: Sit to/from Stand Sit to Stand: Min guard;Supervision         General transfer comment: Initial min guard for balance as pt c/o dizziness in standing, progressing to supervision  Ambulation/Gait Ambulation/Gait assistance: Supervision Ambulation Distance (Feet): 150 Feet Assistive device: Rolling walker (2 wheeled) Gait Pattern/deviations: Step-through pattern;Decreased stride length;Decreased weight shift to right;Antalgic Gait velocity: Decreased Gait velocity interpretation: <1.8 ft/sec, indicative of risk for recurrent falls General Gait Details: Supervision for safety   Stairs            Wheelchair Mobility    Modified Rankin (Stroke Patients Only)       Balance Overall balance assessment: Needs assistance   Sitting balance-Leahy  Scale: Good       Standing balance-Leahy Scale: Fair Standing balance comment: Able to static stand to void at toilet with no UE support                            Cognition Arousal/Alertness: Awake/alert Behavior During Therapy: Flat affect Overall Cognitive Status: Within Functional Limits for tasks assessed Area of Impairment: Safety/judgement                         Safety/Judgement: Decreased awareness of safety            Exercises      General Comments General comments (skin integrity, edema, etc.): SpO2 down to 86% on RA, returned to 93% on 1L O2 [place]      Pertinent Vitals/Pain Pain Assessment: Faces Faces Pain Scale: Hurts a little bit Pain Location: RLE Pain Descriptors / Indicators: Sore Pain Intervention(s): Monitored during session;Patient requesting pain meds-RN notified    Home Living                      Prior Function            PT Goals (current goals can now be found in the care plan section) Acute Rehab PT Goals Patient Stated Goal: Go home PT Goal Formulation: With patient Time For Goal Achievement: 06/19/17 Potential to Achieve Goals: Good Progress towards PT goals: Progressing toward goals    Frequency    Min 3X/week      PT Plan Current plan remains  appropriate    Co-evaluation              AM-PAC PT "6 Clicks" Daily Activity  Outcome Measure  Difficulty turning over in bed (including adjusting bedclothes, sheets and blankets)?: A Little Difficulty moving from lying on back to sitting on the side of the bed? : A Little Difficulty sitting down on and standing up from a chair with arms (e.g., wheelchair, bedside commode, etc,.)?: A Little Help needed moving to and from a bed to chair (including a wheelchair)?: A Little Help needed walking in hospital room?: A Little Help needed climbing 3-5 steps with a railing? : A Little 6 Click Score: 18    End of Session Equipment Utilized During  Treatment: Gait belt;Oxygen Activity Tolerance: Patient tolerated treatment well Patient left: in chair;with call bell/phone within reach Nurse Communication: Mobility status PT Visit Diagnosis: Other abnormalities of gait and mobility (R26.89);Pain Pain - Right/Left: Right Pain - part of body: Leg     Time: 1030-1056 PT Time Calculation (min) (ACUTE ONLY): 26 min  Charges:  $Gait Training: 23-37 mins                    G Codes:      Mabeline Caras, PT, DPT Acute Rehab Services  Pager: [place] 06/09/2017, 11:08 AM

## 2017-06-09 NOTE — Progress Notes (Signed)
[facility] Progress Note  Patient: Adrian Bean AST:[id number]   PCP: [name], MD DOB: 04/25/1951   DOA: 06/02/2017   DOS: 06/09/2017   Date of Service: the patient was seen and examined on 06/09/2017  Subjective: Complains about having swelling in the right leg as well as pain.  No nausea no vomiting.  No chest pain.  Oxygenation has improved.  Brief hospital course: Pt. with PMH of type II DM, neuropathy, PAD, left foot amputation, tobacco abuse; admitted on 06/02/2017, presented with complaint of right leg wound on third toe, was found to have dry gangrene.  Initially admitted at [facility] for cellulitis and gangrene and started on IV antibiotics.  CTA showed extensive bilateral PVD and patient was transferred to [facility] after discussion with vascular surgery.  Underwent bilateral angiogram and now scheduled for aortofemoral popliteal bypass. Currently further plan is follow-up on postoperative course.  Assessment and Plan: 1.  Right third toe gangrene.   S/P amputation of the right third toe.  06/04/2017 Right femoral to popliteal bypass.  06/04/2017 Peripheral vascular disease. Sepsis secondary to cellulitis. 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  Presented with sepsis physiology in the outside facility currently resolved. Started on IV antibiotics, continues to have leukocytosis.  Now getting better MRSA PCR is negative. Initially on IV vancomycin and Zosyn.  Switch to Unasyn and stopped on 06/06/2017. Due to leukocytosis vascular surgery resumed antibiotics.  Will monitor recommendation.  2.  Type 2 diabetes mellitus. Hemoglobin A1c 6.3. Diabetic neuropathy. Continue sliding scale insulin. Continue gabapentin. Holding home oral hypoglycemic agents.  3.  Active smoker. Discussed importance of quitting. Continue nicotine patch.  4.  Hepatic flexure of the colon thickening. Incidentally noted on the CT  scan. Will require outpatient GI follow-up with colonoscopy.  5.  Acute hypoxic respiratory failure. On 3L of oxygen. Does not use oxygen at his baseline. Clear to auscultation.   Vascular congestion noted on chest x-ray.   Responded to IV Lasix.  Almost on room air.  Monitor.  Continue incentive spirometry.  6.  Fever on 06/03/2017. Postoperative fever most likely atelectasis versus effective anesthesia. Already on antibiotic.  7.  Acute kidney injury. Hypoalbuminemia with third spacing. Serum creatinine elevated.  Now better after diuresis Monitor.  Diet: Carb modified diet DVT Prophylaxis: subcutaneous Heparin  Advance goals of care discussion: full code  Family Communication: no family was present at bedside, at the time of interview.   Disposition:  Discharge to home with home health pending vascular surgery recommendation.  Consultants: vascular surgery Procedures: Bilateral angiogram.  Femoral-popliteal bypass  Antibiotics: Anti-infectives (From admission, onward)   Start     Dose/Rate Route Frequency Ordered Stop   06/08/17 0830  ampicillin-sulbactam (UNASYN) 1.5 g in sodium chloride 0.9 % 50 mL IVPB     1.5 g 100 mL/hr over 30 Minutes Intravenous Every 6 hours 06/08/17 0716     06/05/17 1130  ampicillin-sulbactam (UNASYN) 1.5 g in sodium chloride 0.9 % 50 mL IVPB  Status:  Discontinued     1.5 g 100 mL/hr over 30 Minutes Intravenous Every 6 hours 06/05/17 0841 06/06/17 1308   06/03/17 0800  ceFAZolin (ANCEF) IVPB 1 g/50 mL premix     1 g 100 mL/hr over 30 Minutes Intravenous On call 06/02/17 1704 06/04/17 0800   06/03/17 0600  vancomycin (VANCOCIN) IVPB 1000 mg/200 mL premix  Status:  Discontinued     1,000 mg 200 mL/hr over 60  Minutes Intravenous Every 12 hours 06/02/17 1917 06/05/17 0756   06/02/17 2000  piperacillin-tazobactam (ZOSYN) IVPB 3.375 g  Status:  Discontinued     3.375 g 12.5 mL/hr over 240 Minutes Intravenous Every 8 hours 06/02/17 1912 06/05/17  0757       Objective: Physical Exam: Vitals:   06/09/17 0757 06/09/17 0831 06/09/17 1231 06/09/17 1351  BP: 127/80  123/79   Pulse: 92  100   Resp:   18   Temp: 98.3 F (36.8 C)  98.9 F (37.2 C)   TempSrc: Oral  Oral   SpO2: 93% 95% 90% 94%  Weight:      Height:        Intake/Output Summary (Last 24 hours) at 06/09/2017 1656 Last data filed at 06/09/2017 1235 Gross per 24 hour  Intake 170 ml  Output 1725 ml  Net -1555 ml   Filed Weights   06/02/17 1830 06/04/17 1008  Weight: 80.3 kg (177 lb) 80.3 kg (177 lb)   General: Alert, Awake and Oriented to Time, Place and Person. Appear in mild distress, affect appropriate ENT: Oral Mucosa clear moist. Neck: NO JVD, NO Abnormal Mass Or lumps Cardiovascular: S1 and S2 Present, NO Murmur, Peripheral Pulses Present Respiratory: normal respiratory effort, Bilateral Air entry equal and Decreased, no use of accessory muscle, Clear to Auscultation, no Crackles, no wheezes Abdomen: Bowel Sound present, Soft and no tenderness, no hernia Skin: no redness, no Rash, no induration Extremities: no Pedal edema, no calf tenderness Neurologic: Grossly no focal neuro deficit. Bilaterally Equal motor strength  Data Reviewed: CBC: Recent Labs  Lab 06/05/17 0301 06/06/17 0134 06/07/17 0224 06/08/17 0232 06/09/17 0230  WBC 13.9* 13.9* 13.4* 15.8* 12.9*  NEUTROABS  --  11.0*  --   --   --   HGB 9.5* 9.4* 9.4* 9.3* 9.9*  HCT 28.7* 29.2* 29.4* 29.2* 31.0*  MCV 94.1 95.4 94.8 95.7 94.8  PLT 195 222 245 304 638   Basic Metabolic Panel: Recent Labs  Lab 06/06/17 0134 06/06/17 0833 06/07/17 0224 06/08/17 0232 06/09/17 0230  NA 133* 132* 134* 136 139  K 4.3 4.8 4.4 4.3 4.5  CL 102 103 104 103 101  CO2 24 19* 23 24 30   GLUCOSE 158* 123* 120* 190* 147*  BUN 28* 30* 33* 38* 36*  CREATININE 1.81* 1.65* 1.52* 1.49* 1.29*  CALCIUM 8.2* 8.4* 8.1* 8.5* 8.9  MG  --   --  1.8  --   --     Liver Function Tests: Recent Labs  Lab  06/06/17 0134  AST 23  ALT 15*  ALKPHOS 109  BILITOT 0.6  PROT 5.8*  ALBUMIN 1.8*   No results for input(s): LIPASE, AMYLASE in the last 168 hours. No results for input(s): AMMONIA in the last 168 hours. Coagulation Profile: Recent Labs  Lab 06/03/17 0422 06/04/17 0332  INR 1.18 1.25   Cardiac Enzymes: No results for input(s): CKTOTAL, CKMB, CKMBINDEX, TROPONINI in the last 168 hours. BNP (last 3 results) No results for input(s): PROBNP in the last 8760 hours. CBG: Recent Labs  Lab 06/08/17 1604 06/08/17 2132 06/09/17 0631 06/09/17 1144 06/09/17 1645  GLUCAP 146* 128* 155* 129* 167*   Studies: No results found.  Scheduled Meds: . aspirin EC  81 mg Oral Daily  . atorvastatin  10 mg Oral q1800  . collagenase   Topical Daily  . feeding supplement (GLUCERNA SHAKE)  237 mL Oral TID BM  . feeding supplement (PRO-STAT SUGAR FREE 64)  30  mL Oral BID  . gabapentin  600 mg Oral TID  . heparin injection (subcutaneous)  5,000 Units Subcutaneous Q8H  . insulin aspart  0-15 Units Subcutaneous TID WC  . ipratropium-albuterol  3 mL Nebulization TID  . multivitamin with minerals  1 tablet Oral Daily  . nicotine  14 mg Transdermal Daily  . pantoprazole  40 mg Oral Daily  . polyethylene glycol  17 g Oral BID  . senna-docusate  2 tablet Oral BID  . sodium chloride flush  3 mL Intravenous Q12H   Continuous Infusions: . sodium chloride    . ampicillin-sulbactam (UNASYN) IV Stopped (06/09/17 1407)   PRN Meds: sodium chloride, acetaminophen **OR** acetaminophen, albuterol, morphine injection, ondansetron **OR** ondansetron (ZOFRAN) IV, oxyCODONE, sodium chloride flush  Time spent: 35 minutes  Author: Berle Mull, MD [facility] Pager: [phone] 06/09/2017 4:56 PM  If 7PM-7AM, please contact night-coverage at [email or web address], password [facility]

## 2017-06-10 LAB — GLUCOSE, CAPILLARY
GLUCOSE-CAPILLARY: 190 mg/dL — AB (ref 65–99)
GLUCOSE-CAPILLARY: 150 mg/dL — AB (ref 65–99)
Glucose-Capillary: 178 mg/dL — ABNORMAL HIGH (ref 65–99)
Glucose-Capillary: 164 mg/dL — ABNORMAL HIGH (ref 65–99)

## 2017-06-10 LAB — BASIC METABOLIC PANEL
ANION GAP: 8 (ref 5–15)
BUN: 31 mg/dL — AB (ref 6–20)
CALCIUM: 9 mg/dL (ref 8.9–10.3)
CO2: 29 mmol/L (ref 22–32)
Chloride: 101 mmol/L (ref 101–111)
Creatinine, Ser: 1.31 mg/dL — ABNORMAL HIGH (ref 0.61–1.24)
GFR calc Af Amer: >60 mL/min (ref >60)
GFR calc non Af Amer: 54 mL/min — ABNORMAL LOW (ref >60)
GLUCOSE: 191 mg/dL — AB (ref 65–99)
POTASSIUM: 4.3 mmol/L (ref 3.5–5.1)
Sodium: 138 mmol/L (ref 135–145)

## 2017-06-10 LAB — CBC
HCT: 30.3 % — ABNORMAL LOW (ref 39.0–52.0)
Hemoglobin: 9.6 g/dL — ABNORMAL LOW (ref 13.0–17.0)
MCH: 30.8 pg (ref 26.0–34.0)
MCHC: 31.7 g/dL (ref 30.0–36.0)
MCV: 97.1 fL (ref 78.0–100.0)
Platelets: 386 10*3/uL (ref 150–400)
RBC: 3.12 MIL/uL — ABNORMAL LOW (ref 4.22–5.81)
RDW: 13.5 % (ref 11.5–15.5)
WBC: 12.9 10*3/uL — AB (ref 4.0–10.5)

## 2017-06-10 NOTE — Care Management Important Message (Signed)
Important Message  Patient Details  Name: Adrian Bean MRN: [id number] Date of Birth: 08-11-1950   Medicare Important Message Given:  Yes    Kennidy Lamke [address] 06/10/2017, 3:23 PM

## 2017-06-10 NOTE — Progress Notes (Addendum)
Physical Therapy Treatment Patient Details Name: Adrian Bean MRN: [id number] DOB: 1950/11/05 Today's Date: 06/10/2017    History of Present Illness Pt is a 66 y.o. male s/p R femoral to AK popliteal bypass graft and R 3rd toe amputation. PMH: HTN, DM, PVD with L toe amputations, renal disease, current smoker.   PT Comments    Pt progressing well. Mod indep to amb 200' with RW; refused stair training, stating "I'll figure it out when I get there... I'll make it work." Still feel HHPT is an appropriate recommendation for patient due to decreased activity tolerance and c/o BLE fatigue. Pt states his brother will be able to assist him immediately at d/c. Pt states he is ready to leave the hospital asap to take care of matters at home. Discussed importance of activity pacing, fall risk reduction and continued mobility; education completed and questions answered. From a mobility perspective, feel pt is safe to d/c with HHPT and initial support of brother. Encouraged to continue ambulating with nursing. Will d/c acute PT.   Follow Up Recommendations  Home health PT;Supervision - Intermittent     Equipment Recommendations  None recommended by PT    Recommendations for Other Services       Precautions / Restrictions Precautions Precautions: Fall Restrictions Weight Bearing Restrictions: No    Mobility  Bed Mobility Overal bed mobility: Independent                Transfers Overall transfer level: Modified independent Equipment used: Rolling walker (2 wheeled) Transfers: Sit to/from Stand Sit to Stand: Modified independent (Device/Increase time)            Ambulation/Gait Ambulation/Gait assistance: Modified independent (Device/Increase time) Ambulation Distance (Feet): 200 Feet Assistive device: Rolling walker (2 wheeled)   Gait velocity: Decreased Gait velocity interpretation: <1.8 ft/sec, indicative of risk for recurrent falls General Gait Details: Only assist  for SpO2 needs. Pt c/o BLE fatigue; educ on importance of activity pacing and rest breaks as needed.    Stairs Stairs: (Pt refusing stair training)          Wheelchair Mobility    Modified Rankin (Stroke Patients Only)       Balance Overall balance assessment: Needs assistance   Sitting balance-Leahy Scale: Good       Standing balance-Leahy Scale: Fair Standing balance comment: Able to static stand to void at toilet with no UE support                            Cognition Arousal/Alertness: Awake/alert Behavior During Therapy: WFL for tasks assessed/performed Overall Cognitive Status: Within Functional Limits for tasks assessed                                 General Comments: When asked to try stair training pt states "I'm not doing that... I'll figure it out". Does not seem as much related to decreased safety awareness as personality traits      Exercises      General Comments General comments (skin integrity, edema, etc.): SpO2 down to 81% on RA, >90% on 2L O2 [place]      Pertinent Vitals/Pain Pain Assessment: Faces Faces Pain Scale: Hurts a little bit Pain Location: RLE Pain Descriptors / Indicators: Sore Pain Intervention(s): Monitored during session    Home Living  Prior Function            PT Goals (current goals can now be found in the care plan section) Progress towards PT goals: Goals met/education completed, patient discharged from PT    Frequency    Min 3X/week      PT Plan Current plan remains appropriate    Co-evaluation              AM-PAC PT "6 Clicks" Daily Activity  Outcome Measure  Difficulty turning over in bed (including adjusting bedclothes, sheets and blankets)?: None Difficulty moving from lying on back to sitting on the side of the bed? : None Difficulty sitting down on and standing up from a chair with arms (e.g., wheelchair, bedside commode, etc,.)?:  None Help needed moving to and from a bed to chair (including a wheelchair)?: None Help needed walking in hospital room?: None Help needed climbing 3-5 steps with a railing? : A Little 6 Click Score: 23    End of Session Equipment Utilized During Treatment: Gait belt;Oxygen Activity Tolerance: Patient tolerated treatment well Patient left: in chair;with call bell/phone within reach Nurse Communication: Mobility status PT Visit Diagnosis: Other abnormalities of gait and mobility (R26.89);Pain Pain - Right/Left: Right Pain - part of body: Leg     Time: 0923-0950 PT Time Calculation (min) (ACUTE ONLY): 27 min  Charges:  $Gait Training: 8-22 mins $Self Care/Home Management: 8-22                    G Codes:      Mabeline Caras, PT, DPT Acute Rehab Services  Pager: [place] 06/10/2017, 10:03 AM

## 2017-06-10 NOTE — Progress Notes (Addendum)
[facility]  Subjective  - doing well   Objective 118/70 78 (!) 97.3 F (36.3 C) (Oral) 11 92%  Intake/Output Summary (Last 24 hours) at 06/10/2017 0804 Last data filed at 06/10/2017 0654 Gross per 24 hour  Intake 850 ml  Output 1030 ml  Net -180 ml    Right third toe dark edges plantar surface, no drainage wet to dry dressing. Doppler DP/PT/Peroneal   Assessment/Planning: 65 y.o. male is s/p:  R femoral to AK popliteal bypass with GSV conduit and 3rd toe amputation  6 Days Post-Op DVT prophylaxis:  SQ heparin  Antibiotics re started by Dr. Oneida Alar leukocytosis 12.5     Roxy Horseman 06/10/2017 8:04 AM --  Laboratory Lab Results: Recent Labs    06/09/17 0230 06/10/17 0443  WBC 12.9* 12.9*  HGB 9.9* 9.6*  HCT 31.0* 30.3*  PLT 365 386   BMET Recent Labs    06/09/17 0230 06/10/17 0443  NA 139 138  K 4.5 4.3  CL 101 101  CO2 30 29  GLUCOSE 147* 191*  BUN 36* 31*  CREATININE 1.29* 1.31*  CALCIUM 8.9 9.0    COAG Lab Results  Component Value Date   INR 1.25 06/04/2017   INR 1.18 06/03/2017   No results found for: PTT  I have examined the patient, reviewed and agree with above.  All right leg incisions healing.  Strong popliteal pulse well-perfused foot and mild swelling.  Continue packing to open toe amputation.  Does have some erythema in the arch of his foot.  Will continue to observe.  Discussed with the patient he may have some extension in the tendon sheath.  Remains afebrile with mildly elevated white blood cell count  Curt Jews, MD 06/10/2017 1:10 PM

## 2017-06-10 NOTE — Progress Notes (Signed)
OT Cancellation Note  Patient Details Name: Adrian Bean MRN: [id number] DOB: 1950-11-09   Cancelled Treatment:    Reason Eval/Treat Not Completed: Patient declined, no reason specified. Pt reporting that he will not participate in OT this evening because he is eating. Will check back in the morning to progress with plan of care.   Norman Herrlich, MS OTR/L  Pager: [place] [facility] Angely Dietz 06/10/2017, 5:59 PM

## 2017-06-10 NOTE — Progress Notes (Signed)
SATURATION QUALIFICATIONS: (This note is used to comply with regulatory documentation for home oxygen)  Patient Saturations on Room Air at Rest = 86%  Patient Saturations on Room Air while Ambulating = 81%  Patient Saturations on 2 Liters of oxygen while Ambulating = 91%  Please briefly explain why patient needs home oxygen: Pt desaturates to 81% when ambulating on room air, returning to >90% on 2L O2 [place].

## 2017-06-10 NOTE — Progress Notes (Signed)
[facility] Progress Note  Patient: Adrian Bean ZOX:[id number]   PCP: [name], MD DOB: 1951-02-14   DOA: 06/02/2017   DOS: 06/10/2017   Date of Service: the patient was seen and examined on 06/10/2017  Subjective: Denies any acute complaint.  Needed more oxygen last night.  No nausea no vomiting.  Pain is well controlled.  Brief hospital course: Pt. with PMH of type II DM, neuropathy, PAD, left foot amputation, tobacco abuse; admitted on 06/02/2017, presented with complaint of right leg wound on third toe, was found to have dry gangrene.  Initially admitted at [facility] for cellulitis and gangrene and started on IV antibiotics.  CTA showed extensive bilateral PVD and patient was transferred to [facility] after discussion with vascular surgery.  Underwent bilateral angiogram and now scheduled for aortofemoral popliteal bypass. Currently further plan is follow-up on postoperative course.  Assessment and Plan: 1.  Right third toe gangrene.   S/P amputation of the right third toe.  06/04/2017 Right femoral to popliteal bypass.  06/04/2017 Peripheral vascular disease. Sepsis secondary to cellulitis. 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  Presented with sepsis physiology in the outside facility currently resolved. Started on IV antibiotics, continues to have leukocytosis.  Now getting better MRSA PCR is negative. Initially on IV vancomycin and Zosyn.  Switch to Unasyn and stopped on 06/06/2017. Due to leukocytosis vascular surgery resumed antibiotics.  Will monitor recommendation. Based on some increasing erythema there may be some extension in the tendon sheath per vascular surgery.  Continue dressing changes as recommended  2.  Type 2 diabetes mellitus. Hemoglobin A1c 6.3. Diabetic neuropathy. Continue sliding scale insulin. Continue gabapentin. Holding home oral hypoglycemic agents.  3.  Active smoker. Discussed  importance of quitting. Continue nicotine patch.  4.  Hepatic flexure of the colon thickening. Incidentally noted on the CT scan. Will require outpatient GI follow-up with colonoscopy.  5.  Acute hypoxic respiratory failure. On 3L of oxygen. Does not use oxygen at his baseline. Clear to auscultation.   Vascular congestion noted on chest x-ray.   Responded to IV Lasix.  Holding off diuresis for now Continue incentive spirometry.  6.  Fever on 06/03/2017. Postoperative fever most likely atelectasis versus effective anesthesia. Already on antibiotic.  7.  Acute kidney injury. Hypoalbuminemia with third spacing. Serum creatinine elevated.  Now better after diuresis with albumin Monitor.  Diet: Carb modified diet DVT Prophylaxis: subcutaneous Heparin  Advance goals of care discussion: full code  Family Communication: no family was present at bedside, at the time of interview.   Disposition:  Discharge to home with home health pending vascular surgery recommendation.  Consultants: vascular surgery Procedures: Bilateral angiogram.  Femoral-popliteal bypass  Antibiotics: Anti-infectives (From admission, onward)   Start     Dose/Rate Route Frequency Ordered Stop   06/08/17 0830  ampicillin-sulbactam (UNASYN) 1.5 g in sodium chloride 0.9 % 50 mL IVPB     1.5 g 100 mL/hr over 30 Minutes Intravenous Every 6 hours 06/08/17 0716     06/05/17 1130  ampicillin-sulbactam (UNASYN) 1.5 g in sodium chloride 0.9 % 50 mL IVPB  Status:  Discontinued     1.5 g 100 mL/hr over 30 Minutes Intravenous Every 6 hours 06/05/17 0841 06/06/17 1308   06/03/17 0800  ceFAZolin (ANCEF) IVPB 1 g/50 mL premix     1 g 100 mL/hr over 30 Minutes Intravenous On call 06/02/17 1704 06/04/17 0800   06/03/17 0600  vancomycin (VANCOCIN) IVPB  1000 mg/200 mL premix  Status:  Discontinued     1,000 mg 200 mL/hr over 60 Minutes Intravenous Every 12 hours 06/02/17 1917 06/05/17 0756   06/02/17 2000   piperacillin-tazobactam (ZOSYN) IVPB 3.375 g  Status:  Discontinued     3.375 g 12.5 mL/hr over 240 Minutes Intravenous Every 8 hours 06/02/17 1912 06/05/17 0757       Objective: Physical Exam: Vitals:   06/10/17 0512 06/10/17 0800 06/10/17 0854 06/10/17 1435  BP: 118/70 136/81    Pulse: 78 94    Resp: 11 16    Temp: (!) 97.3 F (36.3 C) (!) 97.5 F (36.4 C)    TempSrc: Oral Axillary    SpO2: 92% 93% 94% 95%  Weight:      Height:        Intake/Output Summary (Last 24 hours) at 06/10/2017 1623 Last data filed at 06/10/2017 1307 Gross per 24 hour  Intake 850 ml  Output 1080 ml  Net -230 ml   Filed Weights   06/02/17 1830 06/04/17 1008  Weight: 80.3 kg (177 lb) 80.3 kg (177 lb)   General: Alert, Awake and Oriented to Time, Place and Person. Appear in mild distress, affect appropriate ENT: Oral Mucosa clear moist. Neck: NO JVD, NO Abnormal Mass Or lumps Cardiovascular: S1 and S2 Present, NO Murmur, Peripheral Pulses Present Respiratory: normal respiratory effort, Bilateral Air entry equal and Decreased, no use of accessory muscle, Clear to Auscultation, no Crackles, no wheezes Abdomen: Bowel Sound present, Soft and no tenderness, no hernia Skin: no redness, no Rash, no induration Extremities: no Pedal edema, no calf tenderness Neurologic: Grossly no focal neuro deficit. Bilaterally Equal motor strength  Data Reviewed: CBC: Recent Labs  Lab 06/06/17 0134 06/07/17 0224 06/08/17 0232 06/09/17 0230 06/10/17 0443  WBC 13.9* 13.4* 15.8* 12.9* 12.9*  NEUTROABS 11.0*  --   --   --   --   HGB 9.4* 9.4* 9.3* 9.9* 9.6*  HCT 29.2* 29.4* 29.2* 31.0* 30.3*  MCV 95.4 94.8 95.7 94.8 97.1  PLT 222 245 304 365 867   Basic Metabolic Panel: Recent Labs  Lab 06/06/17 0833 06/07/17 0224 06/08/17 0232 06/09/17 0230 06/10/17 0443  NA 132* 134* 136 139 138  K 4.8 4.4 4.3 4.5 4.3  CL 103 104 103 101 101  CO2 19* 23 24 30 29   GLUCOSE 123* 120* 190* 147* 191*  BUN 30* 33* 38*  36* 31*  CREATININE 1.65* 1.52* 1.49* 1.29* 1.31*  CALCIUM 8.4* 8.1* 8.5* 8.9 9.0  MG  --  1.8  --   --   --     Liver Function Tests: Recent Labs  Lab 06/06/17 0134  AST 23  ALT 15*  ALKPHOS 109  BILITOT 0.6  PROT 5.8*  ALBUMIN 1.8*   No results for input(s): LIPASE, AMYLASE in the last 168 hours. No results for input(s): AMMONIA in the last 168 hours. Coagulation Profile: Recent Labs  Lab 06/04/17 0332  INR 1.25   Cardiac Enzymes: No results for input(s): CKTOTAL, CKMB, CKMBINDEX, TROPONINI in the last 168 hours. BNP (last 3 results) No results for input(s): PROBNP in the last 8760 hours. CBG: Recent Labs  Lab 06/09/17 1144 06/09/17 1645 06/09/17 2105 06/10/17 0634 06/10/17 1135  GLUCAP 129* 167* 160* 178* 164*   Studies: No results found.  Scheduled Meds: . aspirin EC  81 mg Oral Daily  . atorvastatin  10 mg Oral q1800  . collagenase   Topical Daily  . feeding supplement (GLUCERNA  SHAKE)  237 mL Oral TID BM  . feeding supplement (PRO-STAT SUGAR FREE 64)  30 mL Oral BID  . gabapentin  600 mg Oral TID  . heparin injection (subcutaneous)  5,000 Units Subcutaneous Q8H  . insulin aspart  0-15 Units Subcutaneous TID WC  . ipratropium-albuterol  3 mL Nebulization TID  . multivitamin with minerals  1 tablet Oral Daily  . nicotine  14 mg Transdermal Daily  . pantoprazole  40 mg Oral Daily  . polyethylene glycol  17 g Oral BID  . senna-docusate  2 tablet Oral BID  . sodium chloride flush  3 mL Intravenous Q12H   Continuous Infusions: . sodium chloride    . ampicillin-sulbactam (UNASYN) IV Stopped (06/10/17 0915)   PRN Meds: sodium chloride, acetaminophen **OR** acetaminophen, albuterol, morphine injection, ondansetron **OR** ondansetron (ZOFRAN) IV, oxyCODONE, sodium chloride flush  Time spent: 35 minutes  Author: Berle Mull, MD [facility] Pager: [phone] 06/10/2017 4:23 PM  If 7PM-7AM, please contact night-coverage at [email or web address],  password [facility]

## 2017-06-11 ENCOUNTER — Inpatient Hospital Stay (HOSPITAL_COMMUNITY): Payer: Medicare Other

## 2017-06-11 DIAGNOSIS — Z720 Tobacco use: Secondary | ICD-10-CM

## 2017-06-11 DIAGNOSIS — I739 Peripheral vascular disease, unspecified: Secondary | ICD-10-CM

## 2017-06-11 DIAGNOSIS — J9601 Acute respiratory failure with hypoxia: Secondary | ICD-10-CM

## 2017-06-11 LAB — DIFFERENTIAL
BASOS ABS: 0 10*3/uL (ref 0.0–0.1)
BASOS PCT: 0 %
EOS ABS: 0.3 10*3/uL (ref 0.0–0.7)
Eosinophils Relative: 2 %
Lymphocytes Relative: 17 %
Lymphs Abs: 2.3 10*3/uL (ref 0.7–4.0)
MONO ABS: 1 10*3/uL (ref 0.1–1.0)
MONOS PCT: 7 %
Neutro Abs: 10 10*3/uL — ABNORMAL HIGH (ref 1.7–7.7)
Neutrophils Relative %: 74 %

## 2017-06-11 LAB — CBC
HEMATOCRIT: 30.1 % — AB (ref 39.0–52.0)
HEMOGLOBIN: 9.5 g/dL — AB (ref 13.0–17.0)
MCH: 30.3 pg (ref 26.0–34.0)
MCHC: 31.6 g/dL (ref 30.0–36.0)
MCV: 95.9 fL (ref 78.0–100.0)
PLATELETS: 415 10*3/uL — AB (ref 150–400)
RBC: 3.14 MIL/uL — AB (ref 4.22–5.81)
RDW: 13.2 % (ref 11.5–15.5)
WBC: 14.2 10*3/uL — AB (ref 4.0–10.5)

## 2017-06-11 LAB — BASIC METABOLIC PANEL
ANION GAP: 6 (ref 5–15)
BUN: 27 mg/dL — AB (ref 6–20)
CALCIUM: 8.7 mg/dL — AB (ref 8.9–10.3)
CO2: 30 mmol/L (ref 22–32)
Chloride: 102 mmol/L (ref 101–111)
Creatinine, Ser: 1.17 mg/dL (ref 0.61–1.24)
GFR calc Af Amer: >60 mL/min (ref >60)
GLUCOSE: 146 mg/dL — AB (ref 65–99)
POTASSIUM: 4.4 mmol/L (ref 3.5–5.1)
SODIUM: 138 mmol/L (ref 135–145)

## 2017-06-11 LAB — GLUCOSE, CAPILLARY
GLUCOSE-CAPILLARY: 178 mg/dL — AB (ref 65–99)
GLUCOSE-CAPILLARY: 138 mg/dL — AB (ref 65–99)
GLUCOSE-CAPILLARY: 161 mg/dL — AB (ref 65–99)
Glucose-Capillary: 148 mg/dL — ABNORMAL HIGH (ref 65–99)

## 2017-06-11 MED ORDER — GADOBENATE DIMEGLUMINE 529 MG/ML IV SOLN
17.0000 mL | Freq: Once | INTRAVENOUS | Status: AC | PRN
  Administered 2017-06-11: 17 mL via INTRAVENOUS

## 2017-06-11 NOTE — Progress Notes (Addendum)
[facility]  Subjective  - doing OK no new complaints.   Objective 135/71 90 99 F (37.2 C) (Oral) 17 94%  Intake/Output Summary (Last 24 hours) at 06/11/2017 0263 Last data filed at 06/11/2017 7858 Gross per 24 hour  Intake 700 ml  Output 1250 ml  Net -550 ml    Right LE Doppler AT signal , venous flow PT/peroneal Right plantar foot with erythema extending to the arch No drainage from the amputation site Heart RRR Lungs non labored breathing, productive cough    Assessment/Planning: 66 y.o.maleis s/p:  R femoral to AK popliteal bypass with GSV conduit and 3rd toe amputation 6 Days Post-Op DVT prophylaxis:SQ heparin  Leukocytosis now 14.2 We may order an MRI of the right foot today will discuss with Dr. Armstead Peaks 06/11/2017 7:18 AM --  Laboratory Lab Results: Recent Labs    06/10/17 0443 06/11/17 0252  WBC 12.9* 14.2*  HGB 9.6* 9.5*  HCT 30.3* 30.1*  PLT 386 415*   BMET Recent Labs    06/10/17 0443 06/11/17 0252  NA 138 138  K 4.3 4.4  CL 101 102  CO2 29 30  GLUCOSE 191* 146*  BUN 31* 27*  CREATININE 1.31* 1.17  CALCIUM 9.0 8.7*    COAG Lab Results  Component Value Date   INR 1.25 06/04/2017   INR 1.18 06/03/2017   No results found for: PTT  I have examined the patient, reviewed and agree with above.  Right foot well perfused.  Continued with some mild tenderness in the arch of his foot below the right third toe amputation.  Concerned about possible extension of infection in the tendon sheath.  Will obtain MRI of his foot today to determine if further surgical debridement will be required.  Would continue IV antibiotics until this issue is resolved.  Curt Jews, MD 06/11/2017 8:22 AM

## 2017-06-11 NOTE — Progress Notes (Signed)
PROGRESS NOTE [facility]   Adrian Bean   VZD:[id number] DOB: Jul 10, 1950  DOA: 06/02/2017 PCP: [name], MD   Brief Narrative:  Adrian Bean is a 66 y/o M with with PMH of type II DM, neuropathy, PAD, left foot amputation, tobacco abuse; admitted on 06/02/2017, presented with complaint of right leg wound on third toe, was found to have dry gangrene.  Initially admitted at [facility] for cellulitis and gangrene and started on IV antibiotics. CTA showed extensive bilateral PVD and patient was transferred to [facility] after discussion with vascular surgery.  Underwent bilateral angiogram and underwent for aortofemoral popliteal bypass and R 3rd toe amputation. Patient developed atelectasis and was placed on O2 supplementation. Follow up post op, patient with increase in WBC and R foot erythema. MRI does not show osteomyelitis    Subjective: Patient seen and examined, have no complaints. Remains afebrile. No acute events overnight   Assessment & Plan: 1. Right third toe gangrene.   S/P amputation of the right third toe.  06/04/2017 Right femoral to popliteal bypass.  06/04/2017 Peripheral vascular disease. Sepsis secondary to cellulitis. 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 Presented with sepsis physiology in the outside facility currently resolved. MRSA PCR is negative. Initially on IV vancomycin and Zosyn.  Switch to Unasyn and stopped on 06/06/2017. Due to leukocytosis vascular surgery resumed antibiotics. MRI of the R foot does not shows osteo or abscess, changes consistent with post op changes. Unclear sources of leuko - maybe reactive. Check cbc with diff in AM.     2. Type 2 diabetes mellitus. Hemoglobin A1c 6.3. Diabetic neuropathy. Holding home oral hypoglycemics Continue current regimen   3. Tobacco abuse  Smoking cessation discussed   4.  Hepatic flexure of the colon thickening. Incidentally noted  on the CT scan. Will require outpatient GI follow-up with colonoscopy.  5.  Acute hypoxic respiratory failure. - vascular congestion vs atelectasis  Wean O2 as tolerated to RA, patient does not use O2 at baseline. Vascular congestion noted on chest x-ray. Will repeat CXR in AM  Responded to IV Lasix.  Holding off diuresis for now Continue incentive spirometry.  6.  Fever - Resolved  Postoperative fever most likely atelectasis versus effective anesthesia. Already on antibiotic.  7.  Acute kidney injury. - improved  Patient was given lasix due to hypoalbuminemia  Continue to monitor for now   DVT prophylaxis: Heparin sq Code Status: Full  Family Communication: Non at bedside  Disposition Plan: Home in the next 24-48 hrs, pending vascular surgery recommendations    Consultants:   Vascular surgery   Procedures:  S/P amputation of the right third toe.  06/04/2017 Right femoral to popliteal bypass.  06/04/2017  Antimicrobials: Anti-infectives (From admission, onward)   Start     Dose/Rate Route Frequency Ordered Stop   06/08/17 0830  ampicillin-sulbactam (UNASYN) 1.5 g in sodium chloride 0.9 % 50 mL IVPB     1.5 g 100 mL/hr over 30 Minutes Intravenous Every 6 hours 06/08/17 0716     06/05/17 1130  ampicillin-sulbactam (UNASYN) 1.5 g in sodium chloride 0.9 % 50 mL IVPB  Status:  Discontinued     1.5 g 100 mL/hr over 30 Minutes Intravenous Every 6 hours 06/05/17 0841 06/06/17 1308   06/03/17 0800  ceFAZolin (ANCEF) IVPB 1 g/50 mL premix     1 g 100 mL/hr over 30 Minutes Intravenous On call 06/02/17 1704 06/04/17 0800   06/03/17 0600  vancomycin (VANCOCIN) IVPB 1000 mg/200 mL premix  Status:  Discontinued     1,000 mg 200 mL/hr over 60 Minutes Intravenous Every 12 hours 06/02/17 1917 06/05/17 0756   06/02/17 2000  piperacillin-tazobactam (ZOSYN) IVPB 3.375 g  Status:  Discontinued     3.375 g 12.5 mL/hr over 240 Minutes Intravenous Every 8 hours 06/02/17 1912 06/05/17 0757            Objective: Vitals:   06/10/17 1932 06/10/17 2059 06/11/17 0507 06/11/17 1354  BP: 126/83  135/71   Pulse: 94  90   Resp: (!) 23  17   Temp: 99.8 F (37.7 C)  99 F (37.2 C)   TempSrc: Oral  Oral   SpO2: 94% 96% 94% 97%  Weight:      Height:        Intake/Output Summary (Last 24 hours) at 06/11/2017 1526 Last data filed at 06/11/2017 0998 Gross per 24 hour  Intake 580 ml  Output 350 ml  Net 230 ml   Filed Weights   06/02/17 1830 06/04/17 1008  Weight: 80.3 kg (177 lb) 80.3 kg (177 lb)    Examination:  General exam: Appears calm and comfortable  HEENT: AC/AT, PERRLA, OP moist and clear Respiratory system: Clear to auscultation. No wheezes,crackle or rhonchi Cardiovascular system: S1 & S2 heard, RRR. No JVD, murmurs, rubs or gallops Gastrointestinal system: Abdomen is nondistended, soft and nontender.  Central nervous system: Alert and oriented. No focal neurological deficits. Extremities: R plantar foot erythema, mild tenderness. No drainage noted from amputation site  Skin: No rashes, lesions or ulcers Psychiatry:  Mood & affect appropriate.    Data Reviewed: I have personally reviewed following labs and imaging studies  CBC: Recent Labs  Lab 06/06/17 0134 06/07/17 0224 06/08/17 0232 06/09/17 0230 06/10/17 0443 06/11/17 0252  WBC 13.9* 13.4* 15.8* 12.9* 12.9* 14.2*  NEUTROABS 11.0*  --   --   --   --  10.0*  HGB 9.4* 9.4* 9.3* 9.9* 9.6* 9.5*  HCT 29.2* 29.4* 29.2* 31.0* 30.3* 30.1*  MCV 95.4 94.8 95.7 94.8 97.1 95.9  PLT 222 245 304 365 386 338*   Basic Metabolic Panel: Recent Labs  Lab 06/07/17 0224 06/08/17 0232 06/09/17 0230 06/10/17 0443 06/11/17 0252  NA 134* 136 139 138 138  K 4.4 4.3 4.5 4.3 4.4  CL 104 103 101 101 102  CO2 23 24 30 29 30   GLUCOSE 120* 190* 147* 191* 146*  BUN 33* 38* 36* 31* 27*  CREATININE 1.52* 1.49* 1.29* 1.31* 1.17  CALCIUM 8.1* 8.5* 8.9 9.0 8.7*  MG 1.8  --   --   --   --    GFR: Estimated  Creatinine Clearance: 66.3 mL/min (by C-G formula based on SCr of 1.17 mg/dL). Liver Function Tests: Recent Labs  Lab 06/06/17 0134  AST 23  ALT 15*  ALKPHOS 109  BILITOT 0.6  PROT 5.8*  ALBUMIN 1.8*   No results for input(s): LIPASE, AMYLASE in the last 168 hours. No results for input(s): AMMONIA in the last 168 hours. Coagulation Profile: No results for input(s): INR, PROTIME in the last 168 hours. Cardiac Enzymes: No results for input(s): CKTOTAL, CKMB, CKMBINDEX, TROPONINI in the last 168 hours. BNP (last 3 results) No results for input(s): PROBNP in the last 8760 hours. HbA1C: No results for input(s): HGBA1C in the last 72 hours. CBG: Recent Labs  Lab 06/10/17 1135 06/10/17 1648 06/10/17 2030 06/11/17 0626 06/11/17 1122  GLUCAP 164* 190* 150* 178*  138*   Lipid Profile: No results for input(s): CHOL, HDL, LDLCALC, TRIG, CHOLHDL, LDLDIRECT in the last 72 hours. Thyroid Function Tests: No results for input(s): TSH, T4TOTAL, FREET4, T3FREE, THYROIDAB in the last 72 hours. Anemia Panel: No results for input(s): VITAMINB12, FOLATE, FERRITIN, TIBC, IRON, RETICCTPCT in the last 72 hours. Sepsis Labs: No results for input(s): PROCALCITON, LATICACIDVEN in the last 168 hours.  Recent Results (from the past 240 hour(s))  Surgical pcr screen     Status: None   Collection Time: 06/02/17  8:08 PM  Result Value Ref Range Status   MRSA, PCR NEGATIVE NEGATIVE Final   Staphylococcus aureus NEGATIVE NEGATIVE Final    Comment: (NOTE) The Xpert SA Assay (FDA approved for NASAL specimens in patients 64 years of age and older), is one component of a comprehensive surveillance program. It is not intended to diagnose infection nor to guide or monitor treatment.       Radiology Studies: Mr Foot Right W Wo Contrast  Result Date: 06/11/2017 CLINICAL DATA:  Dry gangrene of the right third toe. EXAM: MRI OF THE RIGHT FOREFOOT WITHOUT AND WITH CONTRAST TECHNIQUE: Multiplanar,  multisequence MR imaging of the right forefoot was performed before and after the administration of intravenous contrast. CONTRAST:  35mL MULTIHANCE GADOBENATE DIMEGLUMINE 529 MG/ML IV SOLN COMPARISON:  None. FINDINGS: Bones/Joint/Cartilage Third toe and portion of the third metatarsal head. No adjacent marrow edema of the third metatarsal to suggest osteomyelitis. Reactive marrow edema is noted of the second third toes without cortical bone destruction. No acute fracture or malalignment. Ligaments Intact given limitations of soft tissue edema. Muscles and Tendons The extensor and flexor tendons to the remaining toes are intact and of normal signal intensity and morphology. Soft tissues There is an nonenhancing postop subcutaneous fluid collection measuring 4.8 x 1.7 x 1.3 cm along the plantar aspect of the distal second and third metatarsals tracking along the lateral aspect of the flexor digitorum longus and brevis tendons. IMPRESSION: 1. Status post amputation of the third toe and portion of the third metatarsal head without marrow signal abnormalities to indicate osteomyelitis of the third metatarsal. 2. There is mild reactive edema of the adjacent second and fourth toes without cortical bone destruction, fracture or joint dislocations. 3. Small nonenhancing plantar fluid collection of the forefoot measuring 4.8 x 1.7 x 1.3 cm likely postop in etiology. Electronically Signed   By: Ashley Royalty M.D.   On: 06/11/2017 12:47      Scheduled Meds: . aspirin EC  81 mg Oral Daily  . atorvastatin  10 mg Oral q1800  . collagenase   Topical Daily  . feeding supplement (GLUCERNA SHAKE)  237 mL Oral TID BM  . feeding supplement (PRO-STAT SUGAR FREE 64)  30 mL Oral BID  . gabapentin  600 mg Oral TID  . heparin injection (subcutaneous)  5,000 Units Subcutaneous Q8H  . insulin aspart  0-15 Units Subcutaneous TID WC  . ipratropium-albuterol  3 mL Nebulization TID  . multivitamin with minerals  1 tablet Oral Daily    . nicotine  14 mg Transdermal Daily  . pantoprazole  40 mg Oral Daily  . polyethylene glycol  17 g Oral BID  . senna-docusate  2 tablet Oral BID  . sodium chloride flush  3 mL Intravenous Q12H   Continuous Infusions: . sodium chloride    . ampicillin-sulbactam (UNASYN) IV Stopped (06/11/17 1506)     LOS: 9 days    Time spent: Total of 25 minutes spent  with pt, greater than 50% of which was spent in discussion of  treatment, counseling and coordination of care    Chipper Oman, MD Pager: Text Page via [email or web address]   If 7PM-7AM, please contact night-coverage [email or web address] 06/11/2017, 3:26 PM

## 2017-06-11 NOTE — Progress Notes (Signed)
Occupational Therapy Treatment Patient Details Name: Adrian Bean MRN: [id number] DOB: January 03, 1951 Today's Date: 06/11/2017    History of present illness Pt is a 66 y.o. male s/p R femoral to AK popliteal bypass graft and R 3rd toe amputation. PMH: HTN, DM, PVD with L toe amputations, renal disease, current smoker.   OT comments  Pt demonstrating some progress toward OT goals. He continues to demonstrate instability during functional mobility related to ADL transfers. Pt reports that he will not wear his supplemental oxygen at home despite OT education concerning its importance at this time per walking O2 saturation taken from PT. Pt currently requires overall min guard assist for LB ADL as well as toilet transfers. Continue to recommend home health OT follow-up post-acute D/C; however, pt may not be agreeable.    Follow Up Recommendations  Home health OT    Equipment Recommendations  (pt refusing any equipment)    Recommendations for Other Services      Precautions / Restrictions Precautions Precautions: Fall Restrictions Weight Bearing Restrictions: No       Mobility Bed Mobility               General bed mobility comments: Received sitting in chair.   Transfers Overall transfer level: Needs assistance Equipment used: Rolling walker (2 wheeled) Transfers: Sit to/from Stand Sit to Stand: Supervision         General transfer comment: Supervision for safety with sit<>stand and min guard assist once on his feet.     Balance Overall balance assessment: Needs assistance Sitting-balance support: No upper extremity supported;Feet supported Sitting balance-Leahy Scale: Good     Standing balance support: Bilateral upper extremity supported;No upper extremity supported Standing balance-Leahy Scale: Fair Standing balance comment: Able to statically stand during ADL tasks without UE support.                            ADL either performed or assessed  with clinical judgement   ADL Overall ADL's : Needs assistance/impaired                     Lower Body Dressing: Min guard;Sit to/from stand   Toilet Transfer: Min guard;RW;Ambulation           Functional mobility during ADLs: Min guard;Rolling walker General ADL Comments: Min guard assist for stability during functional mobility.      Vision       Perception     Praxis      Cognition Arousal/Alertness: Awake/alert Behavior During Therapy: WFL for tasks assessed/performed Overall Cognitive Status: Within Functional Limits for tasks assessed Area of Impairment: Safety/judgement                         Safety/Judgement: Decreased awareness of safety     General Comments: Pt reporting "they barely have me on any oxygen in here so I'm never going to use it at home."        Exercises     Shoulder Instructions       General Comments Pt on 2L O2 via [place] throughout session    Pertinent Vitals/ Pain       Pain Assessment: Faces Faces Pain Scale: Hurts little more Pain Location: RLE Pain Descriptors / Indicators: Sore Pain Intervention(s): Monitored during session;Repositioned  Home Living  Prior Functioning/Environment              Frequency  Min 2X/week        Progress Toward Goals  OT Goals(current goals can now be found in the care plan section)  Progress towards OT goals: Progressing toward goals  Acute Rehab OT Goals Patient Stated Goal: Go home OT Goal Formulation: With patient Time For Goal Achievement: 06/19/17 Potential to Achieve Goals: Good  Plan Discharge plan remains appropriate(may need to update dependign on progress)    Co-evaluation                 AM-PAC PT "6 Clicks" Daily Activity     Outcome Measure   Help from another person eating meals?: None Help from another person taking care of personal grooming?: A Little Help from another  person toileting, which includes using toliet, bedpan, or urinal?: A Little Help from another person bathing (including washing, rinsing, drying)?: A Little Help from another person to put on and taking off regular upper body clothing?: A Little Help from another person to put on and taking off regular lower body clothing?: A Little 6 Click Score: 19    End of Session Equipment Utilized During Treatment: Gait belt;Rolling walker  OT Visit Diagnosis: Unsteadiness on feet (R26.81);Other abnormalities of gait and mobility (R26.89);Pain   Activity Tolerance Patient limited by lethargy   Patient Left with call bell/phone within reach(in bathroom and to call nurse tech when finished)   Nurse Communication Mobility status(pt in bathroom )        Time: 1453-1520 OT Time Calculation (min): 27 min  Charges: OT General Charges $OT Visit: 1 Visit OT Treatments $Self Care/Home Management : 23-37 mins  Norman Herrlich, MS OTR/L  Pager: [place] 06/11/2017, 5:03 PM

## 2017-06-11 NOTE — Progress Notes (Signed)
Nutrition Follow Up  DOCUMENTATION CODES:   Non-severe (moderate) malnutrition in context of chronic illness  INTERVENTION:    Continue Glucerna Shake po TID, each supplement provides 220 kcal and 10 grams of protein  NEW NUTRITION DIAGNOSIS:   Moderate Malnutrition related to chronic illness(uncontrolled diabetes) as evidenced by moderate muscle depletion, moderate fat depletion, ongoing  GOAL:   Patient will meet greater than or equal to 90% of their needs, progressing  MONITOR:   PO intake, Supplement acceptance, Labs, Weight trends, Skin  ASSESSMENT:   66 yo Male with PMH of type II DM, neuropathy, PAD, left foot amputation, tobacco abuse; admitted on 06/02/2017, presented with complaint of right leg wound on third toe, was found to have dry gangrene.  Pt s/p procedures 12/5: BYPASS GRAFT FEMORAL-POPLITEAL ARTERY (Right) AMPUTATION 3rd toe DIGIT (Right)  Pt reports he's eating well. PO intake variable at 25-100% per flowsheet records. He states he is drinking his Glucerna Shake supplements. Medications reviewed and include MVI daily. Labs reviewed. CBG's [id number].  NUTRITION - FOCUSED PHYSICAL EXAM:    Most Recent Value  Orbital Region  Mild depletion  Upper Arm Region  Moderate depletion  Thoracic and Lumbar Region  Moderate depletion  Buccal Region  Mild depletion  Temple Region  Moderate depletion  Clavicle Bone Region  Moderate depletion  Clavicle and Acromion Bone Region  Moderate depletion  Scapular Bone Region  Unable to assess  Dorsal Hand  Unable to assess  Patellar Region  Moderate depletion  Anterior Thigh Region  Moderate depletion  Posterior Calf Region  Moderate depletion  Edema (RD Assessment)  None     Diet Order:  Diet Carb Modified Fluid consistency: Thin; Room service appropriate? Yes  EDUCATION NEEDS:   No education needs have been identified at this time  Skin:  Skin Assessment: Skin Integrity Issues: Skin Integrity Issues::  Other (Comment) Other: LE wound  Last BM:  12/9  Height:   Ht Readings from Last 1 Encounters:  06/04/17 6' (1.829 m)   Weight:   Wt Readings from Last 1 Encounters:  06/04/17 177 lb (80.3 kg)   Ideal Body Weight:  81 kg  BMI:  Body mass index is 24.01 kg/m.  Estimated Nutritional Needs:   Kcal:  2200-2400  Protein:  110-125 gm  Fluid:  2.2-2.4 L  Arthur Holms, RD, LDN Pager #: [phone] After-Hours Pager #: [phone]

## 2017-06-12 ENCOUNTER — Inpatient Hospital Stay (HOSPITAL_COMMUNITY): Payer: Medicare Other

## 2017-06-12 DIAGNOSIS — I96 Gangrene, not elsewhere classified: Secondary | ICD-10-CM

## 2017-06-12 LAB — BASIC METABOLIC PANEL
ANION GAP: 7 (ref 5–15)
BUN: 26 mg/dL — ABNORMAL HIGH (ref 6–20)
CO2: 29 mmol/L (ref 22–32)
Calcium: 8.6 mg/dL — ABNORMAL LOW (ref 8.9–10.3)
Chloride: 101 mmol/L (ref 101–111)
Creatinine, Ser: 1.15 mg/dL (ref 0.61–1.24)
GFR calc Af Amer: >60 mL/min (ref >60)
GLUCOSE: 151 mg/dL — AB (ref 65–99)
POTASSIUM: 4.3 mmol/L (ref 3.5–5.1)
Sodium: 137 mmol/L (ref 135–145)

## 2017-06-12 LAB — GLUCOSE, CAPILLARY
Glucose-Capillary: 149 mg/dL — ABNORMAL HIGH (ref 65–99)
Glucose-Capillary: 188 mg/dL — ABNORMAL HIGH (ref 65–99)
Glucose-Capillary: 177 mg/dL — ABNORMAL HIGH (ref 65–99)

## 2017-06-12 LAB — CBC WITH DIFFERENTIAL/PLATELET
BASOS ABS: 0 10*3/uL (ref 0.0–0.1)
Basophils Relative: 0 %
Eosinophils Absolute: 0.3 10*3/uL (ref 0.0–0.7)
Eosinophils Relative: 2 %
HCT: 29.5 % — ABNORMAL LOW (ref 39.0–52.0)
Hemoglobin: 9.2 g/dL — ABNORMAL LOW (ref 13.0–17.0)
LYMPHS PCT: 11 %
Lymphs Abs: 1.5 10*3/uL (ref 0.7–4.0)
MCH: 29.9 pg (ref 26.0–34.0)
MCHC: 31.2 g/dL (ref 30.0–36.0)
MCV: 95.8 fL (ref 78.0–100.0)
Monocytes Absolute: 1 10*3/uL (ref 0.1–1.0)
Monocytes Relative: 7 %
NEUTROS ABS: 11.1 10*3/uL — AB (ref 1.7–7.7)
Neutrophils Relative %: 80 %
PLATELETS: 396 10*3/uL (ref 150–400)
RBC: 3.08 MIL/uL — ABNORMAL LOW (ref 4.22–5.81)
RDW: 13.2 % (ref 11.5–15.5)
WBC: 13.9 10*3/uL — ABNORMAL HIGH (ref 4.0–10.5)

## 2017-06-12 LAB — ALBUMIN: Albumin: 1.8 g/dL — ABNORMAL LOW (ref 3.5–5.0)

## 2017-06-12 MED ORDER — CEFAZOLIN SODIUM-DEXTROSE 2-4 GM/100ML-% IV SOLN
2.0000 g | INTRAVENOUS | Status: AC
  Administered 2017-06-13: 2 g via INTRAVENOUS
  Filled 2017-06-12 (×2): qty 100

## 2017-06-12 MED ORDER — ALPRAZOLAM 0.25 MG PO TABS
0.2500 mg | ORAL_TABLET | Freq: Once | ORAL | Status: AC
  Administered 2017-06-12: 0.25 mg via ORAL
  Filled 2017-06-12: qty 1

## 2017-06-12 MED ORDER — POVIDONE-IODINE 10 % EX SWAB
2.0000 "application " | Freq: Once | CUTANEOUS | Status: AC
  Administered 2017-06-13: 2 via TOPICAL

## 2017-06-12 MED ORDER — DOXYCYCLINE HYCLATE 100 MG PO TABS
100.0000 mg | ORAL_TABLET | Freq: Two times a day (BID) | ORAL | Status: DC
  Administered 2017-06-12 – 2017-06-13 (×3): 100 mg via ORAL
  Filled 2017-06-12 (×3): qty 1

## 2017-06-12 MED ORDER — CHLORHEXIDINE GLUCONATE 4 % EX LIQD
60.0000 mL | Freq: Once | CUTANEOUS | Status: AC
  Administered 2017-06-13: 4 via TOPICAL
  Filled 2017-06-12: qty 60

## 2017-06-12 NOTE — Progress Notes (Signed)
Subjective: Interval History: none.. No new complaints.  Objective: Vital signs in last 24 hours: Temp:  [97.9 F (36.6 C)-98.9 F (37.2 C)] 98.9 F (37.2 C) (12/13 0425) Pulse Rate:  [89-99] 89 (12/12 2047) Resp:  [16-26] 20 (12/13 0425) BP: (129-139)/(80-88) 129/80 (12/13 0425) SpO2:  [90 %-100 %] 90 % (12/13 0425)  Intake/Output from previous day: 12/12 0701 - 12/13 0700 In: 500 [P.O.:400; IV Piggyback:100] Out: 350 [Urine:350] Intake/Output this shift: No intake/output data recorded.  Surgical incisions all healing.  2-3+ popliteal pulse.  Foot well perfused with moderate swelling.  Some erythema in the arch of his foot mild tenderness.  Lab Results: Recent Labs    06/11/17 0252 06/12/17 0310  WBC 14.2* 13.9*  HGB 9.5* 9.2*  HCT 30.1* 29.5*  PLT 415* 396   BMET Recent Labs    06/11/17 0252 06/12/17 0310  NA 138 137  K 4.4 4.3  CL 102 101  CO2 30 29  GLUCOSE 146* 151*  BUN 27* 26*  CREATININE 1.17 1.15  CALCIUM 8.7* 8.6*    Studies/Results: Mr Foot Right W Wo Contrast  Result Date: 06/11/2017 CLINICAL DATA:  Dry gangrene of the right third toe. EXAM: MRI OF THE RIGHT FOREFOOT WITHOUT AND WITH CONTRAST TECHNIQUE: Multiplanar, multisequence MR imaging of the right forefoot was performed before and after the administration of intravenous contrast. CONTRAST:  66mL MULTIHANCE GADOBENATE DIMEGLUMINE 529 MG/ML IV SOLN COMPARISON:  None. FINDINGS: Bones/Joint/Cartilage Third toe and portion of the third metatarsal head. No adjacent marrow edema of the third metatarsal to suggest osteomyelitis. Reactive marrow edema is noted of the second third toes without cortical bone destruction. No acute fracture or malalignment. Ligaments Intact given limitations of soft tissue edema. Muscles and Tendons The extensor and flexor tendons to the remaining toes are intact and of normal signal intensity and morphology. Soft tissues There is an nonenhancing postop subcutaneous fluid  collection measuring 4.8 x 1.7 x 1.3 cm along the plantar aspect of the distal second and third metatarsals tracking along the lateral aspect of the flexor digitorum longus and brevis tendons. IMPRESSION: 1. Status post amputation of the third toe and portion of the third metatarsal head without marrow signal abnormalities to indicate osteomyelitis of the third metatarsal. 2. There is mild reactive edema of the adjacent second and fourth toes without cortical bone destruction, fracture or joint dislocations. 3. Small nonenhancing plantar fluid collection of the forefoot measuring 4.8 x 1.7 x 1.3 cm likely postop in etiology. Electronically Signed   By: Ashley Royalty M.D.   On: 06/11/2017 12:47   Dg Chest Port 1 View  Result Date: 06/08/2017 CLINICAL DATA:  Cough, history peripheral arterial disease, smoker, type II diabetes mellitus with diabetic neuropathy EXAM: PORTABLE CHEST 1 VIEW COMPARISON:  Portable exam 1012 hours compared to 06/05/2017 FINDINGS: Upper normal heart size with mild pulmonary vascular congestion. Atherosclerotic calcification aorta. Mediastinal contours normal. Increased RIGHT pleural effusion and basilar atelectasis versus consolidation. Persistent interstitial prominence question mild pulmonary edema. No pneumothorax. Bones demineralized with evidence of prior cervical spine fusion. IMPRESSION: Pulmonary vascular congestion with perihilar interstitial infiltrates question pulmonary edema. Increased RIGHT pleural effusion and atelectasis versus consolidation of the lower RIGHT lung. Electronically Signed   By: Lavonia Dana M.D.   On: 06/08/2017 10:28   Dg Chest Port 1 View  Result Date: 06/05/2017 CLINICAL DATA:  Small bowel obstruction. History of diabetes, current smoker. EXAM: PORTABLE CHEST 1 VIEW COMPARISON:  None in PACs FINDINGS: The left lung is  well-expanded. On the right there is mild volume loss and apparent mild elevation of the hemidiaphragm. There is patchy alveolar opacity  at the right lung base. The interstitial markings of both lungs are increased. The pulmonary vascularity is mildly engorged. The cardiac silhouette is top-normal in size. There is calcification in the wall of the aortic arch. IMPRESSION: COPD. Right basilar atelectasis or pneumonia. Moderate pulmonary interstitial edema of cardiac or noncardiac cause. Electronically Signed   By: David  Martinique M.D.   On: 06/05/2017 11:16   Anti-infectives: Anti-infectives (From admission, onward)   Start     Dose/Rate Route Frequency Ordered Stop   06/08/17 0830  ampicillin-sulbactam (UNASYN) 1.5 g in sodium chloride 0.9 % 50 mL IVPB     1.5 g 100 mL/hr over 30 Minutes Intravenous Every 6 hours 06/08/17 0716     06/05/17 1130  ampicillin-sulbactam (UNASYN) 1.5 g in sodium chloride 0.9 % 50 mL IVPB  Status:  Discontinued     1.5 g 100 mL/hr over 30 Minutes Intravenous Every 6 hours 06/05/17 0841 06/06/17 1308   06/03/17 0800  ceFAZolin (ANCEF) IVPB 1 g/50 mL premix     1 g 100 mL/hr over 30 Minutes Intravenous On call 06/02/17 1704 06/04/17 0800   06/03/17 0600  vancomycin (VANCOCIN) IVPB 1000 mg/200 mL premix  Status:  Discontinued     1,000 mg 200 mL/hr over 60 Minutes Intravenous Every 12 hours 06/02/17 1917 06/05/17 0756   06/02/17 2000  piperacillin-tazobactam (ZOSYN) IVPB 3.375 g  Status:  Discontinued     3.375 g 12.5 mL/hr over 240 Minutes Intravenous Every 8 hours 06/02/17 1912 06/05/17 0757      Assessment/Plan: s/p Procedure(s): BYPASS  FEMORAL-POPLITEAL ARTERY USING GREATER SAPHENOUS VEIN (Right) AMPUTATION 3rd toeDIGIT (Right) VEIN HARVEST RIGHT SAPHENOUS VEIN (Right) Stable overall.  White count still 13,000 but down slightly from yesterday.  MRI of his foot revealed a small fluid collection at the base of his third metatarsal in the arch of his foot.  Discussed with the patient may need to open this area for drainage.  Will ask Dr. Sharol Given to consult.   LOS: 10 days   Adrian Bean 06/12/2017,  7:16 AM

## 2017-06-12 NOTE — H&P (View-Only) (Signed)
ORTHOPAEDIC CONSULTATION  REQUESTING PHYSICIAN: Patrecia Pour, Christean Grief, MD  Chief Complaint: Ulceration with gangrene right forefoot status post revascularization status post third toe amputation.  HPI: Adrian Bean is a 66 y.o. male who presents with history of diabetes and smoking status post revascularization to the right lower extremity with an excellent dorsalis pedis pulse.  Patient is status post a third ray amputation and is seen in consultation for progressive necrotic changes around the wound as well as exposed second and fourth metatarsals with a fluctuant area on the plantar aspect of his foot.  Past Medical History:  Diagnosis Date  . Diabetic neuropathy ([place])    [name] 06/02/2017  . High cholesterol   . PAD (peripheral artery disease) ([place]) 06/02/2017  . Tobacco abuse 06/02/2017  . Type II diabetes mellitus ([place]) 06/02/2017   Past Surgical History:  Procedure Laterality Date  . ABDOMINAL AORTOGRAM N/A 06/03/2017   Procedure: ABDOMINAL AORTOGRAM;  Surgeon: Conrad Redding, MD;  Location: [place] [facility];  Service: Cardiovascular;  Laterality: N/A;  . AMPUTATION Right 06/04/2017   Procedure: AMPUTATION 3rd toeDIGIT;  Surgeon: Rosetta Posner, MD;  Location: [place] [facility];  Service: Vascular;  Laterality: Right;  . CHOLECYSTECTOMY    . FEMORAL-POPLITEAL BYPASS GRAFT Right 06/04/2017   Procedure: BYPASS  FEMORAL-POPLITEAL ARTERY USING GREATER SAPHENOUS VEIN;  Surgeon: Rosetta Posner, MD;  Location: [place];  Service: Vascular;  Laterality: Right;  . FOOT RAY RESECTION Left 2018   hx fourth and fifth ray amputation/notes 06/02/2017  . LOWER EXTREMITY ANGIOGRAPHY Bilateral 06/03/2017   Procedure: Lower Extremity Angiography;  Surgeon: Conrad [place], MD;  Location: [place] [facility];  Service: Cardiovascular;  Laterality: Bilateral;  . PERIPHERAL VASCULAR CATHETERIZATION Left 12/2016   angioplasty of iliac artery  hx/notes 06/02/2017  . VEIN HARVEST Right 06/04/2017   Procedure: VEIN HARVEST  RIGHT SAPHENOUS VEIN;  Surgeon: Rosetta Posner, MD;  Location: [facility];  Service: Vascular;  Laterality: Right;   Social History   Socioeconomic History  . Marital status: Married    Spouse name: None  . Number of children: None  . Years of education: None  . Highest education level: None  Social Needs  . Financial resource strain: None  . Food insecurity - worry: None  . Food insecurity - inability: None  . Transportation needs - medical: None  . Transportation needs - non-medical: None  Occupational History  . None  Tobacco Use  . Smoking status: Current Every Day Smoker    Packs/day: 0.50    Years: 50.00    Pack years: 25.00    Types: Cigarettes  . Smokeless tobacco: Never Used  Substance and Sexual Activity  . Alcohol use: No    Frequency: Never  . Drug use: No  . Sexual activity: Not Currently  Other Topics Concern  . None  Social History Narrative  . None   History reviewed. No pertinent family history. - negative except otherwise stated in the family history section No Known Allergies Prior to Admission medications   Medication Sig Start Date End Date Taking? Authorizing Provider  atorvastatin (LIPITOR) 10 MG tablet Take 10 mg by mouth daily at 6 PM.   Yes [provider]  gabapentin (NEURONTIN) 300 MG capsule Take 600 mg by mouth 3 (three) times daily.   Yes [provider]  glimepiride (AMARYL) 2 MG tablet Take 2 mg by mouth daily with breakfast.   Yes [provider]  lisinopril (PRINIVIL,ZESTRIL) 10 MG tablet  Take 10 mg by mouth daily.   Yes [provider]  Multiple Vitamin (MULTIVITAMIN WITH MINERALS) TABS tablet Take 1 tablet by mouth daily.   Yes [provider]  omega-3 acid ethyl esters (LOVAZA) 1 g capsule Take 1 g by mouth daily.   Yes [provider]   Mr Foot Right W Wo Contrast  Result Date: 06/11/2017 CLINICAL DATA:  Dry gangrene of the right third toe. EXAM: MRI OF THE RIGHT FOREFOOT WITHOUT  AND WITH CONTRAST TECHNIQUE: Multiplanar, multisequence MR imaging of the right forefoot was performed before and after the administration of intravenous contrast. CONTRAST:  60mL MULTIHANCE GADOBENATE DIMEGLUMINE 529 MG/ML IV SOLN COMPARISON:  None. FINDINGS: Bones/Joint/Cartilage Third toe and portion of the third metatarsal head. No adjacent marrow edema of the third metatarsal to suggest osteomyelitis. Reactive marrow edema is noted of the second third toes without cortical bone destruction. No acute fracture or malalignment. Ligaments Intact given limitations of soft tissue edema. Muscles and Tendons The extensor and flexor tendons to the remaining toes are intact and of normal signal intensity and morphology. Soft tissues There is an nonenhancing postop subcutaneous fluid collection measuring 4.8 x 1.7 x 1.3 cm along the plantar aspect of the distal second and third metatarsals tracking along the lateral aspect of the flexor digitorum longus and brevis tendons. IMPRESSION: 1. Status post amputation of the third toe and portion of the third metatarsal head without marrow signal abnormalities to indicate osteomyelitis of the third metatarsal. 2. There is mild reactive edema of the adjacent second and fourth toes without cortical bone destruction, fracture or joint dislocations. 3. Small nonenhancing plantar fluid collection of the forefoot measuring 4.8 x 1.7 x 1.3 cm likely postop in etiology. Electronically Signed   By: Ashley Royalty M.D.   On: 06/11/2017 12:47   Dg Chest Port 1 View  Result Date: 06/12/2017 CLINICAL DATA:  Shortness of breath. EXAM: PORTABLE CHEST 1 VIEW COMPARISON:  Radiograph of June 08, 2017. FINDINGS: Stable cardiomediastinal silhouette. No pneumothorax is noted. Mild interstitial densities are noted in the left lung base which may represent edema or inflammation. Atherosclerosis of thoracic aorta is noted. Stable moderate right pleural effusion is noted with associated atelectasis  or infiltrate. Bony thorax is unremarkable. IMPRESSION: Aortic atherosclerosis. Stable moderate right pleural effusion with associated atelectasis or infiltrate. Mild interstitial densities are noted in left lung base which may represent edema or inflammation. Electronically Signed   By: Marijo Conception, M.D.   On: 06/12/2017 12:18   - pertinent xrays, CT, MRI studies were reviewed and independently interpreted  Positive ROS: All other systems have been reviewed and were otherwise negative with the exception of those mentioned in the HPI and as above.  Physical Exam: General: Alert, no acute distress Psychiatric: Patient is competent for consent with normal mood and affect Lymphatic: No axillary or cervical lymphadenopathy Cardiovascular: No pedal edema Respiratory: No cyanosis, no use of accessory musculature GI: No organomegaly, abdomen is soft and non-tender  Skin: Examination the medial border of the fourth toe and metatarsal is ischemic and gangrenous as well as the lateral border of the second toe being ischemic and gangrenous with exposed second and fourth metatarsal heads.   Neurologic: Patient does not have protective sensation bilateral lower extremities.   MUSCULOSKELETAL:  Examination patient has an excellent dorsalis pedis pulse status post revascularization to the right lower extremity.  The wound bed status post third toe amputation is necrotic there is exposed third metatarsal there is  exposed second metatarsal and exposed fourth metatarsal.  There is a fluctuant area on the plantar aspect of the foot which could be ischemic tissue is well.  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  Plan: Plan: I feel patient's only option for foot salvage would be a transmetatarsal amputation.  Do not feel that  leaving the great toe would provide him a stable foot for ambulation.  I will plan for surgery tomorrow Friday.  Patient is to be n.p.o. after midnight.  Risks and benefits were discussed with the patient he states he understands and wishes to proceed with surgery.  Thank you for the consult and the opportunity to see Mr. Cathi Roan, MD [facility] [phone] 7:10 PM

## 2017-06-12 NOTE — Progress Notes (Signed)
PROGRESS NOTE [facility]   Adrian Bean   GGY:[id number] DOB: 03-14-1951  DOA: 06/02/2017 PCP: [name], MD   Brief Narrative:  Adrian Bean is a 66 y/o M with with PMH of type II DM, neuropathy, PAD, left foot amputation, tobacco abuse; admitted on 06/02/2017, presented with complaint of right leg wound on third toe, was found to have dry gangrene.  Initially admitted at [facility] for cellulitis and gangrene and started on IV antibiotics. CTA showed extensive bilateral PVD and patient was transferred to [facility] after discussion with vascular surgery.  Underwent bilateral angiogram and underwent for aortofemoral popliteal bypass and R 3rd toe amputation. Patient developed atelectasis and was placed on O2 supplementation. Follow up post op, patient with increase in WBC and R foot erythema. MRI does not show osteomyelitis    Subjective: Patient seen and examined, have no complaints. Remains afebrile. No acute events overnight   Assessment & Plan: 1. Right third toe gangrene.   S/P amputation of the right third toe.  06/04/2017 Right femoral to popliteal bypass.  06/04/2017 Peripheral vascular disease. Sepsis secondary to cellulitis. 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 Presented with sepsis physiology in the outside facility currently resolved. MRSA PCR is negative. Initially on IV vancomycin and Zosyn.  Switch to Unasyn and stopped on 06/06/2017. Due to leukocytosis vascular surgery resumed antibiotics. MRI of the R foot does not shows osteo or abscess, there is fluid collection at the base of his third metatarsal in the arch of the foot, Vascular sx ask orthopedic for evaluation. Unclear sources of leuko - maybe reactive. Continue to monitor cbc   2. Type 2 diabetes mellitus. Hemoglobin A1c 6.3. Diabetic neuropathy. CBG's stable  Holding home oral hypoglycemics Continue current regimen   3. Tobacco abuse    Smoking cessation discussed   4.  Hepatic flexure of the colon thickening. Incidentally noted on the CT scan. Will require outpatient GI follow-up with colonoscopy.  5.  Acute hypoxic respiratory failure. - vascular congestion vs atelectasis  Wean O2 as tolerated to RA, patient does not use O2 at baseline. Vascular congestion noted on chest x-ray 12/9, repeated CXR 12/13 shows stable moderate R pleural effusion with atelectasis vs infiltrates - patient on Zosyn will switch to doxy for more pulmonary cover Initially responded to IV Lasix. then lasix placed on hold, will give another dose of Lasix 40 mg IV  Continue incentive spirometry.   6.  Fever - Resolved  Postoperative fever most likely atelectasis versus effective anesthesia. Already on antibiotic.  7.  Acute kidney injury. - improved  Patient was given lasix due to hypoalbuminemia  Continue to monitor for now   DVT prophylaxis: Heparin sq Code Status: Full  Family Communication: Non at bedside  Disposition Plan: Pending vascular surgery clearance   Consultants:   Vascular surgery   Procedures:  S/P amputation of the right third toe.  06/04/2017 Right femoral to popliteal bypass.  06/04/2017  Antimicrobials: Anti-infectives (From admission, onward)   Start     Dose/Rate Route Frequency Ordered Stop   06/08/17 0830  ampicillin-sulbactam (UNASYN) 1.5 g in sodium chloride 0.9 % 50 mL IVPB     1.5 g 100 mL/hr over 30 Minutes Intravenous Every 6 hours 06/08/17 0716     06/05/17 1130  ampicillin-sulbactam (UNASYN) 1.5 g in sodium chloride 0.9 % 50 mL IVPB  Status:  Discontinued     1.5 g 100 mL/hr over 30 Minutes Intravenous Every  6 hours 06/05/17 0841 06/06/17 1308   06/03/17 0800  ceFAZolin (ANCEF) IVPB 1 g/50 mL premix     1 g 100 mL/hr over 30 Minutes Intravenous On call 06/02/17 1704 06/04/17 0800   06/03/17 0600  vancomycin (VANCOCIN) IVPB 1000 mg/200 mL premix  Status:  Discontinued     1,000 mg 200 mL/hr over  60 Minutes Intravenous Every 12 hours 06/02/17 1917 06/05/17 0756   06/02/17 2000  piperacillin-tazobactam (ZOSYN) IVPB 3.375 g  Status:  Discontinued     3.375 g 12.5 mL/hr over 240 Minutes Intravenous Every 8 hours 06/02/17 1912 06/05/17 0757         Objective: Vitals:   06/12/17 0800 06/12/17 0902 06/12/17 1100 06/12/17 1458  BP:      Pulse:      Resp: 16  (!) 26   Temp:      TempSrc:      SpO2: 93% 97% 98% 100%  Weight:      Height:        Intake/Output Summary (Last 24 hours) at 06/12/2017 1557 Last data filed at 06/12/2017 1000 Gross per 24 hour  Intake 500 ml  Output 650 ml  Net -150 ml   Filed Weights   06/02/17 1830 06/04/17 1008  Weight: 80.3 kg (177 lb) 80.3 kg (177 lb)    Examination:   General: Pt is alert, awake, not in acute distress Cardiovascular: RRR, S1/S2, no rubs, no gallops Respiratory: CTA bilaterally, no wheezing, no rhonchi Abdominal: Soft, NT, ND, bowel sounds + Extremities: Strong pulses, erythema in the arch of the foot with mild tenderness, mild swelling R    Data Reviewed: I have personally reviewed following labs and imaging studies  CBC: Recent Labs  Lab 06/06/17 0134  06/08/17 0232 06/09/17 0230 06/10/17 0443 06/11/17 0252 06/12/17 0310  WBC 13.9*   < > 15.8* 12.9* 12.9* 14.2* 13.9*  NEUTROABS 11.0*  --   --   --   --  10.0* 11.1*  HGB 9.4*   < > 9.3* 9.9* 9.6* 9.5* 9.2*  HCT 29.2*   < > 29.2* 31.0* 30.3* 30.1* 29.5*  MCV 95.4   < > 95.7 94.8 97.1 95.9 95.8  PLT 222   < > 304 365 386 415* 396   < > = values in this interval not displayed.   Basic Metabolic Panel: Recent Labs  Lab 06/07/17 0224 06/08/17 0232 06/09/17 0230 06/10/17 0443 06/11/17 0252 06/12/17 0310  NA 134* 136 139 138 138 137  K 4.4 4.3 4.5 4.3 4.4 4.3  CL 104 103 101 101 102 101  CO2 23 24 30 29 30 29   GLUCOSE 120* 190* 147* 191* 146* 151*  BUN 33* 38* 36* 31* 27* 26*  CREATININE 1.52* 1.49* 1.29* 1.31* 1.17 1.15  CALCIUM 8.1* 8.5* 8.9 9.0  8.7* 8.6*  MG 1.8  --   --   --   --   --    GFR: Estimated Creatinine Clearance: 67.5 mL/min (by C-G formula based on SCr of 1.15 mg/dL). Liver Function Tests: Recent Labs  Lab 06/06/17 0134 06/12/17 0310  AST 23  --   ALT 15*  --   ALKPHOS 109  --   BILITOT 0.6  --   PROT 5.8*  --   ALBUMIN 1.8* 1.8*   No results for input(s): LIPASE, AMYLASE in the last 168 hours. No results for input(s): AMMONIA in the last 168 hours. Coagulation Profile: No results for input(s): INR, PROTIME in the last  168 hours. Cardiac Enzymes: No results for input(s): CKTOTAL, CKMB, CKMBINDEX, TROPONINI in the last 168 hours. BNP (last 3 results) No results for input(s): PROBNP in the last 8760 hours. HbA1C: No results for input(s): HGBA1C in the last 72 hours. CBG: Recent Labs  Lab 06/11/17 1122 06/11/17 1629 06/11/17 2110 06/12/17 0619 06/12/17 1151  GLUCAP 138* 148* 161* 149* 188*   Lipid Profile: No results for input(s): CHOL, HDL, LDLCALC, TRIG, CHOLHDL, LDLDIRECT in the last 72 hours. Thyroid Function Tests: No results for input(s): TSH, T4TOTAL, FREET4, T3FREE, THYROIDAB in the last 72 hours. Anemia Panel: No results for input(s): VITAMINB12, FOLATE, FERRITIN, TIBC, IRON, RETICCTPCT in the last 72 hours. Sepsis Labs: No results for input(s): PROCALCITON, LATICACIDVEN in the last 168 hours.  Recent Results (from the past 240 hour(s))  Surgical pcr screen     Status: None   Collection Time: 06/02/17  8:08 PM  Result Value Ref Range Status   MRSA, PCR NEGATIVE NEGATIVE Final   Staphylococcus aureus NEGATIVE NEGATIVE Final    Comment: (NOTE) The Xpert SA Assay (FDA approved for NASAL specimens in patients 66 years of age and older), is one component of a comprehensive surveillance program. It is not intended to diagnose infection nor to guide or monitor treatment.       Radiology Studies: Mr Foot Right W Wo Contrast  Result Date: 06/11/2017 CLINICAL DATA:  Dry gangrene of  the right third toe. EXAM: MRI OF THE RIGHT FOREFOOT WITHOUT AND WITH CONTRAST TECHNIQUE: Multiplanar, multisequence MR imaging of the right forefoot was performed before and after the administration of intravenous contrast. CONTRAST:  63mL MULTIHANCE GADOBENATE DIMEGLUMINE 529 MG/ML IV SOLN COMPARISON:  None. FINDINGS: Bones/Joint/Cartilage Third toe and portion of the third metatarsal head. No adjacent marrow edema of the third metatarsal to suggest osteomyelitis. Reactive marrow edema is noted of the second third toes without cortical bone destruction. No acute fracture or malalignment. Ligaments Intact given limitations of soft tissue edema. Muscles and Tendons The extensor and flexor tendons to the remaining toes are intact and of normal signal intensity and morphology. Soft tissues There is an nonenhancing postop subcutaneous fluid collection measuring 4.8 x 1.7 x 1.3 cm along the plantar aspect of the distal second and third metatarsals tracking along the lateral aspect of the flexor digitorum longus and brevis tendons. IMPRESSION: 1. Status post amputation of the third toe and portion of the third metatarsal head without marrow signal abnormalities to indicate osteomyelitis of the third metatarsal. 2. There is mild reactive edema of the adjacent second and fourth toes without cortical bone destruction, fracture or joint dislocations. 3. Small nonenhancing plantar fluid collection of the forefoot measuring 4.8 x 1.7 x 1.3 cm likely postop in etiology. Electronically Signed   By: Ashley Royalty M.D.   On: 06/11/2017 12:47   Dg Chest Port 1 View  Result Date: 06/12/2017 CLINICAL DATA:  Shortness of breath. EXAM: PORTABLE CHEST 1 VIEW COMPARISON:  Radiograph of June 08, 2017. FINDINGS: Stable cardiomediastinal silhouette. No pneumothorax is noted. Mild interstitial densities are noted in the left lung base which may represent edema or inflammation. Atherosclerosis of thoracic aorta is noted. Stable moderate  right pleural effusion is noted with associated atelectasis or infiltrate. Bony thorax is unremarkable. IMPRESSION: Aortic atherosclerosis. Stable moderate right pleural effusion with associated atelectasis or infiltrate. Mild interstitial densities are noted in left lung base which may represent edema or inflammation. Electronically Signed   By: Marijo Conception, M.D.   On:  06/12/2017 12:18      Scheduled Meds: . aspirin EC  81 mg Oral Daily  . atorvastatin  10 mg Oral q1800  . collagenase   Topical Daily  . feeding supplement (GLUCERNA SHAKE)  237 mL Oral TID BM  . feeding supplement (PRO-STAT SUGAR FREE 64)  30 mL Oral BID  . gabapentin  600 mg Oral TID  . heparin injection (subcutaneous)  5,000 Units Subcutaneous Q8H  . insulin aspart  0-15 Units Subcutaneous TID WC  . ipratropium-albuterol  3 mL Nebulization TID  . multivitamin with minerals  1 tablet Oral Daily  . nicotine  14 mg Transdermal Daily  . pantoprazole  40 mg Oral Daily  . polyethylene glycol  17 g Oral BID  . senna-docusate  2 tablet Oral BID  . sodium chloride flush  3 mL Intravenous Q12H   Continuous Infusions: . sodium chloride    . ampicillin-sulbactam (UNASYN) IV 1.5 g (06/12/17 1506)     LOS: 10 days    Time spent: Total of 25 minutes spent with pt, greater than 50% of which was spent in discussion of  treatment, counseling and coordination of care   Chipper Oman, MD Pager: Text Page via [email or web address]   If 7PM-7AM, please contact night-coverage [email or web address] 06/12/2017, 3:57 PM

## 2017-06-12 NOTE — Consult Note (Signed)
ORTHOPAEDIC CONSULTATION  REQUESTING PHYSICIAN: Patrecia Pour, Christean Grief, MD  Chief Complaint: Ulceration with gangrene right forefoot status post revascularization status post third toe amputation.  HPI: Adrian Bean is a 66 y.o. male who presents with history of diabetes and smoking status post revascularization to the right lower extremity with an excellent dorsalis pedis pulse.  Patient is status post a third ray amputation and is seen in consultation for progressive necrotic changes around the wound as well as exposed second and fourth metatarsals with a fluctuant area on the plantar aspect of his foot.  Past Medical History:  Diagnosis Date  . Diabetic neuropathy ([place])    [name] 06/02/2017  . High cholesterol   . PAD (peripheral artery disease) ([place]) 06/02/2017  . Tobacco abuse 06/02/2017  . Type II diabetes mellitus ([place]) 06/02/2017   Past Surgical History:  Procedure Laterality Date  . ABDOMINAL AORTOGRAM N/A 06/03/2017   Procedure: ABDOMINAL AORTOGRAM;  Surgeon: Conrad [place], MD;  Location: [facility];  Service: Cardiovascular;  Laterality: N/A;  . AMPUTATION Right 06/04/2017   Procedure: AMPUTATION 3rd toeDIGIT;  Surgeon: Rosetta Posner, MD;  Location: [facility];  Service: Vascular;  Laterality: Right;  . CHOLECYSTECTOMY    . FEMORAL-POPLITEAL BYPASS GRAFT Right 06/04/2017   Procedure: BYPASS  FEMORAL-POPLITEAL ARTERY USING GREATER SAPHENOUS VEIN;  Surgeon: Rosetta Posner, MD;  Location: [facility];  Service: Vascular;  Laterality: Right;  . FOOT RAY RESECTION Left 2018   hx fourth and fifth ray amputation/notes 06/02/2017  . LOWER EXTREMITY ANGIOGRAPHY Bilateral 06/03/2017   Procedure: Lower Extremity Angiography;  Surgeon: Conrad Malone, MD;  Location: [facility];  Service: Cardiovascular;  Laterality: Bilateral;  . PERIPHERAL VASCULAR CATHETERIZATION Left 12/2016   angioplasty of iliac artery  hx/notes 06/02/2017  . VEIN HARVEST Right 06/04/2017   Procedure: VEIN HARVEST  RIGHT SAPHENOUS VEIN;  Surgeon: Rosetta Posner, MD;  Location: [facility];  Service: Vascular;  Laterality: Right;   Social History   Socioeconomic History  . Marital status: Married    Spouse name: None  . Number of children: None  . Years of education: None  . Highest education level: None  Social Needs  . Financial resource strain: None  . Food insecurity - worry: None  . Food insecurity - inability: None  . Transportation needs - medical: None  . Transportation needs - non-medical: None  Occupational History  . None  Tobacco Use  . Smoking status: Current Every Day Smoker    Packs/day: 0.50    Years: 50.00    Pack years: 25.00    Types: Cigarettes  . Smokeless tobacco: Never Used  Substance and Sexual Activity  . Alcohol use: No    Frequency: Never  . Drug use: No  . Sexual activity: Not Currently  Other Topics Concern  . None  Social History Narrative  . None   History reviewed. No pertinent family history. - negative except otherwise stated in the family history section No Known Allergies Prior to Admission medications   Medication Sig Start Date End Date Taking? Authorizing Provider  atorvastatin (LIPITOR) 10 MG tablet Take 10 mg by mouth daily at 6 PM.   Yes [provider]  gabapentin (NEURONTIN) 300 MG capsule Take 600 mg by mouth 3 (three) times daily.   Yes [provider]  glimepiride (AMARYL) 2 MG tablet Take 2 mg by mouth daily with breakfast.   Yes [provider]  lisinopril (PRINIVIL,ZESTRIL) 10 MG tablet  Take 10 mg by mouth daily.   Yes [provider]  Multiple Vitamin (MULTIVITAMIN WITH MINERALS) TABS tablet Take 1 tablet by mouth daily.   Yes [provider]  omega-3 acid ethyl esters (LOVAZA) 1 g capsule Take 1 g by mouth daily.   Yes [provider]   Mr Foot Right W Wo Contrast  Result Date: 06/11/2017 CLINICAL DATA:  Dry gangrene of the right third toe. EXAM: MRI OF THE RIGHT FOREFOOT WITHOUT  AND WITH CONTRAST TECHNIQUE: Multiplanar, multisequence MR imaging of the right forefoot was performed before and after the administration of intravenous contrast. CONTRAST:  29mL MULTIHANCE GADOBENATE DIMEGLUMINE 529 MG/ML IV SOLN COMPARISON:  None. FINDINGS: Bones/Joint/Cartilage Third toe and portion of the third metatarsal head. No adjacent marrow edema of the third metatarsal to suggest osteomyelitis. Reactive marrow edema is noted of the second third toes without cortical bone destruction. No acute fracture or malalignment. Ligaments Intact given limitations of soft tissue edema. Muscles and Tendons The extensor and flexor tendons to the remaining toes are intact and of normal signal intensity and morphology. Soft tissues There is an nonenhancing postop subcutaneous fluid collection measuring 4.8 x 1.7 x 1.3 cm along the plantar aspect of the distal second and third metatarsals tracking along the lateral aspect of the flexor digitorum longus and brevis tendons. IMPRESSION: 1. Status post amputation of the third toe and portion of the third metatarsal head without marrow signal abnormalities to indicate osteomyelitis of the third metatarsal. 2. There is mild reactive edema of the adjacent second and fourth toes without cortical bone destruction, fracture or joint dislocations. 3. Small nonenhancing plantar fluid collection of the forefoot measuring 4.8 x 1.7 x 1.3 cm likely postop in etiology. Electronically Signed   By: Ashley Royalty M.D.   On: 06/11/2017 12:47   Dg Chest Port 1 View  Result Date: 06/12/2017 CLINICAL DATA:  Shortness of breath. EXAM: PORTABLE CHEST 1 VIEW COMPARISON:  Radiograph of June 08, 2017. FINDINGS: Stable cardiomediastinal silhouette. No pneumothorax is noted. Mild interstitial densities are noted in the left lung base which may represent edema or inflammation. Atherosclerosis of thoracic aorta is noted. Stable moderate right pleural effusion is noted with associated atelectasis  or infiltrate. Bony thorax is unremarkable. IMPRESSION: Aortic atherosclerosis. Stable moderate right pleural effusion with associated atelectasis or infiltrate. Mild interstitial densities are noted in left lung base which may represent edema or inflammation. Electronically Signed   By: Marijo Conception, M.D.   On: 06/12/2017 12:18   - pertinent xrays, CT, MRI studies were reviewed and independently interpreted  Positive ROS: All other systems have been reviewed and were otherwise negative with the exception of those mentioned in the HPI and as above.  Physical Exam: General: Alert, no acute distress Psychiatric: Patient is competent for consent with normal mood and affect Lymphatic: No axillary or cervical lymphadenopathy Cardiovascular: No pedal edema Respiratory: No cyanosis, no use of accessory musculature GI: No organomegaly, abdomen is soft and non-tender  Skin: Examination the medial border of the fourth toe and metatarsal is ischemic and gangrenous as well as the lateral border of the second toe being ischemic and gangrenous with exposed second and fourth metatarsal heads.   Neurologic: Patient does not have protective sensation bilateral lower extremities.   MUSCULOSKELETAL:  Examination patient has an excellent dorsalis pedis pulse status post revascularization to the right lower extremity.  The wound bed status post third toe amputation is necrotic there is exposed third metatarsal there is  exposed second metatarsal and exposed fourth metatarsal.  There is a fluctuant area on the plantar aspect of the foot which could be ischemic tissue is well.  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  Plan: Plan: I feel patient's only option for foot salvage would be a transmetatarsal amputation.  Do not feel that  leaving the great toe would provide him a stable foot for ambulation.  I will plan for surgery tomorrow Friday.  Patient is to be n.p.o. after midnight.  Risks and benefits were discussed with the patient he states he understands and wishes to proceed with surgery.  Thank you for the consult and the opportunity to see Mr. Cathi Roan, MD [facility] [phone] 7:10 PM

## 2017-06-13 ENCOUNTER — Encounter (HOSPITAL_COMMUNITY): Payer: Self-pay | Admitting: *Deleted

## 2017-06-13 ENCOUNTER — Inpatient Hospital Stay (HOSPITAL_COMMUNITY): Payer: Medicare Other | Admitting: Anesthesiology

## 2017-06-13 ENCOUNTER — Encounter (HOSPITAL_COMMUNITY)
Admission: AD | Disposition: A | Discharge status: Skilled Nursing Facility | Payer: Self-pay | Source: Other Acute Inpatient Hospital | Attending: Internal Medicine

## 2017-06-13 DIAGNOSIS — I70261 Atherosclerosis of native arteries of extremities with gangrene, right leg: Secondary | ICD-10-CM

## 2017-06-13 HISTORY — PX: AMPUTATION: SHX166

## 2017-06-13 LAB — GLUCOSE, CAPILLARY
GLUCOSE-CAPILLARY: 154 mg/dL — AB (ref 65–99)
GLUCOSE-CAPILLARY: 135 mg/dL — AB (ref 65–99)
Glucose-Capillary: 199 mg/dL — ABNORMAL HIGH (ref 65–99)
Glucose-Capillary: 141 mg/dL — ABNORMAL HIGH (ref 65–99)
Glucose-Capillary: 135 mg/dL — ABNORMAL HIGH (ref 65–99)
Glucose-Capillary: 151 mg/dL — ABNORMAL HIGH (ref 65–99)
Glucose-Capillary: 156 mg/dL — ABNORMAL HIGH (ref 65–99)

## 2017-06-13 LAB — CBC WITH DIFFERENTIAL/PLATELET
Basophils Absolute: 0.1 10*3/uL (ref 0.0–0.1)
Basophils Relative: 0 %
Eosinophils Absolute: 0.4 10*3/uL (ref 0.0–0.7)
Eosinophils Relative: 4 %
HCT: 28.9 % — ABNORMAL LOW (ref 39.0–52.0)
HEMOGLOBIN: 9.2 g/dL — AB (ref 13.0–17.0)
LYMPHS ABS: 1.6 10*3/uL (ref 0.7–4.0)
LYMPHS PCT: 14 %
MCH: 30.9 pg (ref 26.0–34.0)
MCHC: 31.8 g/dL (ref 30.0–36.0)
MCV: 97 fL (ref 78.0–100.0)
MONOS PCT: 8 %
Monocytes Absolute: 0.9 10*3/uL (ref 0.1–1.0)
NEUTROS PCT: 74 %
Neutro Abs: 8.4 10*3/uL — ABNORMAL HIGH (ref 1.7–7.7)
Platelets: 388 10*3/uL (ref 150–400)
RBC: 2.98 MIL/uL — AB (ref 4.22–5.81)
RDW: 13.1 % (ref 11.5–15.5)
WBC: 11.3 10*3/uL — AB (ref 4.0–10.5)

## 2017-06-13 LAB — BASIC METABOLIC PANEL
Anion gap: 6 (ref 5–15)
BUN: 24 mg/dL — AB (ref 6–20)
CHLORIDE: 100 mmol/L — AB (ref 101–111)
CO2: 30 mmol/L (ref 22–32)
CREATININE: 1.16 mg/dL (ref 0.61–1.24)
Calcium: 8.5 mg/dL — ABNORMAL LOW (ref 8.9–10.3)
GFR calc Af Amer: >60 mL/min (ref >60)
GFR calc non Af Amer: >60 mL/min (ref >60)
GLUCOSE: 152 mg/dL — AB (ref 65–99)
POTASSIUM: 4.2 mmol/L (ref 3.5–5.1)
Sodium: 136 mmol/L (ref 135–145)

## 2017-06-13 SURGERY — AMPUTATION, FOOT, RAY
Anesthesia: General | Site: Foot | Laterality: Right

## 2017-06-13 MED ORDER — FENTANYL CITRATE (PF) 250 MCG/5ML IJ SOLN
INTRAMUSCULAR | Status: AC
  Filled 2017-06-13: qty 5

## 2017-06-13 MED ORDER — METOCLOPRAMIDE HCL 5 MG PO TABS: 5.0000 mg | ORAL_TABLET | Freq: Three times a day (TID) | ORAL | Status: DC | PRN

## 2017-06-13 MED ORDER — FENTANYL CITRATE (PF) 100 MCG/2ML IJ SOLN
INTRAMUSCULAR | Status: DC | PRN
  Administered 2017-06-13: 50 ug via INTRAVENOUS

## 2017-06-13 MED ORDER — FUROSEMIDE 10 MG/ML IJ SOLN
40.0000 mg | Freq: Once | INTRAMUSCULAR | Status: AC
  Administered 2017-06-13: 40 mg via INTRAVENOUS
  Filled 2017-06-13: qty 4

## 2017-06-13 MED ORDER — POLYETHYLENE GLYCOL 3350 17 G PO PACK: 17.0000 g | PACK | Freq: Every day | ORAL | Status: DC | PRN

## 2017-06-13 MED ORDER — PHENYLEPHRINE HCL 10 MG/ML IJ SOLN
INTRAMUSCULAR | Status: DC | PRN
  Administered 2017-06-13 (×3): 120 ug via INTRAVENOUS

## 2017-06-13 MED ORDER — ONDANSETRON HCL 4 MG PO TABS: 4.0000 mg | ORAL_TABLET | Freq: Four times a day (QID) | ORAL | Status: DC | PRN

## 2017-06-13 MED ORDER — METOCLOPRAMIDE HCL 5 MG/ML IJ SOLN: 5.0000 mg | Freq: Three times a day (TID) | INTRAMUSCULAR | Status: DC | PRN

## 2017-06-13 MED ORDER — PROPOFOL 10 MG/ML IV BOLUS
INTRAVENOUS | Status: AC
  Filled 2017-06-13: qty 20

## 2017-06-13 MED ORDER — HYDROMORPHONE HCL 1 MG/ML IJ SOLN
INTRAMUSCULAR | Status: AC
  Administered 2017-06-13: 0.5 mg via INTRAVENOUS
  Filled 2017-06-13: qty 1

## 2017-06-13 MED ORDER — LACTATED RINGERS IV SOLN
INTRAVENOUS | Status: DC | PRN
  Administered 2017-06-13: 14:00:00 via INTRAVENOUS

## 2017-06-13 MED ORDER — ONDANSETRON HCL 4 MG/2ML IJ SOLN
INTRAMUSCULAR | Status: AC
  Filled 2017-06-13: qty 2

## 2017-06-13 MED ORDER — ONDANSETRON HCL 4 MG/2ML IJ SOLN
INTRAMUSCULAR | Status: DC | PRN
  Administered 2017-06-13: 4 mg via INTRAVENOUS

## 2017-06-13 MED ORDER — MIDAZOLAM HCL 2 MG/2ML IJ SOLN
INTRAMUSCULAR | Status: AC
  Filled 2017-06-13: qty 2

## 2017-06-13 MED ORDER — PROPOFOL 10 MG/ML IV BOLUS
INTRAVENOUS | Status: DC | PRN
  Administered 2017-06-13: 150 mg via INTRAVENOUS

## 2017-06-13 MED ORDER — DOCUSATE SODIUM 100 MG PO CAPS
100.0000 mg | ORAL_CAPSULE | Freq: Two times a day (BID) | ORAL | Status: DC
  Administered 2017-06-13 – 2017-06-16 (×4): 100 mg via ORAL
  Filled 2017-06-13 (×6): qty 1

## 2017-06-13 MED ORDER — LIDOCAINE HCL (CARDIAC) 20 MG/ML IV SOLN
INTRAVENOUS | Status: DC | PRN
  Administered 2017-06-13: 80 mg via INTRAVENOUS

## 2017-06-13 MED ORDER — HYDROMORPHONE HCL 1 MG/ML IJ SOLN
0.2500 mg | INTRAMUSCULAR | Status: DC | PRN
  Administered 2017-06-13 (×2): 0.5 mg via INTRAVENOUS

## 2017-06-13 MED ORDER — PHENYLEPHRINE 40 MCG/ML (10ML) SYRINGE FOR IV PUSH (FOR BLOOD PRESSURE SUPPORT)
PREFILLED_SYRINGE | INTRAVENOUS | Status: AC
  Filled 2017-06-13: qty 10

## 2017-06-13 MED ORDER — 0.9 % SODIUM CHLORIDE (POUR BTL) OPTIME
TOPICAL | Status: DC | PRN
  Administered 2017-06-13: 1000 mL

## 2017-06-13 MED ORDER — BISACODYL 10 MG RE SUPP: 10.0000 mg | Freq: Every day | RECTAL | Status: DC | PRN

## 2017-06-13 MED ORDER — SODIUM CHLORIDE 0.9 % IV SOLN
INTRAVENOUS | Status: DC
  Administered 2017-06-13: 18:00:00 via INTRAVENOUS

## 2017-06-13 MED ORDER — ONDANSETRON HCL 4 MG/2ML IJ SOLN: 4.0000 mg | Freq: Four times a day (QID) | INTRAMUSCULAR | Status: DC | PRN

## 2017-06-13 MED ORDER — OXYCODONE HCL 5 MG PO TABS: 5.0000 mg | ORAL_TABLET | Freq: Once | ORAL | Status: DC | PRN

## 2017-06-13 MED ORDER — OXYCODONE HCL 5 MG/5ML PO SOLN: 5.0000 mg | Freq: Once | ORAL | Status: DC | PRN

## 2017-06-13 MED ORDER — METHOCARBAMOL 500 MG PO TABS
500.0000 mg | ORAL_TABLET | Freq: Four times a day (QID) | ORAL | Status: DC | PRN
  Administered 2017-06-14 – 2017-06-16 (×4): 500 mg via ORAL
  Filled 2017-06-13 (×4): qty 1

## 2017-06-13 MED ORDER — DEXTROSE 5 % IV SOLN: 500.0000 mg | Freq: Four times a day (QID) | INTRAVENOUS | Status: DC | PRN

## 2017-06-13 MED ORDER — OXYCODONE HCL 5 MG PO TABS: 5.0000 mg | ORAL_TABLET | ORAL | Status: DC | PRN

## 2017-06-13 MED ORDER — MAGNESIUM CITRATE PO SOLN: 1.0000 | Freq: Once | ORAL | Status: DC | PRN

## 2017-06-13 MED ORDER — ACETAMINOPHEN 650 MG RE SUPP: 650.0000 mg | RECTAL | Status: DC | PRN

## 2017-06-13 MED ORDER — ACETAMINOPHEN 325 MG PO TABS: 650.0000 mg | ORAL_TABLET | ORAL | Status: DC | PRN

## 2017-06-13 MED ORDER — FENTANYL CITRATE (PF) 100 MCG/2ML IJ SOLN: 25.0000 ug | INTRAMUSCULAR | Status: DC | PRN

## 2017-06-13 SURGICAL SUPPLY — 32 items
BLADE SAW SGTL MED 73X18.5 STR (BLADE) IMPLANT
BLADE SURG 21 STRL SS (BLADE) ×3 IMPLANT
BNDG COHESIVE 4X5 TAN STRL (GAUZE/BANDAGES/DRESSINGS) ×3 IMPLANT
BNDG GAUZE ELAST 4 BULKY (GAUZE/BANDAGES/DRESSINGS) ×3 IMPLANT
CANISTER WOUNDNEG PRESSURE 500 (CANNISTER) ×3 IMPLANT
COVER SURGICAL LIGHT HANDLE (MISCELLANEOUS) ×6 IMPLANT
DRAPE INCISE IOBAN 66X45 STRL (DRAPES) ×3 IMPLANT
DRAPE U-SHAPE 47X51 STRL (DRAPES) ×6 IMPLANT
DRESSING PREVENA PLUS CUSTOM (GAUZE/BANDAGES/DRESSINGS) ×1 IMPLANT
DRSG ADAPTIC 3X8 NADH LF (GAUZE/BANDAGES/DRESSINGS) ×3 IMPLANT
DRSG PAD ABDOMINAL 8X10 ST (GAUZE/BANDAGES/DRESSINGS) ×6 IMPLANT
DRSG PREVENA PLUS CUSTOM (GAUZE/BANDAGES/DRESSINGS) ×3
DURAPREP 26ML APPLICATOR (WOUND CARE) ×3 IMPLANT
ELECT REM PT RETURN 9FT ADLT (ELECTROSURGICAL) ×3
ELECTRODE REM PT RTRN 9FT ADLT (ELECTROSURGICAL) ×1 IMPLANT
GAUZE SPONGE 4X4 12PLY STRL (GAUZE/BANDAGES/DRESSINGS) ×3 IMPLANT
GLOVE BIOGEL PI IND STRL 9 (GLOVE) ×1 IMPLANT
GLOVE BIOGEL PI INDICATOR 9 (GLOVE) ×2
GLOVE SURG ORTHO 9.0 STRL STRW (GLOVE) ×3 IMPLANT
GOWN STRL REUS W/ TWL XL LVL3 (GOWN DISPOSABLE) ×2 IMPLANT
GOWN STRL REUS W/TWL XL LVL3 (GOWN DISPOSABLE) ×4
KIT BASIN OR (CUSTOM PROCEDURE TRAY) ×3 IMPLANT
KIT ROOM TURNOVER OR (KITS) ×3 IMPLANT
NS IRRIG 1000ML POUR BTL (IV SOLUTION) ×3 IMPLANT
PACK ORTHO EXTREMITY (CUSTOM PROCEDURE TRAY) ×3 IMPLANT
PAD ARMBOARD 7.5X6 YLW CONV (MISCELLANEOUS) ×6 IMPLANT
STOCKINETTE IMPERVIOUS LG (DRAPES) IMPLANT
SUT ETHILON 2 0 PSLX (SUTURE) ×6 IMPLANT
TOWEL OR 17X26 10 PK STRL BLUE (TOWEL DISPOSABLE) ×3 IMPLANT
TUBE CONNECTING 12'X1/4 (SUCTIONS) ×1
TUBE CONNECTING 12X1/4 (SUCTIONS) ×2 IMPLANT
YANKAUER SUCT BULB TIP NO VENT (SUCTIONS) ×3 IMPLANT

## 2017-06-13 NOTE — Anesthesia Postprocedure Evaluation (Signed)
Anesthesia Post Note  Patient: Adrian Bean  Procedure(s) Performed: RIGHT TRANSMETATARSAL AMPUTATION (Right Foot)     Patient location during evaluation: PACU Anesthesia Type: General Level of consciousness: awake Pain management: pain level controlled Vital Signs Assessment: post-procedure vital signs reviewed and stable Respiratory status: spontaneous breathing Cardiovascular status: stable Postop Assessment: no apparent nausea or vomiting Anesthetic complications: no    Last Vitals:  Vitals:   06/13/17 1540 06/13/17 1555  BP: 109/76 121/73  Pulse: 82 79  Resp: 12 12  Temp:    SpO2: 93% 94%    Last Pain:  Vitals:   06/13/17 1555  TempSrc:   PainSc: Asleep   Pain Goal: Patients Stated Pain Goal: 3 (06/13/17 1000)               [place]

## 2017-06-13 NOTE — Progress Notes (Signed)
  Progress Note    06/13/2017 7:38 AM 9 Days Post-Op  Subjective: No new complaints per patient   Vitals:   06/12/17 2056 06/13/17 0600  BP: 117/72 122/76  Pulse:  83  Resp: 20 16  Temp: 98.4 F (36.9 C) 98.8 F (37.1 C)  SpO2: 99% 91%   Physical Exam: Cardiac:  RRR Lungs:  Scattered adventitious sounds all lung fields Incisions:  R groin and medial thigh incisions are soft with well approximated incision edges; no obvious infection Extremities:  Some pitting edema up to level of distal R shin; palpable R AT Abdomen:  Soft Neurologic: A&O  CBC    Component Value Date/Time   WBC 13.9 (H) 06/12/2017 0310   RBC 3.08 (L) 06/12/2017 0310   HGB 9.2 (L) 06/12/2017 0310   HCT 29.5 (L) 06/12/2017 0310   PLT 396 06/12/2017 0310   MCV 95.8 06/12/2017 0310   MCH 29.9 06/12/2017 0310   MCHC 31.2 06/12/2017 0310   RDW 13.2 06/12/2017 0310   LYMPHSABS 1.5 06/12/2017 0310   MONOABS 1.0 06/12/2017 0310   EOSABS 0.3 06/12/2017 0310   BASOSABS 0.0 06/12/2017 0310    BMET    Component Value Date/Time   NA 137 06/12/2017 0310   K 4.3 06/12/2017 0310   CL 101 06/12/2017 0310   CO2 29 06/12/2017 0310   GLUCOSE 151 (H) 06/12/2017 0310   BUN 26 (H) 06/12/2017 0310   CREATININE 1.15 06/12/2017 0310   CALCIUM 8.6 (L) 06/12/2017 0310   GFRNONAA >60 06/12/2017 0310   GFRAA >60 06/12/2017 0310    INR    Component Value Date/Time   INR 1.25 06/04/2017 0332     Intake/Output Summary (Last 24 hours) at 06/13/2017 0738 Last data filed at 06/12/2017 2126 Gross per 24 hour  Intake 236 ml  Output 725 ml  Net -489 ml     Assessment/Plan:  66 y.o. male is s/p R femoral to AK popliteal bypass with GSV conduit and 3rd toe amp 9 Days Post-Op   Palpable R AT on exam today 3rd toe amp site not healing and fluid collection by MRI Plan is for R TMA by Dr. Sharol Given today; patient is NPO  Adrian Ligas, PA-C [facility] [phone] 06/13/2017 7:38 AM

## 2017-06-13 NOTE — Progress Notes (Signed)
Patient arrived back from PACU.  Vital signs stable, pedal pulse present in R foot post surgery, pt alert but drowsy from surgery.

## 2017-06-13 NOTE — Anesthesia Procedure Notes (Signed)
Procedure Name: LMA Insertion Date/Time: 06/13/2017 2:31 PM Performed by: Inda Coke, CRNA Pre-anesthesia Checklist: Patient identified, Emergency Drugs available, Suction available and Patient being monitored Patient Re-evaluated:Patient Re-evaluated prior to induction Oxygen Delivery Method: Circle System Utilized Preoxygenation: Pre-oxygenation with 100% oxygen Induction Type: IV induction Ventilation: Mask ventilation without difficulty LMA: LMA inserted LMA Size: 5.0 Number of attempts: 1 Airway Equipment and Method: Bite block Placement Confirmation: positive ETCO2 and breath sounds checked- equal and bilateral Tube secured with: Tape Dental Injury: Teeth and Oropharynx as per pre-operative assessment

## 2017-06-13 NOTE — Interval H&P Note (Signed)
History and Physical Interval Note:  06/13/2017 8:02 AM  Adrian Bean  has presented today for surgery, with the diagnosis of gangrene right forefoot  The various methods of treatment have been discussed with the patient and family. After consideration of risks, benefits and other options for treatment, the patient has consented to  Procedure(s): RIGHT TRANSMETATARSAL AMPUTATION (Right) as a surgical intervention .  The patient's history has been reviewed, patient examined, no change in status, stable for surgery.  I have reviewed the patient's chart and labs.  Questions were answered to the patient's satisfaction.     [name]

## 2017-06-13 NOTE — Transfer of Care (Signed)
Immediate Anesthesia Transfer of Care Note  Patient: Adrian Bean  Procedure(s) Performed: RIGHT TRANSMETATARSAL AMPUTATION (Right Foot)  Patient Location: PACU  Anesthesia Type:General  Level of Consciousness: awake and alert   Airway & Oxygen Therapy: Patient Spontanous Breathing and Patient connected to nasal cannula oxygen  Post-op Assessment: Report given to RN and Post -op Vital signs reviewed and stable  Post vital signs: Reviewed and stable  Last Vitals:  Vitals:   06/13/17 1131 06/13/17 1510  BP:    Pulse: 84   Resp: 20   Temp:  36.6 C  SpO2:      Last Pain:  Vitals:   06/13/17 1510  TempSrc:   PainSc: 0-No pain      Patients Stated Pain Goal: 3 (62/86/38 1771)  Complications: No apparent anesthesia complications

## 2017-06-13 NOTE — Anesthesia Preprocedure Evaluation (Addendum)
Anesthesia Evaluation  Patient identified by MRN, date of birth, ID band Patient awake    Reviewed: Allergy & Precautions, H&P , Patient's Chart, lab work & pertinent test results, reviewed documented beta blocker date and time   Airway Mallampati: II  TM Distance: >3 FB Neck ROM: full    Dental no notable dental hx. (+) Dental Advisory Given   Pulmonary Current Smoker,    Pulmonary exam normal breath sounds clear to auscultation       Cardiovascular + Peripheral Vascular Disease   Rhythm:regular Rate:Normal  gangrene   Neuro/Psych    GI/Hepatic   Endo/Other  diabetes, Poorly Controlled  Renal/GU      Musculoskeletal   Abdominal   Peds  Hematology  (+) anemia ,   Anesthesia Other Findings  Systolic function was   normal. The estimated ejection fraction was in the range of 55%   to 60%. Wall motion was normal; there were no regional wall   motion abnormalities.  Reproductive/Obstetrics                           Anesthesia Physical Anesthesia Plan  ASA: III  Anesthesia Plan: General   Post-op Pain Management:    Induction: Intravenous  PONV Risk Score and Plan: 2 and Treatment may vary due to age or medical condition  Airway Management Planned: LMA  Additional Equipment:   Intra-op Plan:   Post-operative Plan:   Informed Consent: I have reviewed the patients History and Physical, chart, labs and discussed the procedure including the risks, benefits and alternatives for the proposed anesthesia with the patient or authorized representative who has indicated his/her understanding and acceptance.   Dental Advisory Given  Plan Discussed with: CRNA and Surgeon  Anesthesia Plan Comments: ( )        Anesthesia Quick Evaluation

## 2017-06-13 NOTE — Progress Notes (Signed)
PROGRESS NOTE [facility]   Adrian Bean   XNA:[id number] DOB: Oct 31, 1950  DOA: 06/02/2017 PCP: [name], MD   Brief Narrative:  Adrian Bean is a 66 y/o M with with PMH of type II DM, neuropathy, PAD, left foot amputation, tobacco abuse; admitted on 06/02/2017, presented with complaint of right leg wound on third toe, was found to have dry gangrene.  Initially admitted at [facility] for cellulitis and gangrene and started on IV antibiotics. CTA showed extensive bilateral PVD and patient was transferred to [facility] after discussion with vascular surgery.  Underwent bilateral angiogram and underwent for aortofemoral popliteal bypass and R 3rd toe amputation. Patient developed atelectasis and was placed on O2 supplementation. Follow up post op, patient with increase in WBC and R foot erythema. MRI does not show osteomyelitis    Subjective: Continues with no complaints, ortho eval recommended TMA. Patient in agreement. Denies SOB and chest pain.    Assessment & Plan: 1. Right third toe gangrene.   S/P amputation of the right third toe.  06/04/2017 Right femoral to popliteal bypass.  06/04/2017 Peripheral vascular disease. Sepsis secondary to cellulitis. 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 Presented with sepsis physiology in the outside facility currently resolved. MRSA PCR is negative. Initially on IV vancomycin and Zosyn.  Switch to Unasyn and stopped on 06/06/2017. Due to leukocytosis vascular surgery resumed antibiotics. MRI of the R foot does not shows osteo or abscess, there is fluid collection at the base of his third metatarsal in the arch of the foot, Vascular sx ask orthopedic for evaluation. Unclear sources of leuko - maybe reactive. Continue to monitor cbc   2. Type 2 diabetes mellitus. Hemoglobin A1c 6.3. Diabetic neuropathy. CBG's stable  Holding home oral hypoglycemics Continue current regimen   3.  Tobacco abuse  Smoking cessation discussed   4.  Hepatic flexure of the colon thickening. Incidentally noted on the CT scan. Will require outpatient GI follow-up with colonoscopy.  5.  Acute hypoxic respiratory failure. - pleural effusion with suspected pneumonia  Wean O2 as tolerated to RA, patient does not use O2 at baseline. Vascular congestion noted on chest x-ray 12/9, repeated CXR 12/13 shows stable moderate R pleural effusion with atelectasis vs infiltrates - patient on Zosyn will switch to doxy  PNA - patient with cough, increase WBC and hypoxic. Continue doxy for now.  Initially responded to IV Lasix. then lasix placed on hold, Will continue Lasix PRN.  Will repeat CXR in AM  OOB as tolerated    6.  Fever - Resolved  Postoperative fever most likely atelectasis versus effective anesthesia. Already on antibiotic.  7.  Acute kidney injury. - Resolved  Patient was given lasix due to hypoalbuminemia  Continue to monitor for now   DVT prophylaxis: Heparin sq Code Status: Full  Family Communication: Non at bedside  Disposition Plan: Pending vascular surgery clearance   Consultants:   Vascular surgery   Procedures:  S/P amputation of the right third toe.  06/04/2017 Right femoral to popliteal bypass.  06/04/2017  Antimicrobials: Anti-infectives (From admission, onward)   Start     Dose/Rate Route Frequency Ordered Stop   06/13/17 0800  ceFAZolin (ANCEF) IVPB 2g/100 mL premix     2 g 200 mL/hr over 30 Minutes Intravenous To [facility] 06/12/17 2108 06/14/17 0800   06/12/17 2200  doxycycline (VIBRA-TABS) tablet 100 mg     100 mg Oral Every 12 hours 06/12/17 1623  06/08/17 0830  ampicillin-sulbactam (UNASYN) 1.5 g in sodium chloride 0.9 % 50 mL IVPB  Status:  Discontinued     1.5 g 100 mL/hr over 30 Minutes Intravenous Every 6 hours 06/08/17 0716 06/12/17 1623   06/05/17 1130  ampicillin-sulbactam (UNASYN) 1.5 g in sodium chloride 0.9 % 50 mL IVPB  Status:   Discontinued     1.5 g 100 mL/hr over 30 Minutes Intravenous Every 6 hours 06/05/17 0841 06/06/17 1308   06/03/17 0800  ceFAZolin (ANCEF) IVPB 1 g/50 mL premix     1 g 100 mL/hr over 30 Minutes Intravenous On call 06/02/17 1704 06/04/17 0800   06/03/17 0600  vancomycin (VANCOCIN) IVPB 1000 mg/200 mL premix  Status:  Discontinued     1,000 mg 200 mL/hr over 60 Minutes Intravenous Every 12 hours 06/02/17 1917 06/05/17 0756   06/02/17 2000  piperacillin-tazobactam (ZOSYN) IVPB 3.375 g  Status:  Discontinued     3.375 g 12.5 mL/hr over 240 Minutes Intravenous Every 8 hours 06/02/17 1912 06/05/17 0757         Objective: Vitals:   06/12/17 1621 06/12/17 2031 06/12/17 2056 06/13/17 0600  BP: 114/71  117/72 122/76  Pulse: 84 78  83  Resp:  18 20 16   Temp: 98.8 F (37.1 C)  98.4 F (36.9 C) 98.8 F (37.1 C)  TempSrc: Oral  Oral Oral  SpO2:  100% 99% 91%  Weight:      Height:        Intake/Output Summary (Last 24 hours) at 06/13/2017 0940 Last data filed at 06/12/2017 2126 Gross per 24 hour  Intake 236 ml  Output 725 ml  Net -489 ml   Filed Weights   06/02/17 1830 06/04/17 1008  Weight: 80.3 kg (177 lb) 80.3 kg (177 lb)    Examination:   General: NAD  Cardiovascular: RRR, S1/S2 +, no rubs, no gallops Respiratory: Decrease breath sounds on the R, bibasilar crackles, no wheezing  Abdominal: Soft, NT, ND, bowel sounds + Extremities: R foot Erythema along the arch of the foot, tender to palpation    Data Reviewed: I have personally reviewed following labs and imaging studies  CBC: Recent Labs  Lab 06/09/17 0230 06/10/17 0443 06/11/17 0252 06/12/17 0310 06/13/17 0740  WBC 12.9* 12.9* 14.2* 13.9* 11.3*  NEUTROABS  --   --  10.0* 11.1* 8.4*  HGB 9.9* 9.6* 9.5* 9.2* 9.2*  HCT 31.0* 30.3* 30.1* 29.5* 28.9*  MCV 94.8 97.1 95.9 95.8 97.0  PLT 365 386 415* 396 947   Basic Metabolic Panel: Recent Labs  Lab 06/07/17 0224  06/09/17 0230 06/10/17 0443 06/11/17 0252  06/12/17 0310 06/13/17 0740  NA 134*   < > 139 138 138 137 136  K 4.4   < > 4.5 4.3 4.4 4.3 4.2  CL 104   < > 101 101 102 101 100*  CO2 23   < > 30 29 30 29 30   GLUCOSE 120*   < > 147* 191* 146* 151* 152*  BUN 33*   < > 36* 31* 27* 26* 24*  CREATININE 1.52*   < > 1.29* 1.31* 1.17 1.15 1.16  CALCIUM 8.1*   < > 8.9 9.0 8.7* 8.6* 8.5*  MG 1.8  --   --   --   --   --   --    < > = values in this interval not displayed.   GFR: Estimated Creatinine Clearance: 66.9 mL/min (by C-G formula based on SCr of 1.16 mg/dL).  Liver Function Tests: Recent Labs  Lab 06/12/17 0310  ALBUMIN 1.8*   No results for input(s): LIPASE, AMYLASE in the last 168 hours. No results for input(s): AMMONIA in the last 168 hours. Coagulation Profile: No results for input(s): INR, PROTIME in the last 168 hours. Cardiac Enzymes: No results for input(s): CKTOTAL, CKMB, CKMBINDEX, TROPONINI in the last 168 hours. BNP (last 3 results) No results for input(s): PROBNP in the last 8760 hours. HbA1C: No results for input(s): HGBA1C in the last 72 hours. CBG: Recent Labs  Lab 06/12/17 0619 06/12/17 1151 06/12/17 1625 06/12/17 2055 06/13/17 0558  GLUCAP 149* 188* 177* 199* 154*   Lipid Profile: No results for input(s): CHOL, HDL, LDLCALC, TRIG, CHOLHDL, LDLDIRECT in the last 72 hours. Thyroid Function Tests: No results for input(s): TSH, T4TOTAL, FREET4, T3FREE, THYROIDAB in the last 72 hours. Anemia Panel: No results for input(s): VITAMINB12, FOLATE, FERRITIN, TIBC, IRON, RETICCTPCT in the last 72 hours. Sepsis Labs: No results for input(s): PROCALCITON, LATICACIDVEN in the last 168 hours.  No results found for this or any previous visit (from the past 240 hour(s)).    Radiology Studies: Mr Foot Right W Wo Contrast  Result Date: 06/11/2017 CLINICAL DATA:  Dry gangrene of the right third toe. EXAM: MRI OF THE RIGHT FOREFOOT WITHOUT AND WITH CONTRAST TECHNIQUE: Multiplanar, multisequence MR imaging of the  right forefoot was performed before and after the administration of intravenous contrast. CONTRAST:  39mL MULTIHANCE GADOBENATE DIMEGLUMINE 529 MG/ML IV SOLN COMPARISON:  None. FINDINGS: Bones/Joint/Cartilage Third toe and portion of the third metatarsal head. No adjacent marrow edema of the third metatarsal to suggest osteomyelitis. Reactive marrow edema is noted of the second third toes without cortical bone destruction. No acute fracture or malalignment. Ligaments Intact given limitations of soft tissue edema. Muscles and Tendons The extensor and flexor tendons to the remaining toes are intact and of normal signal intensity and morphology. Soft tissues There is an nonenhancing postop subcutaneous fluid collection measuring 4.8 x 1.7 x 1.3 cm along the plantar aspect of the distal second and third metatarsals tracking along the lateral aspect of the flexor digitorum longus and brevis tendons. IMPRESSION: 1. Status post amputation of the third toe and portion of the third metatarsal head without marrow signal abnormalities to indicate osteomyelitis of the third metatarsal. 2. There is mild reactive edema of the adjacent second and fourth toes without cortical bone destruction, fracture or joint dislocations. 3. Small nonenhancing plantar fluid collection of the forefoot measuring 4.8 x 1.7 x 1.3 cm likely postop in etiology. Electronically Signed   By: Ashley Adrian M.D.   On: 06/11/2017 12:47   Dg Chest Port 1 View  Result Date: 06/12/2017 CLINICAL DATA:  Shortness of breath. EXAM: PORTABLE CHEST 1 VIEW COMPARISON:  Radiograph of June 08, 2017. FINDINGS: Stable cardiomediastinal silhouette. No pneumothorax is noted. Mild interstitial densities are noted in the left lung base which may represent edema or inflammation. Atherosclerosis of thoracic aorta is noted. Stable moderate right pleural effusion is noted with associated atelectasis or infiltrate. Bony thorax is unremarkable. IMPRESSION: Aortic  atherosclerosis. Stable moderate right pleural effusion with associated atelectasis or infiltrate. Mild interstitial densities are noted in left lung base which may represent edema or inflammation. Electronically Signed   By: Marijo Conception, M.D.   On: 06/12/2017 12:18      Scheduled Meds: . aspirin EC  81 mg Oral Daily  . atorvastatin  10 mg Oral q1800  . collagenase   Topical  Daily  . doxycycline  100 mg Oral Q12H  . feeding supplement (GLUCERNA SHAKE)  237 mL Oral TID BM  . feeding supplement (PRO-STAT SUGAR FREE 64)  30 mL Oral BID  . furosemide  40 mg Intravenous Once  . gabapentin  600 mg Oral TID  . heparin injection (subcutaneous)  5,000 Units Subcutaneous Q8H  . insulin aspart  0-15 Units Subcutaneous TID WC  . multivitamin with minerals  1 tablet Oral Daily  . nicotine  14 mg Transdermal Daily  . pantoprazole  40 mg Oral Daily  . polyethylene glycol  17 g Oral BID  . senna-docusate  2 tablet Oral BID  . sodium chloride flush  3 mL Intravenous Q12H   Continuous Infusions: . sodium chloride    .  ceFAZolin (ANCEF) IV       LOS: 11 days    Time spent: Total of 25 minutes spent with pt, greater than 50% of which was spent in discussion of  treatment, counseling and coordination of care   Chipper Oman, MD Pager: Text Page via [email or web address]   If 7PM-7AM, please contact night-coverage [email or web address] 06/13/2017, 9:40 AM

## 2017-06-13 NOTE — Progress Notes (Signed)
OT Cancellation Note  Patient Details Name: Adrian Bean MRN: [id number] DOB: Jul 30, 1950   Cancelled Treatment:    Reason Eval/Treat Not Completed: Pain limiting ability to participate;Other (comment). Pt declined any OOB/EOB activity stating that he had 10/10 R UE and LE pain, is anxious and irritated that he is NPO as he is scheduled for surgery today. OT will check back on tomorrow as appropriate  [name] 06/13/2017, 10:57 AM

## 2017-06-13 NOTE — Op Note (Signed)
06/02/2017 - 06/13/2017  2:46 PM  PATIENT:  Adrian Bean    PRE-OPERATIVE DIAGNOSIS:  gangrene right forefoot  POST-OPERATIVE DIAGNOSIS:  Same  PROCEDURE:  RIGHT TRANSMETATARSAL AMPUTATION  SURGEON:  [name], MD  PHYSICIAN ASSISTANT:None ANESTHESIA:   General  PREOPERATIVE INDICATIONS:  [name] is a  66 y.o. male with a diagnosis of gangrene right forefoot who failed conservative measures and elected for surgical management.    The risks benefits and alternatives were discussed with the patient preoperatively including but not limited to the risks of infection, bleeding, nerve injury, cardiopulmonary complications, the need for revision surgery, among others, and the patient was willing to proceed.  OPERATIVE IMPLANTS: Praveena wound VAC.  OPERATIVE FINDINGS: Deep abscess plantarly tissue was sent for cultures.  OPERATIVE PROCEDURE: Patient was brought to the operating room and underwent a general anesthetic.  After adequate levels of anesthesia were obtained patient's right lower extremity was prepped using DuraPrep draped into a sterile field a timeout was called.  A fishmouth incision was made at the level of the metatarsal heads.  A transmetatarsal amputation was performed.  Patient had a large deep abscess plantarly.  The soft tissue was resected this was sent for cultures.  The wound was irrigated with normal saline the margins were clear after debridement.  A wound was left plantarly to allow for drainage.  The wound was closed using 2-0 nylon.  A Praveena plus wound VAC was applied this had a good suction fit.  Patient was extubated taken to the PACU in stable condition.   DISCHARGE PLANNING:  Antibiotic duration: Patient would continue IV antibiotics until tissue cultures are finalized and then would discharge on 4 weeks of oral antibiotics.  Weightbearing: Strict nonweightbearing on the right lower extremity.  Pain medication: As needed.  Dressing care/  Wound VAC: Continue the wound VAC for 1 week.  Ambulatory devices: Patient will need walker for nonweightbearing right lower extremity.  Evaluate for discharge to skilled nursing versus discharge  Discharge to: Anticipate discharge to skilled nursing.  Follow-up: In the office 1 week post operative.

## 2017-06-14 ENCOUNTER — Inpatient Hospital Stay (HOSPITAL_COMMUNITY): Payer: Medicare Other

## 2017-06-14 ENCOUNTER — Encounter (HOSPITAL_COMMUNITY): Payer: Self-pay | Admitting: Orthopedic Surgery

## 2017-06-14 LAB — BASIC METABOLIC PANEL
ANION GAP: 6 (ref 5–15)
BUN: 25 mg/dL — ABNORMAL HIGH (ref 6–20)
CALCIUM: 8.2 mg/dL — AB (ref 8.9–10.3)
CO2: 29 mmol/L (ref 22–32)
CREATININE: 1.37 mg/dL — AB (ref 0.61–1.24)
Chloride: 97 mmol/L — ABNORMAL LOW (ref 101–111)
GFR calc Af Amer: 60 mL/min — ABNORMAL LOW (ref >60)
GFR, EST NON AFRICAN AMERICAN: 51 mL/min — AB (ref >60)
GLUCOSE: 139 mg/dL — AB (ref 65–99)
Potassium: 4.4 mmol/L (ref 3.5–5.1)
Sodium: 132 mmol/L — ABNORMAL LOW (ref 135–145)

## 2017-06-14 LAB — GLUCOSE, CAPILLARY
GLUCOSE-CAPILLARY: 82 mg/dL (ref 65–99)
Glucose-Capillary: 168 mg/dL — ABNORMAL HIGH (ref 65–99)
Glucose-Capillary: 154 mg/dL — ABNORMAL HIGH (ref 65–99)
Glucose-Capillary: 145 mg/dL — ABNORMAL HIGH (ref 65–99)

## 2017-06-14 MED ORDER — MORPHINE SULFATE (PF) 4 MG/ML IV SOLN
4.0000 mg | INTRAVENOUS | Status: AC | PRN
  Administered 2017-06-14 – 2017-06-15 (×3): 4 mg via INTRAVENOUS
  Filled 2017-06-14 (×3): qty 1

## 2017-06-14 MED ORDER — DOXYCYCLINE HYCLATE 100 MG IV SOLR
100.0000 mg | Freq: Two times a day (BID) | INTRAVENOUS | Status: DC
  Administered 2017-06-14 – 2017-06-17 (×7): 100 mg via INTRAVENOUS
  Filled 2017-06-14 (×7): qty 100

## 2017-06-14 MED ORDER — FUROSEMIDE 10 MG/ML IJ SOLN
40.0000 mg | Freq: Every day | INTRAMUSCULAR | Status: DC
  Administered 2017-06-14 – 2017-06-15 (×2): 40 mg via INTRAVENOUS
  Filled 2017-06-14 (×3): qty 4

## 2017-06-14 MED ORDER — OXYCODONE-ACETAMINOPHEN 7.5-325 MG PO TABS
1.0000 | ORAL_TABLET | ORAL | Status: DC | PRN
  Administered 2017-06-14 – 2017-06-17 (×15): 1 via ORAL
  Filled 2017-06-14 (×15): qty 1

## 2017-06-14 MED ORDER — FUROSEMIDE 10 MG/ML IJ SOLN: 20.0000 mg | Freq: Once | INTRAMUSCULAR | Status: DC

## 2017-06-14 NOTE — Plan of Care (Signed)
Goals established

## 2017-06-14 NOTE — Plan of Care (Signed)
  Progressing Health Behavior/Discharge Planning: Ability to manage health-related needs will improve 06/14/2017 0141 - Progressing by Blair Promise, RN Clinical Measurements: Ability to maintain clinical measurements within normal limits will improve 06/14/2017 0141 - Progressing by Blair Promise, RN Will remain free from infection 06/14/2017 0141 - Progressing by Blair Promise, RN Diagnostic test results will improve 06/14/2017 0141 - Progressing by Blair Promise, RN Respiratory complications will improve 06/14/2017 0141 - Progressing by Blair Promise, RN Cardiovascular complication will be avoided 06/14/2017 0141 - Progressing by Blair Promise, RN Activity: Risk for activity intolerance will decrease 06/14/2017 0141 - Progressing by Blair Promise, RN Coping: Level of anxiety will decrease 06/14/2017 0141 - Progressing by Blair Promise, RN Elimination: Will not experience complications related to bowel motility 06/14/2017 0141 - Progressing by Blair Promise, RN Will not experience complications related to urinary retention 06/14/2017 0141 - Progressing by Blair Promise, RN Pain Managment: General experience of comfort will improve 06/14/2017 0141 - Progressing by Blair Promise, RN Safety: Ability to remain free from injury will improve 06/14/2017 0141 - Progressing by Blair Promise, RN Skin Integrity: Risk for impaired skin integrity will decrease 06/14/2017 0141 - Progressing by Blair Promise, RN Education: Knowledge of prescribed regimen will improve 06/14/2017 0141 - Progressing by Blair Promise, RN Activity: Ability to tolerate increased activity will improve 06/14/2017 0141 - Progressing by Blair Promise, RN Bowel/Gastric: Gastrointestinal status for postoperative course will improve 06/14/2017 0141 - Progressing by Blair Promise, RN Clinical Measurements: Postoperative complications will be avoided or minimized 06/14/2017 0141 - Progressing by Blair Promise, RN Signs and symptoms of graft occlusion will improve 06/14/2017 0141 - Progressing by Blair Promise, RN Skin Integrity: Demonstration of wound healing without infection will improve 06/14/2017 0141 - Progressing by Blair Promise, RN Education: Knowledge of the prescribed therapeutic regimen will improve 06/14/2017 0141 - Progressing by Blair Promise, RN Ability to verbalize activity precautions or restrictions will improve 06/14/2017 0141 - Progressing by Blair Promise, RN Understanding of discharge needs will improve 06/14/2017 0141 - Progressing by Blair Promise, RN Self-Care: Ability to meet self-care needs will improve 06/14/2017 0141 - Progressing by Blair Promise, RN Skin Integrity: Demonstration of wound healing without infection will improve 06/14/2017 0141 - Progressing by Blair Promise, RN

## 2017-06-14 NOTE — Progress Notes (Signed)
PROGRESS NOTE [facility]   Adrian Bean   WUJ:[id number] DOB: [birth year]  DOA: 06/02/2017 PCP: [name], MD   Brief Narrative:  Adrian Bean is a 66 y/o M with with PMH of type II DM, neuropathy, PAD, left foot amputation, tobacco abuse; admitted on 06/02/2017, presented with complaint of right leg wound on third toe, was found to have dry gangrene.  Initially admitted at [facility] for cellulitis and gangrene and started on IV antibiotics. CTA showed extensive bilateral PVD and patient was transferred to [facility] after discussion with vascular surgery.  Underwent bilateral angiogram and underwent for aortofemoral popliteal bypass and R 3rd toe amputation. Patient developed atelectasis and was placed on O2 supplementation. Follow up post op, patient with increase in WBC and R foot erythema. MRI does not show osteomyelitis. S/p TMA     Subjective: Had TMA yesterday, pain is well controlled. No acute events.   Assessment & Plan: 1. Right third toe gangrene.   S/P amputation of the right third toe.  06/04/2017 Right femoral to popliteal bypass.  06/04/2017 Peripheral vascular disease. Sepsis secondary to cellulitis. 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 MRSA PCR is negative. Initially on IV vancomycin and Zosyn. Switch to Unasyn and stopped on 06/06/2017. Due to leukocytosis vascular surgery resumed antibiotics. Patient currently on IV doxy  MRI of the R foot does not shows osteo or abscess, there is fluid collection at the base of his third metatarsal in the arch of the foot. Orthopedic surgery appreciated    2. Type 2 diabetes mellitus. Hemoglobin A1c 6.3. Diabetic neuropathy. CBG's continues to be stable  Holding home oral hypoglycemics Continue current management   3. Tobacco abuse  Smoking cessation discussed    4.  Hepatic flexure of the colon thickening. Incidentally noted on the CT scan. Will require outpatient GI follow-up with colonoscopy.  5.  Acute hypoxic respiratory failure. - pleural effusion with suspected pneumonia - improving  Wean O2 as tolerated to RA, patient does not use O2 at baseline. Vascular congestion noted on chest x-ray 12/9, repeated CXR 12/13 shows stable moderate R pleural effusion with atelectasis vs infiltrates - patient on Zosyn will switch to doxy  PNA - patient with cough, increase WBC and hypoxic. Continue doxy for now.  Continue Lasix IV daily  Repeated CXR shows suspected moderate to large pleural effusion in the Right side  Will get CT chest, may need thoracentesis  OOB as tolerated    6.  Fever - Resolved  Postoperative fever most likely atelectasis versus effective anesthesia. Already on antibiotic.  7.  Acute kidney injury. - Resolved  Patient was given lasix due to hypoalbuminemia  Check BMP in AM while on diuresis   DVT prophylaxis: Heparin sq Code Status: Full  Family Communication: Non at bedside  Disposition Plan: SNF in 1-2 days    Consultants:   Vascular surgery   Procedures:  S/P amputation of the right third toe.  06/04/2017 Right femoral to popliteal bypass.  06/04/2017  Antimicrobials: Anti-infectives (From admission, onward)   Start     Dose/Rate Route Frequency Ordered Stop   06/14/17 1000  doxycycline (VIBRAMYCIN) 100 mg in dextrose 5 % 250 mL IVPB     100 mg 125 mL/hr over 120 Minutes Intravenous Every 12 hours 06/14/17 0743     06/13/17 0800  ceFAZolin (ANCEF) IVPB 2g/100 mL premix  2 g 200 mL/hr over 30 Minutes Intravenous To [facility] 06/12/17 2108 06/13/17 1433   06/12/17 2200  doxycycline (VIBRA-TABS) tablet 100 mg  Status:  Discontinued     100 mg Oral Every 12 hours 06/12/17 1623 06/14/17 0743   06/08/17 0830  ampicillin-sulbactam (UNASYN) 1.5 g in sodium chloride 0.9 % 50 mL IVPB  Status:   Discontinued     1.5 g 100 mL/hr over 30 Minutes Intravenous Every 6 hours 06/08/17 0716 06/12/17 1623   06/05/17 1130  ampicillin-sulbactam (UNASYN) 1.5 g in sodium chloride 0.9 % 50 mL IVPB  Status:  Discontinued     1.5 g 100 mL/hr over 30 Minutes Intravenous Every 6 hours 06/05/17 0841 06/06/17 1308   06/03/17 0800  ceFAZolin (ANCEF) IVPB 1 g/50 mL premix     1 g 100 mL/hr over 30 Minutes Intravenous On call 06/02/17 1704 06/04/17 0800   06/03/17 0600  vancomycin (VANCOCIN) IVPB 1000 mg/200 mL premix  Status:  Discontinued     1,000 mg 200 mL/hr over 60 Minutes Intravenous Every 12 hours 06/02/17 1917 06/05/17 0756   06/02/17 2000  piperacillin-tazobactam (ZOSYN) IVPB 3.375 g  Status:  Discontinued     3.375 g 12.5 mL/hr over 240 Minutes Intravenous Every 8 hours 06/02/17 1912 06/05/17 0757         Objective: Vitals:   06/14/17 0302 06/14/17 0513 06/14/17 0800 06/14/17 1423  BP:   117/76 100/69  Pulse: 89   70  Resp:   19 18  Temp: 98.7 F (37.1 C)  98.5 F (36.9 C) 98.1 F (36.7 C)  TempSrc: Oral  Oral Oral  SpO2:   92% 99%  Weight:  86.1 kg (189 lb 14.4 oz)    Height:        Intake/Output Summary (Last 24 hours) at 06/14/2017 1726 Last data filed at 06/14/2017 1700 Gross per 24 hour  Intake 1287 ml  Output 860 ml  Net 427 ml   Filed Weights   06/04/17 1008 06/13/17 1234 06/14/17 0513  Weight: 80.3 kg (177 lb) 80.3 kg (177 lb) 86.1 kg (189 lb 14.4 oz)    Examination:   General: NAD Cardiovascular: RRR S1s2 Respiratory: Decrease breath sounds in the R up to mid fields, bibasilar crackles  Abdominal: Soft NTND  Extremities: R TMA with wound vac in place    Data Reviewed: I have personally reviewed following labs and imaging studies  CBC: Recent Labs  Lab 06/09/17 0230 06/10/17 0443 06/11/17 0252 06/12/17 0310 06/13/17 0740  WBC 12.9* 12.9* 14.2* 13.9* 11.3*  NEUTROABS  --   --  10.0* 11.1* 8.4*  HGB 9.9* 9.6* 9.5* 9.2* 9.2*  HCT 31.0* 30.3*  30.1* 29.5* 28.9*  MCV 94.8 97.1 95.9 95.8 97.0  PLT 365 386 415* 396 093   Basic Metabolic Panel: Recent Labs  Lab 06/09/17 0230 06/10/17 0443 06/11/17 0252 06/12/17 0310 06/13/17 0740  NA 139 138 138 137 136  K 4.5 4.3 4.4 4.3 4.2  CL 101 101 102 101 100*  CO2 30 29 30 29 30   GLUCOSE 147* 191* 146* 151* 152*  BUN 36* 31* 27* 26* 24*  CREATININE 1.29* 1.31* 1.17 1.15 1.16  CALCIUM 8.9 9.0 8.7* 8.6* 8.5*   GFR: Estimated Creatinine Clearance: 66.9 mL/min (by C-G formula based on SCr of 1.16 mg/dL). Liver Function Tests: Recent Labs  Lab 06/12/17 0310  ALBUMIN 1.8*   No results for input(s): LIPASE, AMYLASE in the last 168 hours. No results for input(s):  AMMONIA in the last 168 hours. Coagulation Profile: No results for input(s): INR, PROTIME in the last 168 hours. Cardiac Enzymes: No results for input(s): CKTOTAL, CKMB, CKMBINDEX, TROPONINI in the last 168 hours. BNP (last 3 results) No results for input(s): PROBNP in the last 8760 hours. HbA1C: No results for input(s): HGBA1C in the last 72 hours. CBG: Recent Labs  Lab 06/13/17 1850 06/13/17 2213 06/14/17 0641 06/14/17 1133 06/14/17 1648  GLUCAP 156* 135* 168* 154* 82   Lipid Profile: No results for input(s): CHOL, HDL, LDLCALC, TRIG, CHOLHDL, LDLDIRECT in the last 72 hours. Thyroid Function Tests: No results for input(s): TSH, T4TOTAL, FREET4, T3FREE, THYROIDAB in the last 72 hours. Anemia Panel: No results for input(s): VITAMINB12, FOLATE, FERRITIN, TIBC, IRON, RETICCTPCT in the last 72 hours. Sepsis Labs: No results for input(s): PROCALCITON, LATICACIDVEN in the last 168 hours.  Recent Results (from the past 240 hour(s))  Aerobic/Anaerobic Culture (surgical/deep wound)     Status: None (Preliminary result)   Collection Time: 06/13/17  2:38 PM  Result Value Ref Range Status   Specimen Description ABSCESS RIGHT FOOT  Final   Special Requests NONE  Final   Gram Stain   Final    FEW WBC PRESENT,BOTH  PMN AND MONONUCLEAR NO ORGANISMS SEEN    Culture NO GROWTH 1 DAY  Final   Report Status PENDING  Incomplete      Radiology Studies: Dg Chest Port 1 View  Result Date: 06/14/2017 CLINICAL DATA:  66 y/o male with cough and shortness of breath. Pleural effusion. EXAM: PORTABLE CHEST 1 VIEW COMPARISON:  06/12/2017 and earlier. FINDINGS: Portable AP semi upright view at 0733 hours. Continued moderate to large veiling opacity in the right lower lung. No superimposed pneumothorax. Pulmonary vascularity appears stable and within normal limits. Stable visible mediastinal contours. No other confluent pulmonary opacity. Partially visible cervical ACDF hardware. IMPRESSION: 1. Moderate to large right pleural effusion suspected and stable. 2. No new cardiopulmonary abnormality. Electronically Signed   By: Genevie Ann M.D.   On: 06/14/2017 08:00      Scheduled Meds: . aspirin EC  81 mg Oral Daily  . atorvastatin  10 mg Oral q1800  . collagenase   Topical Daily  . docusate sodium  100 mg Oral BID  . feeding supplement (GLUCERNA SHAKE)  237 mL Oral TID BM  . feeding supplement (PRO-STAT SUGAR FREE 64)  30 mL Oral BID  . gabapentin  600 mg Oral TID  . heparin injection (subcutaneous)  5,000 Units Subcutaneous Q8H  . insulin aspart  0-15 Units Subcutaneous TID WC  . multivitamin with minerals  1 tablet Oral Daily  . nicotine  14 mg Transdermal Daily  . pantoprazole  40 mg Oral Daily  . polyethylene glycol  17 g Oral BID  . senna-docusate  2 tablet Oral BID  . sodium chloride flush  3 mL Intravenous Q12H   Continuous Infusions: . sodium chloride    . sodium chloride Stopped (06/14/17 1310)  . doxycycline (VIBRAMYCIN) IV Stopped (06/14/17 1230)  . methocarbamol (ROBAXIN)  IV       LOS: 12 days    Time spent: Total of 25 minutes spent with pt, greater than 50% of which was spent in discussion of  treatment, counseling and coordination of care   Chipper Oman, MD Pager: Text Page via  [email or web address]   If 7PM-7AM, please contact night-coverage [email or web address] 06/14/2017, 5:26 PM

## 2017-06-14 NOTE — Evaluation (Signed)
Physical Therapy Re-Evaluation Patient Details Name: Adrian Bean MRN: [id number] DOB: 1951-06-25 Today's Date: 06/14/2017   History of Present Illness  Seen initially post-op R femoral to AK popliteal BPG and 3rd toe amputation. Re-evaluated 12/15 after Rt transmetatarsal amputation on 12/14. PMH: HTN, DM, PVD with L toe amputations, renal disease, current smoker.  Clinical Impression  Patient is s/p Rt transtarsal amputation resulting in functional limitations due to the deficits listed below (see PT Problem List). Demonstrates significant difficulty with mobility and inability to maintain NWB on RLE without physical assistance. Pt lives alone and will not be able to safely care for himself at home. Recommend SNF until independent.  Patient will benefit from skilled PT to increase their independence and safety with mobility to allow discharge to the venue listed below.       Follow Up Recommendations SNF    Equipment Recommendations  (TBD next venue of care)    Recommendations for Other Services OT consult     Precautions / Restrictions Precautions Precautions: Fall Restrictions Weight Bearing Restrictions: Yes RLE Weight Bearing: Non weight bearing Other Position/Activity Restrictions: Strict NON-Weight bearing Rt foot      Mobility  Bed Mobility Overal bed mobility: Needs Assistance Bed Mobility: Supine to Sit     Supine to sit: Min assist     General bed mobility comments: Able to rise to EOB however slightly dizzy and required min assist for balance until fully resolved about 10 seconds. Cues for safety.  Transfers Overall transfer level: Needs assistance Equipment used: Rolling walker (2 wheeled) Transfers: Sit to/from [facility] Sit to Stand: Min assist Stand pivot transfers: Mod assist       General transfer comment: Min assist for boost with cues to keep foot NWB on Rt. Mod assist to maintain NWB on Rt with pivot to chair. Max cues and  education for UE [place] with RW to prevent Rt foot from touching. Pt with poor ability to use UEs fully for support. May require squat pivot transfer in future.  Ambulation/Gait             General Gait Details: Not safe due to inability to maintain NWB Rt  Stairs            Wheelchair Mobility    Modified Rankin (Stroke Patients Only)       Balance Overall balance assessment: Needs assistance   Sitting balance-Leahy Scale: Good     Standing balance support: Bilateral upper extremity supported Standing balance-Leahy Scale: Poor                               Pertinent Vitals/Pain Pain Assessment: 0-10 Pain Score: 10-Worst pain ever Pain Location: R LE Pain Descriptors / Indicators: Sore Pain Intervention(s): Premedicated before session;Utilized relaxation techniques    Home Living Family/patient expects to be discharged to:: Private residence Living Arrangements: Alone   Type of Home: Apartment Home Access: Stairs to enter Entrance Stairs-Rails: Right Entrance Stairs-Number of Steps: flight Home Layout: One level Home Equipment: Environmental consultant - 4 wheels      Prior Function Level of Independence: Independent with assistive device(s)               Hand Dominance   Dominant Hand: Right    Extremity/Trunk Assessment   Upper Extremity Assessment Upper Extremity Assessment: Defer to OT evaluation    Lower Extremity Assessment Lower Extremity Assessment: Generalized weakness;RLE deficits/detail RLE Deficits / Details: bandaged  Rt residual foot and wound vac in place       Communication   Communication: No difficulties  Cognition Arousal/Alertness: Awake/alert Behavior During Therapy: Flat affect;Impulsive Overall Cognitive Status: Impaired/Different from baseline Area of Impairment: Safety/judgement                                      General Comments General comments (skin integrity, edema, etc.): Required 4L  suppllemental O2 to maintain sats> 90%, droped to high 80s on 2L    Exercises General Exercises - Lower Extremity Ankle Circles/Pumps: AROM;Both;10 reps;Seated Quad Sets: Strengthening;Both;10 reps;Seated Gluteal Sets: Strengthening;Both;10 reps;Seated   Assessment/Plan    PT Assessment Patient needs continued PT services  PT Problem List Decreased strength;Decreased activity tolerance;Decreased balance;Decreased mobility;Decreased knowledge of use of DME;Decreased safety awareness;Pain;Decreased knowledge of precautions       PT Treatment Interventions DME instruction;Gait training;Stair training;Functional mobility training;Therapeutic activities;Therapeutic exercise;Balance training;Patient/family education;Modalities;Wheelchair mobility training    PT Goals (Current goals can be found in the Care Plan section)  Acute Rehab PT Goals Patient Stated Goal: Get better before going home PT Goal Formulation: With patient Time For Goal Achievement: 06/28/17 Potential to Achieve Goals: Good    Frequency Min 3X/week   Barriers to discharge Decreased caregiver support      Co-evaluation               AM-PAC PT "6 Clicks" Daily Activity  Outcome Measure Difficulty turning over in bed (including adjusting bedclothes, sheets and blankets)?: None Difficulty moving from lying on back to sitting on the side of the bed? : A Little Difficulty sitting down on and standing up from a chair with arms (e.g., wheelchair, bedside commode, etc,.)?: A Lot Help needed moving to and from a bed to chair (including a wheelchair)?: A Lot Help needed walking in hospital room?: Total Help needed climbing 3-5 steps with a railing? : Total 6 Click Score: 13    End of Session Equipment Utilized During Treatment: Gait belt;Oxygen Activity Tolerance: Patient limited by fatigue Patient left: in chair;with call bell/phone within reach;with nursing/sitter in room Nurse Communication: Mobility  status;Weight bearing status PT Visit Diagnosis: Muscle weakness (generalized) (M62.81);Difficulty in walking, not elsewhere classified (R26.2);Pain Pain - Right/Left: Right Pain - part of body: Leg    Time: 1139-1207 PT Time Calculation (min) (ACUTE ONLY): 28 min   Charges:   PT Evaluation $PT Re-evaluation: 1 Re-eval PT Treatments $Therapeutic Activity: 8-22 mins   PT G Codes:        Elayne Snare, [place]  Ellouise Newer 06/14/2017, 12:53 PM

## 2017-06-14 NOTE — Progress Notes (Addendum)
  Progress Note    06/14/2017 9:30 AM 1 Day Post-Op  Subjective: Patient is status post right TMA.  Per patient pain management is not adequate.   Vitals:   06/14/17 0302 06/14/17 0800  BP:  117/76  Pulse: 89   Resp:  19  Temp: 98.7 F (37.1 C) 98.5 F (36.9 C)  SpO2:  92%   Physical Exam: Cardiac:  RRR Lungs:  No inc resp effort Incisions: Incisions of right groin and right medial thigh stable without palpable hematoma; no drainage or obvious signs of infection; skin edges approximated well; palpable right AT; wound VAC left in place right foot Abdomen: Soft Neurologic: Moving all extremities well  CBC    Component Value Date/Time   WBC 11.3 (H) 06/13/2017 0740   RBC 2.98 (L) 06/13/2017 0740   HGB 9.2 (L) 06/13/2017 0740   HCT 28.9 (L) 06/13/2017 0740   PLT 388 06/13/2017 0740   MCV 97.0 06/13/2017 0740   MCH 30.9 06/13/2017 0740   MCHC 31.8 06/13/2017 0740   RDW 13.1 06/13/2017 0740   LYMPHSABS 1.6 06/13/2017 0740   MONOABS 0.9 06/13/2017 0740   EOSABS 0.4 06/13/2017 0740   BASOSABS 0.1 06/13/2017 0740    BMET    Component Value Date/Time   NA 136 06/13/2017 0740   K 4.2 06/13/2017 0740   CL 100 (L) 06/13/2017 0740   CO2 30 06/13/2017 0740   GLUCOSE 152 (H) 06/13/2017 0740   BUN 24 (H) 06/13/2017 0740   CREATININE 1.16 06/13/2017 0740   CALCIUM 8.5 (L) 06/13/2017 0740   GFRNONAA >60 06/13/2017 0740   GFRAA >60 06/13/2017 0740    INR    Component Value Date/Time   INR 1.25 06/04/2017 0332     Intake/Output Summary (Last 24 hours) at 06/14/2017 0930 Last data filed at 06/13/2017 2225 Gross per 24 hour  Intake 1303 ml  Output 1070 ml  Net 233 ml     Assessment/Plan:  66 y.o. male is s/p right femoral to AK popliteal artery bypass with GS V conduit; subsequent right TMA by Dr. Sharol Given 1 Day Post-Op   Pain management adjusted; morphine added for breakthrough pain Continue to monitor peripheral signals Continue wound VAC right foot for 1 week  with nonweightbearing status right foot per Dr. Sharol Given  DVT prophylaxis:  subq heparin   Dagoberto Ligas, PA-C [facility] [phone] 06/14/2017 9:30 AM

## 2017-06-15 LAB — GLUCOSE, CAPILLARY
GLUCOSE-CAPILLARY: 109 mg/dL — AB (ref 65–99)
GLUCOSE-CAPILLARY: 137 mg/dL — AB (ref 65–99)
Glucose-Capillary: 144 mg/dL — ABNORMAL HIGH (ref 65–99)
Glucose-Capillary: 166 mg/dL — ABNORMAL HIGH (ref 65–99)

## 2017-06-15 NOTE — Progress Notes (Signed)
PROGRESS NOTE [facility]   Depaul Salem   TIW:[id number] DOB: Aug 01, 1950  DOA: 06/02/2017 PCP: [name], MD   Brief Narrative:  Adrian Bean is a 66 y/o M with with PMH of type II DM, neuropathy, PAD, left foot amputation, tobacco abuse; admitted on 06/02/2017, presented with complaint of right leg wound on third toe, was found to have dry gangrene.  Initially admitted at [facility] for cellulitis and gangrene and started on IV antibiotics. CTA showed extensive bilateral PVD and patient was transferred to [facility] after discussion with vascular surgery.  Underwent bilateral angiogram and underwent for aortofemoral popliteal bypass and R 3rd toe amputation. Patient developed atelectasis and was placed on O2 supplementation. Follow up post op, patient with increase in WBC and R foot erythema. MRI does not show osteomyelitis. S/p TMA     Subjective: Patient continue to do well. Remains in O2 supplementations   Assessment & Plan: 1. Right third toe gangrene.   S/P amputation of the right third toe.  06/04/2017 Right femoral to popliteal bypass.  06/04/2017 Peripheral vascular disease. Sepsis secondary to cellulitis. 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 MRSA PCR is negative. Initially on IV vancomycin and Zosyn. Switch to Unasyn and stopped on 06/06/2017. Due to leukocytosis vascular surgery resumed antibiotics. Patient currently on IV doxy, will continue abx for 4 weeks.   2. Type 2 diabetes mellitus. Hemoglobin A1c 6.3. Diabetic neuropathy. CBG's continues to be stable  Holding home oral hypoglycemics Continue current management   3. Tobacco abuse  Smoking cessation discussed   4.  Hepatic flexure of the colon thickening. Incidentally noted on the CT scan. Will require outpatient GI follow-up with  colonoscopy.  5.  Acute hypoxic respiratory failure. - pleural effusion with suspected pneumonia - improving  Wean O2 as tolerated to RA, patient does not use O2 at baseline. Vascular congestion noted on chest x-ray 12/9, repeated CXR 12/13 shows stable moderate R pleural effusion with atelectasis vs infiltrates - patient was on Unasyn - switched to Doxy will continue for now  PNA - patient with cough, increase WBC and hypoxic.  D/c Lasix due to increase in Cr  Repeated CXR shows suspected moderate to large pleural effusion in the Right side  CT shows Consolidation with air bronchogram in R middle and lowe lobe also left lower lobe consolidation with air bronchogram and pleural effusion.  Continue abx therapy, OOB as tolerated, incentive spirometry. Will order thoracentesis.    6.  Acute kidney injury. - initially improved but Cr raise again  Due to diuresis  D/c Lasix  Monitor for now   DVT prophylaxis: Heparin sq Code Status: Full  Family Communication: Non at bedside  Disposition Plan: SNF   Consultants:   Vascular surgery   Procedures:  S/P amputation of the right third toe.  06/04/2017 Right femoral to popliteal bypass.  06/04/2017  Antimicrobials: Anti-infectives (From admission, onward)   Start     Dose/Rate Route Frequency Ordered Stop   06/14/17 1000  doxycycline (VIBRAMYCIN) 100 mg in dextrose 5 % 250 mL IVPB     100 mg 125 mL/hr over 120 Minutes Intravenous Every 12 hours 06/14/17 0743     06/13/17 0800  ceFAZolin (ANCEF) IVPB 2g/100 mL premix     2 g 200 mL/hr over 30 Minutes Intravenous To [facility] 06/12/17 2108 06/13/17 1433   06/12/17 2200  doxycycline (VIBRA-TABS) tablet 100 mg  Status:  Discontinued     100 mg Oral Every 12 hours 06/12/17 1623 06/14/17 0743   06/08/17 0830  ampicillin-sulbactam (UNASYN) 1.5 g in sodium chloride 0.9 % 50 mL IVPB  Status:  Discontinued     1.5 g 100 mL/hr over 30 Minutes Intravenous Every 6 hours 06/08/17 0716  06/12/17 1623   06/05/17 1130  ampicillin-sulbactam (UNASYN) 1.5 g in sodium chloride 0.9 % 50 mL IVPB  Status:  Discontinued     1.5 g 100 mL/hr over 30 Minutes Intravenous Every 6 hours 06/05/17 0841 06/06/17 1308   06/03/17 0800  ceFAZolin (ANCEF) IVPB 1 g/50 mL premix     1 g 100 mL/hr over 30 Minutes Intravenous On call 06/02/17 1704 06/04/17 0800   06/03/17 0600  vancomycin (VANCOCIN) IVPB 1000 mg/200 mL premix  Status:  Discontinued     1,000 mg 200 mL/hr over 60 Minutes Intravenous Every 12 hours 06/02/17 1917 06/05/17 0756   06/02/17 2000  piperacillin-tazobactam (ZOSYN) IVPB 3.375 g  Status:  Discontinued     3.375 g 12.5 mL/hr over 240 Minutes Intravenous Every 8 hours 06/02/17 1912 06/05/17 0757         Objective: Vitals:   06/15/17 0836 06/15/17 1200 06/15/17 1600 06/15/17 1721  BP: 128/75 130/85 116/70   Pulse:      Resp: (!) 23 19 15    Temp: 98.4 F (36.9 C)   98.4 F (36.9 C)  TempSrc: Oral  Oral Oral  SpO2: 100% 99% 99%   Weight:      Height:        Intake/Output Summary (Last 24 hours) at 06/15/2017 1744 Last data filed at 06/15/2017 1728 Gross per 24 hour  Intake 973 ml  Output 2120 ml  Net -1147 ml   Filed Weights   06/04/17 1008 06/13/17 1234 06/14/17 0513  Weight: 80.3 kg (177 lb) 80.3 kg (177 lb) 86.1 kg (189 lb 14.4 oz)    Examination:   General: NAD Cardiovascular: RRR S1s2 Respiratory: Decrease breath sounds in the R up to mid fields, bibasilar crackles  Abdominal: Soft NTND  Extremities: R TMA with wound vac in place    Data Reviewed: I have personally reviewed following labs and imaging studies  CBC: Recent Labs  Lab 06/09/17 0230 06/10/17 0443 06/11/17 0252 06/12/17 0310 06/13/17 0740  WBC 12.9* 12.9* 14.2* 13.9* 11.3*  NEUTROABS  --   --  10.0* 11.1* 8.4*  HGB 9.9* 9.6* 9.5* 9.2* 9.2*  HCT 31.0* 30.3* 30.1* 29.5* 28.9*  MCV 94.8 97.1 95.9 95.8 97.0  PLT 365 386 415* 396 607   Basic Metabolic Panel: Recent Labs   Lab 06/10/17 0443 06/11/17 0252 06/12/17 0310 06/13/17 0740 06/14/17 1832  NA 138 138 137 136 132*  K 4.3 4.4 4.3 4.2 4.4  CL 101 102 101 100* 97*  CO2 29 30 29 30 29   GLUCOSE 191* 146* 151* 152* 139*  BUN 31* 27* 26* 24* 25*  CREATININE 1.31* 1.17 1.15 1.16 1.37*  CALCIUM 9.0 8.7* 8.6* 8.5* 8.2*   GFR: Estimated Creatinine Clearance: 56.6 mL/min (A) (by C-G formula based on SCr of 1.37 mg/dL (H)). Liver Function Tests: Recent Labs  Lab 06/12/17 0310  ALBUMIN 1.8*   No results for input(s): LIPASE, AMYLASE in the last 168 hours. No results for input(s): AMMONIA in the last 168 hours. Coagulation Profile: No results for input(s): INR, PROTIME in the last 168 hours. Cardiac Enzymes: No results for input(s):  CKTOTAL, CKMB, CKMBINDEX, TROPONINI in the last 168 hours. BNP (last 3 results) No results for input(s): PROBNP in the last 8760 hours. HbA1C: No results for input(s): HGBA1C in the last 72 hours. CBG: Recent Labs  Lab 06/14/17 1648 06/14/17 2112 06/15/17 0559 06/15/17 1105 06/15/17 1702  GLUCAP 82 145* 144* 166* 109*   Lipid Profile: No results for input(s): CHOL, HDL, LDLCALC, TRIG, CHOLHDL, LDLDIRECT in the last 72 hours. Thyroid Function Tests: No results for input(s): TSH, T4TOTAL, FREET4, T3FREE, THYROIDAB in the last 72 hours. Anemia Panel: No results for input(s): VITAMINB12, FOLATE, FERRITIN, TIBC, IRON, RETICCTPCT in the last 72 hours. Sepsis Labs: No results for input(s): PROCALCITON, LATICACIDVEN in the last 168 hours.  Recent Results (from the past 240 hour(s))  Aerobic/Anaerobic Culture (surgical/deep wound)     Status: None (Preliminary result)   Collection Time: 06/13/17  2:38 PM  Result Value Ref Range Status   Specimen Description ABSCESS RIGHT FOOT  Final   Special Requests NONE  Final   Gram Stain   Final    FEW WBC PRESENT,BOTH PMN AND MONONUCLEAR NO ORGANISMS SEEN    Culture   Final    NO GROWTH 2 DAYS NO ANAEROBES ISOLATED;  CULTURE IN PROGRESS FOR 5 DAYS   Report Status PENDING  Incomplete      Radiology Studies: Ct Chest Wo Contrast  Addendum Date: 06/14/2017   ADDENDUM REPORT: 06/14/2017 23:22 ADDENDUM: Addendum created to add an additional impression. IMPRESSION #7: Right thyroid nodule measuring 2.1 cm. Recommend further evaluation with thyroid ultrasound. Electronically Signed   By: Jeb Levering M.D.   On: 06/14/2017 23:22   Result Date: 06/14/2017 CLINICAL DATA:  Follow-up pleural effusion. EXAM: CT CHEST WITHOUT CONTRAST TECHNIQUE: Multidetector CT imaging of the chest was performed following the standard protocol without IV contrast. COMPARISON:  Radiographs earlier this day. Additional prior exams. Chest CT 07/03/2011 FINDINGS: Cardiovascular: Moderate multi chamber cardiomegaly. Coronary artery calcifications versus stents. Tortuous atherosclerotic aorta with moderate atherosclerosis. Ascending aorta measures 4.1 cm greatest dimension, unchanged from exam 5 years prior. Small volume pericardial fluid. Mediastinum/Nodes: Multiple small subcentimeter mediastinal nodes. Subcarinal node measures 15 mm short axis. Suspect prominent right hilar nodes, suboptimally assessed without IV contrast. The esophagus is decompressed. There is a 2.1 cm right thyroid nodule. Lungs/Pleura: Moderate emphysema. Near complete consolidation of the right lower lobe with air bronchograms. Consolidation throughout majority of the right middle lobe with air bronchograms. Small partially loculated right pleural effusion measures simple fluid density. Pleural fluid tracks in the fissure. Small nonspecific ground-glass opacity in the right upper lobe, image 83 series 4. Minimal debris/secretions in the right mainstem bronchus and bronchus intermedius. Dependent left lower lobe consolidation with air bronchograms. Small left pleural effusion. Upper Abdomen: Postcholecystectomy. No evidence of acute abnormality. Musculoskeletal: Degenerative  change in the spine. There are no acute or suspicious osseous abnormalities. IMPRESSION: 1. Consolidation with air bronchograms throughout right middle and right lower lobe. This may be airspace filling process such as pneumonia versus atelectasis. There is mild volume loss in the right lung favoring atelectasis. Underlying neoplastic etiology is not entirely excluded, no central obstructing lesion is visualized. Small partially loculated pleural effusion. 2. Left lower lobe consolidation with air bronchograms and adjacent small pleural effusions, similar appearance to process on the right but to a lesser extent. 3. Prominent subcarinal node and suspected right hilar adenopathy, likely reactive but nonspecific. 4. Moderate emphysema. 5. Aortic atherosclerosis with minimal aneurysmal dilatation of the ascending aorta measuring 4.1 cm.  Recommend annual imaging followup by CTA or MRA. This recommendation follows 2010 ACCF/AHA/AATS/ACR/ASA/SCA/SCAI/SIR/STS/SVM Guidelines for the Diagnosis and Management of Patients with Thoracic Aortic Disease. Circulation. 2010; 121: e266-e369 6. Cardiomegaly and coronary artery calcifications. Aortic Atherosclerosis (ICD10-I70.0) and Emphysema (ICD10-J43.9). Aortic aneurysm NOS (ICD10-I71.9). Electronically Signed: By: Jeb Levering M.D. On: 06/14/2017 21:51   Dg Chest Port 1 View  Result Date: 06/14/2017 CLINICAL DATA:  67 y/o male with cough and shortness of breath. Pleural effusion. EXAM: PORTABLE CHEST 1 VIEW COMPARISON:  06/12/2017 and earlier. FINDINGS: Portable AP semi upright view at 0733 hours. Continued moderate to large veiling opacity in the right lower lung. No superimposed pneumothorax. Pulmonary vascularity appears stable and within normal limits. Stable visible mediastinal contours. No other confluent pulmonary opacity. Partially visible cervical ACDF hardware. IMPRESSION: 1. Moderate to large right pleural effusion suspected and stable. 2. No new  cardiopulmonary abnormality. Electronically Signed   By: Genevie Ann M.D.   On: 06/14/2017 08:00      Scheduled Meds: . aspirin EC  81 mg Oral Daily  . atorvastatin  10 mg Oral q1800  . collagenase   Topical Daily  . docusate sodium  100 mg Oral BID  . feeding supplement (GLUCERNA SHAKE)  237 mL Oral TID BM  . feeding supplement (PRO-STAT SUGAR FREE 64)  30 mL Oral BID  . furosemide  40 mg Intravenous Daily  . gabapentin  600 mg Oral TID  . heparin injection (subcutaneous)  5,000 Units Subcutaneous Q8H  . insulin aspart  0-15 Units Subcutaneous TID WC  . multivitamin with minerals  1 tablet Oral Daily  . nicotine  14 mg Transdermal Daily  . pantoprazole  40 mg Oral Daily  . polyethylene glycol  17 g Oral BID  . senna-docusate  2 tablet Oral BID  . sodium chloride flush  3 mL Intravenous Q12H   Continuous Infusions: . sodium chloride    . sodium chloride Stopped (06/14/17 1310)  . doxycycline (VIBRAMYCIN) IV Stopped (06/15/17 1201)  . methocarbamol (ROBAXIN)  IV       LOS: 13 days   Time spent: Total of 25 minutes spent with pt, greater than 50% of which was spent in discussion of  treatment, counseling and coordination of care  Chipper Oman, MD Pager: Text Page via [email or web address]   If 7PM-7AM, please contact night-coverage [email or web address] 06/15/2017, 5:44 PM

## 2017-06-15 NOTE — Progress Notes (Signed)
Occupational Therapy Re-evaluation and Treatment Patient Details Name: Adrian Bean MRN: [id number] DOB: [birth year] Today's Date: 06/15/2017    History of present illness Seen initially post-op R femoral to AK popliteal BPG and 3rd toe amputation. Now s/p R transmetatarsal amputation on 12/14. PMH: HTN, DM, PVD with L toe amputations, renal disease, current smoker.   OT comments  Pt now s/p R transmetatarsal amputation 06/13/17 and seen for re-evaluation. He presents with significantly limited functional mobility at this time requiring mod-max assist for toilet transfers and max assist for LB ADL at this time. Facilitated improved B UE strength in preparation for improved independence and safety with toilet transfers with chair push-ups and sit<>stands with RW this session. Pt requires physical assistance to maintain R LE NWB status and states "I don't understand why I can't just put my heel on the ground." Educated pt on importance and necessity of NWB status. At current functional level, pt will not be safe to return home alone and recommend short-term SNF level rehabilitation. Updated goals in care plans section.    Follow Up Recommendations  SNF;Supervision/Assistance - 24 hour    Equipment Recommendations  (defer to next venue of care)    Recommendations for Other Services      Precautions / Restrictions Precautions Precautions: Fall Restrictions Weight Bearing Restrictions: Yes RLE Weight Bearing: Non weight bearing Other Position/Activity Restrictions: Strict NON-Weight bearing Rt foot       Mobility Bed Mobility               General bed mobility comments: OOB in chair on my arrival  Transfers Overall transfer level: Needs assistance Equipment used: Rolling walker (2 wheeled) Transfers: Sit to/from Stand Sit to Stand: Mod assist;Max assist         General transfer comment: Mod-max assist to power up into standing. Poor ability to maintain R LE NWB status.      Balance Overall balance assessment: Needs assistance Sitting-balance support: No upper extremity supported;Feet supported Sitting balance-Leahy Scale: Good     Standing balance support: Bilateral upper extremity supported Standing balance-Leahy Scale: Poor Standing balance comment: Relies on B UE support with static tasks.                            ADL either performed or assessed with clinical judgement   ADL Overall ADL's : Needs assistance/impaired Eating/Feeding: Independent                   Lower Body Dressing: Maximal assistance;Sit to/from stand   Toilet Transfer: Moderate assistance;Maximal assistance Toilet Transfer Details (indicate cue type and reason): Able to complete sit<>stand only. Once standing, unable to pivot on L foot. Poor ability to maintain NWB R LE.  Toileting- Water quality scientist and Hygiene: Sit to/from stand;Total assistance         General ADL Comments: Pt with poor ability to maintain R LE NWB status without physical assitance.      Vision   Additional Comments: Disconjugate gaze at baseline.    Perception     Praxis      Cognition Arousal/Alertness: Awake/alert Behavior During Therapy: Flat affect;Impulsive Overall Cognitive Status: Impaired/Different from baseline Area of Impairment: Safety/judgement                         Safety/Judgement: Decreased awareness of safety     General Comments: Pt with difficulty understanding the impact of his deficits on  his function        Exercises Exercises: Other exercises Other Exercises Other Exercises: Facilitated improved B UE and L LE strength for 10 repetitions of chair push-ups as a precursor to toilet transfers.    Shoulder Instructions       General Comments Pt on 3L supplemental O2 via [place] with stable O2 saturations throughout.     Pertinent Vitals/ Pain       Pain Assessment: Faces Faces Pain Scale: Hurts even more Pain Location: R  LE Pain Descriptors / Indicators: Sore;Throbbing Pain Intervention(s): Monitored during session;Patient requesting pain meds-RN notified  Home Living                                          Prior Functioning/Environment              Frequency  Min 2X/week        Progress Toward Goals  OT Goals(current goals can now be found in the care plan section)  Progress towards OT goals: Not progressing toward goals - comment;Goals drowngraded-see care plan(new R transmetatarsal amputation)  Acute Rehab OT Goals Patient Stated Goal: Go to SNF OT Goal Formulation: With patient Time For Goal Achievement: 06/19/17 Potential to Achieve Goals: Good ADL Goals Pt Will Perform Grooming: with min assist;standing Pt Will Perform Lower Body Bathing: with min assist;sit to/from stand Pt Will Perform Lower Body Dressing: with min assist Pt Will Transfer to Toilet: with min assist;stand pivot transfer;bedside commode Pt Will Perform Toileting - Clothing Manipulation and hygiene: with min assist;sit to/from stand  Plan Discharge plan needs to be updated    Co-evaluation                 AM-PAC PT "6 Clicks" Daily Activity     Outcome Measure   Help from another person eating meals?: A Little Help from another person taking care of personal grooming?: A Little Help from another person toileting, which includes using toliet, bedpan, or urinal?: A Lot Help from another person bathing (including washing, rinsing, drying)?: A Lot Help from another person to put on and taking off regular upper body clothing?: A Little Help from another person to put on and taking off regular lower body clothing?: A Lot 6 Click Score: 15    End of Session Equipment Utilized During Treatment: Gait belt;Rolling walker;Oxygen  OT Visit Diagnosis: Unsteadiness on feet (R26.81);Other abnormalities of gait and mobility (R26.89);Pain Pain - Right/Left: Right Pain - part of body: Ankle and  joints of foot   Activity Tolerance Patient limited by lethargy   Patient Left in chair;with call bell/phone within reach   Nurse Communication Mobility status;Patient requests pain meds        Time: 9628-3662 OT Time Calculation (min): 21 min  Charges: OT General Charges $OT Visit: 1 Visit OT Evaluation $OT Re-eval: [place] A Francina Beery, MS OTR/L  Pager: Kyle A Daelyn Mozer 06/15/2017, 9:07 AM

## 2017-06-15 NOTE — Plan of Care (Signed)
  Pain Managment: General experience of comfort will improve 06/15/2017 2331 - Progressing by Lesle Reek, RN

## 2017-06-15 NOTE — Progress Notes (Addendum)
  Progress Note    06/15/2017 10:33 AM 2 Days Post-Op  Subjective: Pain in right foot TMA site stable; pain is worse when working with therapy teams   Vitals:   06/15/17 0456 06/15/17 0836  BP: 109/65 128/75  Pulse: 71   Resp: 11 (!) 23  Temp: 97.9 F (36.6 C) 98.4 F (36.9 C)  SpO2: 96% 100%   Physical Exam: Lungs: Nonlabored breathing Incisions: Right groin and medial thigh incisions stable without palpable hematoma or drainage with manipulation of incision; incision skin edges are viable without surrounding erythema or obvious infection Extremities: Palpable right DP; wound VAC left in place Abdomen: Soft Neurologic: Moving all extremities well  CBC    Component Value Date/Time   WBC 11.3 (H) 06/13/2017 0740   RBC 2.98 (L) 06/13/2017 0740   HGB 9.2 (L) 06/13/2017 0740   HCT 28.9 (L) 06/13/2017 0740   PLT 388 06/13/2017 0740   MCV 97.0 06/13/2017 0740   MCH 30.9 06/13/2017 0740   MCHC 31.8 06/13/2017 0740   RDW 13.1 06/13/2017 0740   LYMPHSABS 1.6 06/13/2017 0740   MONOABS 0.9 06/13/2017 0740   EOSABS 0.4 06/13/2017 0740   BASOSABS 0.1 06/13/2017 0740    BMET    Component Value Date/Time   NA 132 (L) 06/14/2017 1832   K 4.4 06/14/2017 1832   CL 97 (L) 06/14/2017 1832   CO2 29 06/14/2017 1832   GLUCOSE 139 (H) 06/14/2017 1832   BUN 25 (H) 06/14/2017 1832   CREATININE 1.37 (H) 06/14/2017 1832   CALCIUM 8.2 (L) 06/14/2017 1832   GFRNONAA 51 (L) 06/14/2017 1832   GFRAA 60 (L) 06/14/2017 1832    INR    Component Value Date/Time   INR 1.25 06/04/2017 0332     Intake/Output Summary (Last 24 hours) at 06/15/2017 1033 Last data filed at 06/15/2017 1001 Gross per 24 hour  Intake 1167 ml  Output 1760 ml  Net -593 ml     Assessment/Plan:  66 y.o. male is s/p right femoral to AK popliteal artery bypass with GS V conduit; subsequent right TMA by Dr. Sharol Given 2 Days Post-Op   Continue current pain management regimen Defer weightbearing status and  management of wound VAC to Dr. Lannette Donath from vascular surgery standpoint for discharge; he will follow-up in office in about 2-3 weeks Vascular surgery will follow patient while in-house on an as-needed basis  Adrian Ligas, PA-C [facility] [phone] 06/15/2017 10:33 AM   I have examined the patient, reviewed and agree with above.  Adrian Jews, MD 06/15/2017 12:10 PM

## 2017-06-15 NOTE — Plan of Care (Signed)
Patient is progressing towards goal of care

## 2017-06-15 NOTE — Clinical Social Work Note (Signed)
Clinical Social Work Assessment  Patient Details  Name: Adrian Bean MRN: [id number] Date of Birth: March 05, 1951  Date of referral:  06/15/17               Reason for consult:  Facility Placement, Discharge Planning                Permission sought to share information with:  Family Supports Permission granted to share information::  Yes, Verbal Permission Granted  Name::        Agency::  snf  Relationship::     Contact Information:     Housing/Transportation Living arrangements for the past 2 months:  Apartment Source of Information:  Patient Patient Interpreter Needed:  None Criminal Activity/Legal Involvement Pertinent to Current Situation/Hospitalization:  No - Comment as needed Significant Relationships:  Spouse, Siblings(pt stated him and wife are separated ) Lives with:  Self Do you feel safe going back to the place where you live?  No Need for family participation in patient care:  Yes (Comment)  Care giving concerns:No family at bedside  Social Worker assessment / plan:  CSW met pateint at bedside to discuss disposition plan and to offer support. Pateint stated he lives on the second floor of an apartment complex byu himself. Pateint stated he has a wife but they are separated and sometime "when she feels like talking to him she is around". Patient stated he has support from his brother and brother assist him with his needs. Patient is agreeable to discharge to SNF and would prefer to go back to [facility] since he has been there prior to coming to [facility]. CSW to follow up with patient once bed offers are available   Employment status:  Retired Nurse, adult PT Recommendations:  [place] / Referral to community resources:  [place]  Patient/Family's Response to care:  Patient very pleasant. Patient appreciates medical staff   Patient/Family's Understanding of and Emotional Response to Diagnosis,  Current Treatment, and Prognosis:  Patient aware that he is not safe to discharge home and is wanting to go back to SNF upon discharge   Emotional Assessment Appearance:  Appears stated age Attitude/Demeanor/Rapport:  Other Affect (typically observed):  Pleasant Orientation:  Oriented to Place, Oriented to  Time, Oriented to Situation, Oriented to Self Alcohol / Substance use:  Not Applicable Psych involvement (Current and /or in the community):  No (Comment)  Discharge Needs  Concerns to be addressed:  No discharge needs identified Readmission within the last 30 days:  No Current discharge risk:  None Barriers to Discharge:  No Barriers Identified   Wende Neighbors, LCSW 06/15/2017, 12:25 PM

## 2017-06-15 NOTE — Plan of Care (Signed)
  Progressing Health Behavior/Discharge Planning: Ability to manage health-related needs will improve 06/15/2017 0048 - Progressing by Blair Promise, RN Clinical Measurements: Ability to maintain clinical measurements within normal limits will improve 06/15/2017 0048 - Progressing by Blair Promise, RN Will remain free from infection 06/15/2017 0048 - Progressing by Blair Promise, RN Diagnostic test results will improve 06/15/2017 0048 - Progressing by Blair Promise, RN Respiratory complications will improve 06/15/2017 0048 - Progressing by Blair Promise, RN Cardiovascular complication will be avoided 06/15/2017 0048 - Progressing by Blair Promise, RN Activity: Risk for activity intolerance will decrease 06/15/2017 0048 - Progressing by Blair Promise, RN Coping: Level of anxiety will decrease 06/15/2017 0048 - Progressing by Blair Promise, RN Elimination: Will not experience complications related to bowel motility 06/15/2017 0048 - Progressing by Blair Promise, RN Will not experience complications related to urinary retention 06/15/2017 0048 - Progressing by Blair Promise, RN Pain Managment: General experience of comfort will improve 06/15/2017 0048 - Progressing by Blair Promise, RN Safety: Ability to remain free from injury will improve 06/15/2017 0048 - Progressing by Blair Promise, RN Skin Integrity: Risk for impaired skin integrity will decrease 06/15/2017 0048 - Progressing by Blair Promise, RN Education: Knowledge of prescribed regimen will improve 06/15/2017 0048 - Progressing by Blair Promise, RN Activity: Ability to tolerate increased activity will improve 06/15/2017 0048 - Progressing by Blair Promise, RN Bowel/Gastric: Gastrointestinal status for postoperative course will improve 06/15/2017 0048 - Progressing by Blair Promise, RN Clinical Measurements: Postoperative complications will be avoided or minimized 06/15/2017 0048 - Progressing by Blair Promise, RN Signs and symptoms of graft occlusion will improve 06/15/2017 0048 - Progressing by Blair Promise, RN Skin Integrity: Demonstration of wound healing without infection will improve 06/15/2017 0048 - Progressing by Blair Promise, RN Education: Knowledge of the prescribed therapeutic regimen will improve 06/15/2017 0048 - Progressing by Blair Promise, RN Ability to verbalize activity precautions or restrictions will improve 06/15/2017 0048 - Progressing by Blair Promise, RN Understanding of discharge needs will improve 06/15/2017 0048 - Progressing by Blair Promise, RN Self-Care: Ability to meet self-care needs will improve 06/15/2017 0048 - Progressing by Blair Promise, RN Skin Integrity: Demonstration of wound healing without infection will improve 06/15/2017 0048 - Progressing by Blair Promise, RN

## 2017-06-16 ENCOUNTER — Inpatient Hospital Stay (HOSPITAL_COMMUNITY): Payer: Medicare Other

## 2017-06-16 ENCOUNTER — Encounter (HOSPITAL_COMMUNITY): Payer: Self-pay | Admitting: Radiology

## 2017-06-16 HISTORY — PX: IR THORACENTESIS ASP PLEURAL SPACE W/IMG GUIDE: IMG5380

## 2017-06-16 LAB — COMPREHENSIVE METABOLIC PANEL
ALBUMIN: 1.9 g/dL — AB (ref 3.5–5.0)
ALK PHOS: 123 U/L (ref 38–126)
ALT: 14 U/L — AB (ref 17–63)
AST: 17 U/L (ref 15–41)
Anion gap: 8 (ref 5–15)
BILIRUBIN TOTAL: 0.7 mg/dL (ref 0.3–1.2)
BUN: 23 mg/dL — AB (ref 6–20)
CALCIUM: 8.6 mg/dL — AB (ref 8.9–10.3)
CO2: 31 mmol/L (ref 22–32)
CREATININE: 1.21 mg/dL (ref 0.61–1.24)
Chloride: 96 mmol/L — ABNORMAL LOW (ref 101–111)
GFR calc Af Amer: >60 mL/min (ref >60)
GFR calc non Af Amer: 60 mL/min — ABNORMAL LOW (ref >60)
GLUCOSE: 155 mg/dL — AB (ref 65–99)
Potassium: 3.9 mmol/L (ref 3.5–5.1)
SODIUM: 135 mmol/L (ref 135–145)
TOTAL PROTEIN: 6.1 g/dL — AB (ref 6.5–8.1)

## 2017-06-16 LAB — CBC WITH DIFFERENTIAL/PLATELET
BASOS ABS: 0 10*3/uL (ref 0.0–0.1)
BASOS PCT: 1 %
EOS ABS: 0.3 10*3/uL (ref 0.0–0.7)
EOS PCT: 3 %
HEMATOCRIT: 29.9 % — AB (ref 39.0–52.0)
Hemoglobin: 9.4 g/dL — ABNORMAL LOW (ref 13.0–17.0)
Lymphocytes Relative: 19 %
Lymphs Abs: 1.6 10*3/uL (ref 0.7–4.0)
MCH: 30.2 pg (ref 26.0–34.0)
MCHC: 31.4 g/dL (ref 30.0–36.0)
MCV: 96.1 fL (ref 78.0–100.0)
MONO ABS: 0.7 10*3/uL (ref 0.1–1.0)
MONOS PCT: 8 %
NEUTROS ABS: 6.2 10*3/uL (ref 1.7–7.7)
Neutrophils Relative %: 69 %
PLATELETS: 334 10*3/uL (ref 150–400)
RBC: 3.11 MIL/uL — ABNORMAL LOW (ref 4.22–5.81)
RDW: 13 % (ref 11.5–15.5)
WBC: 8.8 10*3/uL (ref 4.0–10.5)

## 2017-06-16 LAB — GLUCOSE, CAPILLARY
GLUCOSE-CAPILLARY: 116 mg/dL — AB (ref 65–99)
Glucose-Capillary: 148 mg/dL — ABNORMAL HIGH (ref 65–99)
Glucose-Capillary: 138 mg/dL — ABNORMAL HIGH (ref 65–99)
Glucose-Capillary: 124 mg/dL — ABNORMAL HIGH (ref 65–99)

## 2017-06-16 LAB — LACTATE DEHYDROGENASE: LDH: 123 U/L (ref 98–192)

## 2017-06-16 MED ORDER — LIDOCAINE HCL (PF) 2 % IJ SOLN
INTRAMUSCULAR | Status: DC | PRN
  Administered 2017-06-16: 5 mL

## 2017-06-16 MED ORDER — LIDOCAINE 2% (20 MG/ML) 5 ML SYRINGE
INTRAMUSCULAR | Status: AC
  Filled 2017-06-16: qty 5

## 2017-06-16 NOTE — Progress Notes (Signed)
PROGRESS NOTE [facility]   Selig Savannah   TFT:[id number] DOB: [birth year]  DOA: 06/02/2017 PCP: [name], MD   Brief Narrative:  Adrian Bean is a 66 y/o M with with PMH of type II DM, neuropathy, PAD, left foot amputation, tobacco abuse; admitted on 06/02/2017, presented with complaint of right leg wound on third toe, was found to have dry gangrene.  Initially admitted at [facility] for cellulitis and gangrene and started on IV antibiotics. CTA showed extensive bilateral PVD and patient was transferred to [facility] after discussion with vascular surgery.  Underwent bilateral angiogram and underwent for aortofemoral popliteal bypass and R 3rd toe amputation. Patient developed atelectasis and was placed on O2 supplementation. Follow up post op, patient with increase in WBC and R foot erythema. MRI does not show osteomyelitis. S/p TMA     Subjective: Patient continue to do well. Remains in O2 supplementations   Assessment & Plan: 1. Right third toe gangrene.   S/P amputation of the right third toe.  06/04/2017 Right femoral to popliteal bypass.  06/04/2017 Peripheral vascular disease. Sepsis secondary to cellulitis. 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 MRSA PCR is negative. Initially on IV vancomycin and Zosyn. Switch to Unasyn and stopped on 06/06/2017. Due to leukocytosis vascular surgery resumed antibiotics. Patient currently on IV doxy, will continue abx for 4 weeks.   2. Type 2 diabetes mellitus. Hemoglobin A1c 6.3. Diabetic neuropathy. CBG's continues to be stable  Holding home oral hypoglycemics Continue current management   3. Tobacco abuse  Smoking cessation discussed   4.  Hepatic flexure of the colon thickening. Incidentally noted on the CT scan. Will require outpatient GI follow-up with  colonoscopy.  5.  Acute hypoxic respiratory failure. - pleural effusion with suspected pneumonia - improving  Wean O2 as tolerated to RA, patient does not use O2 at baseline. Vascular congestion noted on chest x-ray 12/9, repeated CXR 12/13 shows stable moderate R pleural effusion with atelectasis vs infiltrates - patient was on Unasyn - switched to Doxy will continue for now he will need total 10 days. PNA - patient with cough, increase WBC and hypoxic.  D/c Lasix due to increase in Cr  Repeated CXR shows suspected moderate to large pleural effusion in the Right side  CT shows Consolidation with air bronchogram in R middle and lowe lobe also left lower lobe consolidation with air bronchogram and pleural effusion.  Continue abx therapy, OOB as tolerated, incentive spirometry. Thoracentesis attempted but effusions loculated, no able to drain.  Wean O2 as tolerated   6.  Acute kidney injury. - Cr improved  Due to diuresis  D/c Lasix   Monitor for now   7. Right thyroid nodule  [place] as outpatient   DVT prophylaxis: Heparin sq Code Status: Full  Family Communication: None at bedside  Disposition Plan: SNF in AM   Consultants:   Vascular surgery   Procedures:  S/P amputation of the right third toe.  06/04/2017 Right femoral to popliteal bypass.  06/04/2017  Antimicrobials: Anti-infectives (From admission, onward)   Start     Dose/Rate Route Frequency Ordered Stop   06/14/17 1000  doxycycline (VIBRAMYCIN) 100 mg in dextrose 5 % 250 mL IVPB     100 mg 125 mL/hr over 120 Minutes Intravenous Every 12 hours 06/14/17 0743     06/13/17 0800  ceFAZolin (ANCEF) IVPB 2g/100 mL  premix     2 g 200 mL/hr over 30 Minutes Intravenous To [facility] 06/12/17 2108 06/13/17 1433   06/12/17 2200  doxycycline (VIBRA-TABS) tablet 100 mg  Status:  Discontinued     100 mg Oral Every 12 hours 06/12/17 1623 06/14/17 0743   06/08/17 0830  ampicillin-sulbactam (UNASYN) 1.5 g in sodium chloride 0.9 %  50 mL IVPB  Status:  Discontinued     1.5 g 100 mL/hr over 30 Minutes Intravenous Every 6 hours 06/08/17 0716 06/12/17 1623   06/05/17 1130  ampicillin-sulbactam (UNASYN) 1.5 g in sodium chloride 0.9 % 50 mL IVPB  Status:  Discontinued     1.5 g 100 mL/hr over 30 Minutes Intravenous Every 6 hours 06/05/17 0841 06/06/17 1308   06/03/17 0800  ceFAZolin (ANCEF) IVPB 1 g/50 mL premix     1 g 100 mL/hr over 30 Minutes Intravenous On call 06/02/17 1704 06/04/17 0800   06/03/17 0600  vancomycin (VANCOCIN) IVPB 1000 mg/200 mL premix  Status:  Discontinued     1,000 mg 200 mL/hr over 60 Minutes Intravenous Every 12 hours 06/02/17 1917 06/05/17 0756   06/02/17 2000  piperacillin-tazobactam (ZOSYN) IVPB 3.375 g  Status:  Discontinued     3.375 g 12.5 mL/hr over 240 Minutes Intravenous Every 8 hours 06/02/17 1912 06/05/17 0757         Objective: Vitals:   06/16/17 0736 06/16/17 0800 06/16/17 1300 06/16/17 1333  BP: 126/69 105/65 116/71 106/65  Pulse:      Resp: 15 12    Temp: (!) 97 F (36.1 C) 98.1 F (36.7 C)    TempSrc: Oral Oral    SpO2: 96% 96%    Weight:      Height:        Intake/Output Summary (Last 24 hours) at 06/16/2017 1650 Last data filed at 06/16/2017 1600 Gross per 24 hour  Intake 720 ml  Output 1250 ml  Net -530 ml   Filed Weights   06/04/17 1008 06/13/17 1234 06/14/17 0513  Weight: 80.3 kg (177 lb) 80.3 kg (177 lb) 86.1 kg (189 lb 14.4 oz)    Examination:   General: NAD Cardiovascular: RRR S1s2 Respiratory: Air entry improved, still required O2  Abdominal: Soft NTND  Extremities: R TMA with wound vac in place    Data Reviewed: I have personally reviewed following labs and imaging studies  CBC: Recent Labs  Lab 06/10/17 0443 06/11/17 0252 06/12/17 0310 06/13/17 0740 06/16/17 0235  WBC 12.9* 14.2* 13.9* 11.3* 8.8  NEUTROABS  --  10.0* 11.1* 8.4* 6.2  HGB 9.6* 9.5* 9.2* 9.2* 9.4*  HCT 30.3* 30.1* 29.5* 28.9* 29.9*  MCV 97.1 95.9 95.8 97.0 96.1   PLT 386 415* 396 388 742   Basic Metabolic Panel: Recent Labs  Lab 06/11/17 0252 06/12/17 0310 06/13/17 0740 06/14/17 1832 06/16/17 0235  NA 138 137 136 132* 135  K 4.4 4.3 4.2 4.4 3.9  CL 102 101 100* 97* 96*  CO2 30 29 30 29 31   GLUCOSE 146* 151* 152* 139* 155*  BUN 27* 26* 24* 25* 23*  CREATININE 1.17 1.15 1.16 1.37* 1.21  CALCIUM 8.7* 8.6* 8.5* 8.2* 8.6*   GFR: Estimated Creatinine Clearance: 64.1 mL/min (by C-G formula based on SCr of 1.21 mg/dL). Liver Function Tests: Recent Labs  Lab 06/12/17 0310 06/16/17 0235  AST  --  17  ALT  --  14*  ALKPHOS  --  123  BILITOT  --  0.7  PROT  --  6.1*  ALBUMIN 1.8* 1.9*   No results for input(s): LIPASE, AMYLASE in the last 168 hours. No results for input(s): AMMONIA in the last 168 hours. Coagulation Profile: No results for input(s): INR, PROTIME in the last 168 hours. Cardiac Enzymes: No results for input(s): CKTOTAL, CKMB, CKMBINDEX, TROPONINI in the last 168 hours. BNP (last 3 results) No results for input(s): PROBNP in the last 8760 hours. HbA1C: No results for input(s): HGBA1C in the last 72 hours. CBG: Recent Labs  Lab 06/15/17 1702 06/15/17 2023 06/16/17 0600 06/16/17 1144 06/16/17 1614  GLUCAP 109* 137* 148* 138* 124*   Lipid Profile: No results for input(s): CHOL, HDL, LDLCALC, TRIG, CHOLHDL, LDLDIRECT in the last 72 hours. Thyroid Function Tests: No results for input(s): TSH, T4TOTAL, FREET4, T3FREE, THYROIDAB in the last 72 hours. Anemia Panel: No results for input(s): VITAMINB12, FOLATE, FERRITIN, TIBC, IRON, RETICCTPCT in the last 72 hours. Sepsis Labs: No results for input(s): PROCALCITON, LATICACIDVEN in the last 168 hours.  Recent Results (from the past 240 hour(s))  Aerobic/Anaerobic Culture (surgical/deep wound)     Status: None (Preliminary result)   Collection Time: 06/13/17  2:38 PM  Result Value Ref Range Status   Specimen Description ABSCESS RIGHT FOOT  Final   Special Requests  NONE  Final   Gram Stain   Final    FEW WBC PRESENT,BOTH PMN AND MONONUCLEAR NO ORGANISMS SEEN    Culture   Final    NO GROWTH 3 DAYS NO ANAEROBES ISOLATED; CULTURE IN PROGRESS FOR 5 DAYS   Report Status PENDING  Incomplete      Radiology Studies: Dg Chest 1 View  Result Date: 06/16/2017 CLINICAL DATA:  Right thoracentesis EXAM: CHEST 1 VIEW COMPARISON:  06/14/2017 CT chest FINDINGS: Small bilateral pleural effusions, right greater than left. Bilateral diffuse interstitial thickening. No pneumothorax. Stable cardiomegaly. No acute osseous abnormality. IMPRESSION: No right pneumothorax status post thoracentesis. Mild CHF. Electronically Signed   By: Kathreen Devoid   On: 06/16/2017 14:06   Ct Chest Wo Contrast  Addendum Date: 06/14/2017   ADDENDUM REPORT: 06/14/2017 23:22 ADDENDUM: Addendum created to add an additional impression. IMPRESSION #7: Right thyroid nodule measuring 2.1 cm. Recommend further evaluation with thyroid ultrasound. Electronically Signed   By: Jeb Levering M.D.   On: 06/14/2017 23:22   Result Date: 06/14/2017 CLINICAL DATA:  Follow-up pleural effusion. EXAM: CT CHEST WITHOUT CONTRAST TECHNIQUE: Multidetector CT imaging of the chest was performed following the standard protocol without IV contrast. COMPARISON:  Radiographs earlier this day. Additional prior exams. Chest CT 07/03/2011 FINDINGS: Cardiovascular: Moderate multi chamber cardiomegaly. Coronary artery calcifications versus stents. Tortuous atherosclerotic aorta with moderate atherosclerosis. Ascending aorta measures 4.1 cm greatest dimension, unchanged from exam 5 years prior. Small volume pericardial fluid. Mediastinum/Nodes: Multiple small subcentimeter mediastinal nodes. Subcarinal node measures 15 mm short axis. Suspect prominent right hilar nodes, suboptimally assessed without IV contrast. The esophagus is decompressed. There is a 2.1 cm right thyroid nodule. Lungs/Pleura: Moderate emphysema. Near complete  consolidation of the right lower lobe with air bronchograms. Consolidation throughout majority of the right middle lobe with air bronchograms. Small partially loculated right pleural effusion measures simple fluid density. Pleural fluid tracks in the fissure. Small nonspecific ground-glass opacity in the right upper lobe, image 83 series 4. Minimal debris/secretions in the right mainstem bronchus and bronchus intermedius. Dependent left lower lobe consolidation with air bronchograms. Small left pleural effusion. Upper Abdomen: Postcholecystectomy. No evidence of acute abnormality. Musculoskeletal: Degenerative change in the spine. There are  no acute or suspicious osseous abnormalities. IMPRESSION: 1. Consolidation with air bronchograms throughout right middle and right lower lobe. This may be airspace filling process such as pneumonia versus atelectasis. There is mild volume loss in the right lung favoring atelectasis. Underlying neoplastic etiology is not entirely excluded, no central obstructing lesion is visualized. Small partially loculated pleural effusion. 2. Left lower lobe consolidation with air bronchograms and adjacent small pleural effusions, similar appearance to process on the right but to a lesser extent. 3. Prominent subcarinal node and suspected right hilar adenopathy, likely reactive but nonspecific. 4. Moderate emphysema. 5. Aortic atherosclerosis with minimal aneurysmal dilatation of the ascending aorta measuring 4.1 cm. Recommend annual imaging followup by CTA or MRA. This recommendation follows 2010 ACCF/AHA/AATS/ACR/ASA/SCA/SCAI/SIR/STS/SVM Guidelines for the Diagnosis and Management of Patients with Thoracic Aortic Disease. Circulation. 2010; 121: [id number] 6. Cardiomegaly and coronary artery calcifications. Aortic Atherosclerosis (ICD10-I70.0) and Emphysema (ICD10-J43.9). Aortic aneurysm NOS (ICD10-I71.9). Electronically Signed: By: Jeb Levering M.D. On: 06/14/2017 21:51   Ir  Thoracentesis Asp Pleural Space W/img Guide  Result Date: 06/16/2017 INDICATION: Shortness of breath. Right-sided pleural effusion. Patient with history of prior complex pleural effusion requiring multiple large bore chest tubes (possible VATS procedure). Request diagnostic and therapeutic thoracentesis. EXAM: ATTEMPTED ULTRASOUND GUIDED RIGHT THORACENTESIS MEDICATIONS: None. COMPARISON:  Chest CT - 32/35/5732 COMPLICATIONS: None immediate. PROCEDURE: An ultrasound guided thoracentesis was thoroughly discussed with the patient and questions answered. The benefits, risks, alternatives and complications were also discussed. The patient understands and wishes to proceed with the procedure. Written consent was obtained. Ultrasound of the right chest demonstrates a trace, but densely loculated appearing effusion. Decision was made to proceed with attempted thoracentesis. The area was then prepped and draped in the normal sterile fashion. 1% Lidocaine was used for local anesthesia. Under ultrasound guidance a Safe-T-Centesis catheter was introduced. Despite visualization of the catheter within the presumed fluid collection, no fluid could be aspirated. The catheter was removed and a dressing applied. FINDINGS: Trace loculated right-sided pleural effusion. Attempted ultrasound-guided right-sided thoracentesis yielded no pleural fluid. IMPRESSION: Trace loculated right-sided pleural effusion. Attempted ultrasound guided right thoracentesis yielding no pleural fluid. Read by: Ascencion Dike PA-C Electronically Signed   By: Sandi Mariscal M.D.   On: 06/16/2017 14:13      Scheduled Meds: . aspirin EC  81 mg Oral Daily  . atorvastatin  10 mg Oral q1800  . collagenase   Topical Daily  . docusate sodium  100 mg Oral BID  . feeding supplement (GLUCERNA SHAKE)  237 mL Oral TID BM  . feeding supplement (PRO-STAT SUGAR FREE 64)  30 mL Oral BID  . gabapentin  600 mg Oral TID  . heparin injection (subcutaneous)  5,000 Units  Subcutaneous Q8H  . insulin aspart  0-15 Units Subcutaneous TID WC  . lidocaine      . lidocaine      . multivitamin with minerals  1 tablet Oral Daily  . nicotine  14 mg Transdermal Daily  . pantoprazole  40 mg Oral Daily  . polyethylene glycol  17 g Oral BID  . senna-docusate  2 tablet Oral BID  . sodium chloride flush  3 mL Intravenous Q12H   Continuous Infusions: . sodium chloride    . sodium chloride Stopped (06/14/17 1310)  . doxycycline (VIBRAMYCIN) IV Stopped (06/16/17 1044)  . methocarbamol (ROBAXIN)  IV       LOS: 14 days   Time spent: Total of 25 minutes spent with pt, greater than 50% of which  was spent in discussion of  treatment, counseling and coordination of care  Chipper Oman, MD Pager: Text Page via [email or web address]   If 7PM-7AM, please contact night-coverage [email or web address] 06/16/2017, 4:50 PM

## 2017-06-16 NOTE — Procedures (Signed)
PROCEDURE SUMMARY:  Small densely loculated right pleural effusion. Pt states he had prior complex effusion requiring multiple large bore chest tubes (possible VATS)  Attempted US guided right thoracentesis confirming catheter placement into loculated effusion, however yielded no return of fluid. Procedure aborted. Pt tolerated procedure well. No immediate complications.  No specimen obtained for labs. CXR ordered.  Ascencion Dike PA-C 06/16/2017 1:46 PM

## 2017-06-16 NOTE — Progress Notes (Signed)
Physical Therapy Treatment Patient Details Name: Adrian Bean MRN: [id number] DOB: [birth year] Today's Date: 06/16/2017    History of Present Illness Seen initially post-op R femoral to AK popliteal BPG and 3rd toe amputation. Re-evaluated 12/15 after Rt transmetatarsal amputation on 12/14. PMH: HTN, DM, PVD with L toe amputations, renal disease, current smoker.    PT Comments    Pt currently limited to transfers and ther ex, as he is unable to maintain R LE NWB. Pt reports dizziness on rising into seated possion; however VSS.  Plan to trial knee walker in next session to increase pt's functional independence and safety with mobility. Will continue to follow acutely.   Follow Up Recommendations  SNF     Equipment Recommendations  (TBD next venue of care)    Recommendations for Other Services OT consult     Precautions / Restrictions Precautions Precautions: Fall Restrictions Weight Bearing Restrictions: Yes RLE Weight Bearing: Non weight bearing Other Position/Activity Restrictions: Strict NON-Weight bearing Rt foot    Mobility  Bed Mobility Overal bed mobility: Needs Assistance Bed Mobility: Supine to Sit     Supine to sit: Min guard     General bed mobility comments: Min guard for safety to come EOB. Pt reporting dizziness on rising into seated position  Transfers Overall transfer level: Needs assistance Equipment used: Rolling walker (2 wheeled) Transfers: Sit to/from [facility] Sit to Stand: Mod assist Stand pivot transfers: Mod assist;+2 safety/equipment(+2 used for line management, can be done with +1)       General transfer comment: mod A to power up into standing with cues for technique. Frequent cueing during pivot transfer for NWB. Pt with poor ability to maintain R LE NWB  Ambulation/Gait             General Gait Details: Not safe due to inability to maintain NWB Rt   Stairs            Wheelchair Mobility     Modified Rankin (Stroke Patients Only)       Balance Overall balance assessment: Needs assistance Sitting-balance support: No upper extremity supported;Feet supported Sitting balance-Leahy Scale: Good     Standing balance support: Bilateral upper extremity supported Standing balance-Leahy Scale: Poor Standing balance comment: Relies on B UE support with static tasks.                             Cognition Arousal/Alertness: Awake/alert Behavior During Therapy: Flat affect Overall Cognitive Status: Within Functional Limits for tasks assessed                                        Exercises General Exercises - Lower Extremity Long Arc Quad: AROM;10 reps;Seated;Right Hip Flexion/Marching: AROM;Both;10 reps;Seated    General Comments General comments (skin integrity, edema, etc.): Pt on 1 L O2      Pertinent Vitals/Pain Pain Assessment: 0-10 Pain Score: 10-Worst pain ever Pain Location: R LE Pain Descriptors / Indicators: Sore;Throbbing Pain Intervention(s): Monitored during session;Repositioned    Home Living                      Prior Function            PT Goals (current goals can now be found in the care plan section) Acute Rehab PT Goals Patient Stated Goal: Go to SNF  PT Goal Formulation: With patient Time For Goal Achievement: 06/28/17 Potential to Achieve Goals: Good    Frequency    Min 3X/week      PT Plan Current plan remains appropriate    Co-evaluation              AM-PAC PT "6 Clicks" Daily Activity  Outcome Measure  Difficulty turning over in bed (including adjusting bedclothes, sheets and blankets)?: None Difficulty moving from lying on back to sitting on the side of the bed? : Unable Difficulty sitting down on and standing up from a chair with arms (e.g., wheelchair, bedside commode, etc,.)?: Unable Help needed moving to and from a bed to chair (including a wheelchair)?: A Lot Help needed  walking in hospital room?: Total Help needed climbing 3-5 steps with a railing? : Total 6 Click Score: 10    End of Session Equipment Utilized During Treatment: Gait belt;Oxygen Activity Tolerance: Patient limited by pain(limited by inability to maintain R LE NWB) Patient left: in chair;with call bell/phone within reach;with chair alarm set Nurse Communication: Mobility status;Patient requests pain meds PT Visit Diagnosis: Muscle weakness (generalized) (M62.81);Difficulty in walking, not elsewhere classified (R26.2);Pain Pain - Right/Left: Right Pain - part of body: Leg     Time: 8453-6468 PT Time Calculation (min) (ACUTE ONLY): 28 min  Charges:  $Therapeutic Exercise: 8-22 mins $Therapeutic Activity: 8-22 mins                    G Codes:      Adrian Bean, [place] Pager [phone] Acute Rehab   Allena Katz 06/16/2017, 11:50 AM

## 2017-06-16 NOTE — Care Management Important Message (Signed)
Important Message  Patient Details  Name: Adrian Bean MRN: [id number] Date of Birth: [birth year]   Medicare Important Message Given:  Yes    Nathen May 06/16/2017, 10:11 AM

## 2017-06-16 NOTE — NC FL2 (Signed)
[place] LEVEL OF CARE SCREENING TOOL     IDENTIFICATION  Patient Name: Adrian Bean Birthdate: 07-27-1950 Sex: male Admission Date (Current Location): 06/02/2017  [facility] [place] Number:  Herbalist and Address:  [facility] [place] [address] [place], [place] [address]      Provider Number: [id number]  Attending Physician Name and Address:  Patrecia Pour, Christean Grief, MD  Relative Name and Phone Number:       Current Level of Care: Hospital Recommended Level of Care: [place] Prior Approval Number:    Date Approved/Denied:   PASRR Number: [id number]  Discharge Plan:      Current Diagnoses: Patient Active Problem List   Diagnosis Date Noted  . PAD (peripheral artery disease) ([place]) 06/02/2017  . Gangrene of toe of right foot ([place]) 06/02/2017  . Sepsis ([place]) 06/02/2017  . DM (diabetes mellitus), type 2 ([place]) 06/02/2017  . Tobacco abuse 06/02/2017    Orientation RESPIRATION BLADDER Height & Weight     Time, Situation, Place, Self  Normal Continent Weight: 189 lb 14.4 oz (86.1 kg) Height:  6' (182.9 cm)  BEHAVIORAL SYMPTOMS/MOOD NEUROLOGICAL BOWEL NUTRITION STATUS      Continent Diet(carb modified)  AMBULATORY STATUS COMMUNICATION OF NEEDS Skin   Limited Assist Verbally Wound Vac, Surgical wounds(RIGHT TRANSMETATARSAL AMPUTATION )                       Personal Care Assistance Level of Assistance  Bathing, Feeding, Dressing Bathing Assistance: Limited assistance Feeding assistance: Independent Dressing Assistance: Limited assistance     Functional Limitations Info  Hearing, Speech, Sight Sight Info: Adequate(wear glasses) Hearing Info: Adequate Speech Info: Adequate    SPECIAL CARE FACTORS FREQUENCY  PT (By licensed PT), OT (By licensed OT)     PT Frequency: 5x wk OT Frequency: 5x wk            Contractures Contractures Info: Not present    Additional Factors Info  Code  Status, Allergies Code Status Info: Full Code Allergies Info: NKA           Current Medications (06/16/2017):  This is the current hospital active medication list Current Facility-Administered Medications  Medication Dose Route Frequency Provider Last Rate Last Dose  . 0.9 %  sodium chloride infusion  250 mL Intravenous PRN Bonnielee Haff, MD      . 0.9 %  sodium chloride infusion   Intravenous Continuous [name], MD   Stopped at 06/14/17 1310  . acetaminophen (TYLENOL) tablet 650 mg  650 mg Oral Q4H PRN [name], MD       Or  . acetaminophen (TYLENOL) suppository 650 mg  650 mg Rectal Q4H PRN [name], MD      . albuterol (PROVENTIL) (2.5 MG/3ML) 0.083% nebulizer solution 2.5 mg  2.5 mg Nebulization Q4H PRN Lavina Hamman, MD      . aspirin EC tablet 81 mg  81 mg Oral Daily Conrad Bingen, MD   81 mg at 06/16/17 0843  . atorvastatin (LIPITOR) tablet 10 mg  10 mg Oral q1800 Bonnielee Haff, MD   10 mg at 06/15/17 1722  . bisacodyl (DULCOLAX) suppository 10 mg  10 mg Rectal Daily PRN [name], MD      . collagenase (SANTYL) ointment   Topical Daily Lavina Hamman, MD      . docusate sodium (COLACE) capsule 100 mg  100 mg  Oral BID [name], MD   100 mg at 06/16/17 7035  . doxycycline (VIBRAMYCIN) 100 mg in dextrose 5 % 250 mL IVPB  100 mg Intravenous Q12H Patrecia Pour, Christean Grief, MD   Stopped at 06/16/17 1044  . feeding supplement (GLUCERNA SHAKE) (GLUCERNA SHAKE) liquid 237 mL  237 mL Oral TID BM Lavina Hamman, MD   237 mL at 06/12/17 1517  . feeding supplement (PRO-STAT SUGAR FREE 64) liquid 30 mL  30 mL Oral BID Lavina Hamman, MD   30 mL at 06/08/17 2209  . gabapentin (NEURONTIN) capsule 600 mg  600 mg Oral TID Bonnielee Haff, MD   600 mg at 06/16/17 0844  . heparin injection 5,000 Units  5,000 Units Subcutaneous Q8H Rhyne, Samantha J, PA-C   5,000 Units at 06/16/17 1435  . insulin aspart (novoLOG) injection 0-15 Units  0-15 Units Subcutaneous TID WC  Bonnielee Haff, MD   2 Units at 06/16/17 1207  . lidocaine (XYLOCAINE) 2 % injection   Infiltration PRN Ascencion Dike, PA-C   5 mL at 06/16/17 1324  . lidocaine 20 MG/ML injection           . lidocaine 20 MG/ML injection           . magnesium citrate solution 1 Bottle  1 Bottle Oral Once PRN [name], MD      . methocarbamol (ROBAXIN) tablet 500 mg  500 mg Oral Q6H PRN [name], MD   500 mg at 06/16/17 0093   Or  . methocarbamol (ROBAXIN) 500 mg in dextrose 5 % 50 mL IVPB  500 mg Intravenous Q6H PRN [name], MD      . metoCLOPramide (REGLAN) tablet 5-10 mg  5-10 mg Oral Q8H PRN [name], MD       Or  . metoCLOPramide (REGLAN) injection 5-10 mg  5-10 mg Intravenous Q8H PRN [name], MD      . multivitamin with minerals tablet 1 tablet  1 tablet Oral Daily Bonnielee Haff, MD   1 tablet at 06/16/17 0844  . nicotine (NICODERM CQ - dosed in mg/24 hours) patch 14 mg  14 mg Transdermal Daily Bonnielee Haff, MD   14 mg at 06/14/17 8182  . ondansetron (ZOFRAN) tablet 4 mg  4 mg Oral Q6H PRN [name], MD       Or  . ondansetron [facility]) injection 4 mg  4 mg Intravenous Q6H PRN [name], MD      . oxyCODONE-acetaminophen (PERCOCET) 7.5-325 MG per tablet 1 tablet  1 tablet Oral Q4H PRN Dagoberto Ligas, PA-C   1 tablet at 06/16/17 1248  . pantoprazole (PROTONIX) EC tablet 40 mg  40 mg Oral Daily Conrad Batavia, MD   40 mg at 06/15/17 0839  . polyethylene glycol (MIRALAX / GLYCOLAX) packet 17 g  17 g Oral BID Lavina Hamman, MD   17 g at 06/08/17 2208  . polyethylene glycol (MIRALAX / GLYCOLAX) packet 17 g  17 g Oral Daily PRN [name], MD      . senna-docusate (Senokot-S) tablet 2 tablet  2 tablet Oral BID Lavina Hamman, MD   2 tablet at 06/13/17 2223  . sodium chloride flush (NS) 0.9 % injection 3 mL  3 mL Intravenous Q12H Bonnielee Haff, MD   3 mL at 06/16/17 1209  . sodium chloride flush (NS) 0.9 % injection 3 mL  3 mL Intravenous PRN Bonnielee Haff,  MD         Discharge Medications: Please see discharge summary for a list of discharge medications.  Relevant Imaging Results:  Relevant Lab Results:   Additional Information SS# [id number]   Wende Neighbors, LCSW

## 2017-06-17 LAB — GLUCOSE, CAPILLARY
GLUCOSE-CAPILLARY: 160 mg/dL — AB (ref 65–99)
Glucose-Capillary: 124 mg/dL — ABNORMAL HIGH (ref 65–99)

## 2017-06-17 MED ORDER — SODIUM CHLORIDE 0.9% FLUSH
10.0000 mL | INTRAVENOUS | Status: DC | PRN
  Administered 2017-06-17: 10 mL
  Filled 2017-06-17: qty 40

## 2017-06-17 MED ORDER — OXYCODONE-ACETAMINOPHEN 7.5-325 MG PO TABS: 1.0000 | ORAL_TABLET | ORAL | 0 refills | Status: DC | PRN

## 2017-06-17 MED ORDER — DEXTROSE 5 % IV SOLN
2.0000 g | INTRAVENOUS | Status: DC
  Administered 2017-06-17: 2 g via INTRAVENOUS
  Filled 2017-06-17: qty 2

## 2017-06-17 MED ORDER — BISACODYL 10 MG RE SUPP: 10.0000 mg | Freq: Every day | RECTAL | 0 refills | Status: AC | PRN

## 2017-06-17 MED ORDER — SODIUM CHLORIDE 0.9% FLUSH: 10.0000 mL | Freq: Two times a day (BID) | INTRAVENOUS | Status: DC

## 2017-06-17 MED ORDER — VANCOMYCIN IV (FOR PTA / DISCHARGE USE ONLY): 1000.0000 mg | Freq: Two times a day (BID) | INTRAVENOUS | 0 refills | Status: AC

## 2017-06-17 MED ORDER — PRO-STAT SUGAR FREE PO LIQD: 30.0000 mL | Freq: Two times a day (BID) | ORAL | 0 refills | Status: AC

## 2017-06-17 MED ORDER — COLLAGENASE 250 UNIT/GM EX OINT: TOPICAL_OINTMENT | Freq: Every day | CUTANEOUS | 0 refills | Status: DC

## 2017-06-17 MED ORDER — NICOTINE 14 MG/24HR TD PT24: 14.0000 mg | MEDICATED_PATCH | Freq: Every day | TRANSDERMAL | 0 refills | Status: AC

## 2017-06-17 MED ORDER — VANCOMYCIN HCL 10 G IV SOLR
1750.0000 mg | Freq: Once | INTRAVENOUS | Status: AC
  Administered 2017-06-17: 1750 mg via INTRAVENOUS
  Filled 2017-06-17: qty 1750

## 2017-06-17 MED ORDER — CEFTRIAXONE IV (FOR PTA / DISCHARGE USE ONLY): 2.0000 g | INTRAVENOUS | 0 refills | Status: AC

## 2017-06-17 MED ORDER — GLUCERNA SHAKE PO LIQD: 237.0000 mL | Freq: Three times a day (TID) | ORAL | 0 refills | Status: AC

## 2017-06-17 MED ORDER — VANCOMYCIN HCL IN DEXTROSE 1-5 GM/200ML-% IV SOLN
1000.0000 mg | Freq: Two times a day (BID) | INTRAVENOUS | Status: DC
  Filled 2017-06-17: qty 200

## 2017-06-17 MED ORDER — ASPIRIN 81 MG PO TBEC: 81.0000 mg | DELAYED_RELEASE_TABLET | Freq: Every day | ORAL | Status: AC

## 2017-06-17 MED ORDER — ALBUTEROL SULFATE (2.5 MG/3ML) 0.083% IN NEBU: 2.5000 mg | INHALATION_SOLUTION | RESPIRATORY_TRACT | 12 refills | Status: AC | PRN

## 2017-06-17 NOTE — Clinical Social Work Placement (Signed)
Nurse to call report to [facility]  NOTE  Date:  06/17/2017  Patient Details  Name: Adrian Bean MRN: [id number] Date of Birth: [birth year]  Clinical Social Work is seeking post-discharge placement for this patient at the [place] level of care (*CSW will initial, date and re-position this form in  chart as items are completed):  Yes   Patient/family provided with [facility] Work Department's list of facilities offering this level of care within the geographic area requested by the patient (or if unable, by the patient's family).  Yes   Patient/family informed of their freedom to choose among providers that offer the needed level of care, that participate in Medicare, Medicaid or managed care program needed by the patient, have an available bed and are willing to accept the patient.  Yes   Patient/family informed of [facility]'s ownership interest in [facility] and [facility], as well as of the fact that they are under no obligation to receive care at these facilities.  PASRR submitted to [facility] on 06/16/17     PASRR number received on 06/16/17     Existing PASRR number confirmed on       FL2 transmitted to all facilities in geographic area requested by pt/family on 06/16/17     FL2 transmitted to all facilities within larger geographic area on       Patient informed that his/her managed care company has contracts with or will negotiate with certain facilities, including the following:        Yes   Patient/family informed of bed offers received.  Patient chooses bed at [facility] and [facility] recommends and patient chooses bed at      Patient to be transferred to [facility] on 06/17/17.  Patient to be transferred to facility by [facility]     Patient family notified on   of transfer.  Name of family member notified:        PHYSICIAN       Additional Comment:     _______________________________________________ Geralynn Ochs, LCSW 06/17/2017, 4:06 PM

## 2017-06-17 NOTE — Discharge Summary (Signed)
Physician Discharge Summary  Adrian Bean  SWN:[id number]  DOB: January 28, 1951  DOA: 06/02/2017 PCP: [name], MD  Admit date: 06/02/2017 Discharge date: 06/17/2017  Admitted From: Home  Disposition: Home   Recommendations for Outpatient Follow-up:  1. Follow up with SNF provider at earliest convenience  2. Please obtain BMP/CBC in one week to monitor Hgb and Cr  3. Please obtain CT chest in 4 weeks to assure resolution of multilobar pneumonia  4. IV abx 4 weeks Vancomycin and Rocephin end date 07/11/17 5. Can d/c wound vac in 1-2 days  6. Follow up with Dr. Sharol Given in 2 weeks  7. Wean O2 as tolerated  8. Need GI consult for colonoscopy as outpatient  9. Please obtain Thyroid US to monitor Thyroid nodule  10. Follow up with vascular surgery in 2 weeks Dr Early    Discharge Condition: Stable CODE STATUS: Full code  Diet recommendation: Heart Healthy / Carb Modified    Brief/Interim Summary: For full details see H&P/Progress notes but in brief, Adrian Bean is a 66 y/o M with with PMH of type II DM, neuropathy, PAD, left foot amputation, tobacco abuse; admitted on12/08/2016, presented with complaint of right leg wound on third toe, was found to have dry gangrene. Initially admitted at [facility] for cellulitis and gangrene and started on IV antibiotics. CTA showed extensive bilateral PVD and patient was transferred to [facility] after discussion with vascular surgery. Underwent bilateral angiogram and underwent for aortofemoral popliteal bypass and R 3rd toe amputation. Patient developed atelectasis and was placed on O2 supplementation subsequently CT chest showed pleural effusion with multilobar pneumonia. Paracentesis was attempted but unsuccessful. Follow up post op, patient with increase in WBC and R foot erythema. MRI does not show osteomyelitis, but there was concern for an abscess. Orthopedic surgery was consulted and recommended TMA. Patient underwent  transmetatarsal amputation on 12/14, wound vac was placed. Recommendations of 4 week IV antibiotics were made. PT evaluated patient and recommended SNF.   Subjective: Patient seen and examined, he continues to due well. Remain on O2 but saturation are good. Afebrile. Tolerating diet well. No acute events overnight.   Discharge Diagnoses/Hospital Course:  . Right third toe gangrene.  S/P amputation of the right third toe. 06/04/2017 Right femoral to popliteal bypass. 06/04/2017 Peripheral vascular disease. Sepsis secondary to cellulitis. 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 MRSA PCR is negative. Initially on IV vancomycin and Zosyn. Switch to Unasyn and stopped on 06/06/2017. Due to leukocytosis vascular surgery resumed antibiotics. Patient currently on IV doxy,  upon reviewing case with ID antibiotic recommendation are Vancomycin and Rocephin for 4 weeks   2. Type 2 diabetes mellitus. Hemoglobin A1c 6.3. Diabetic neuropathy. CBG's continues to be stable  Resume home medication with new changes   3. Tobacco abuse  Smoking cessation discussed   4. Hepatic flexure of the colon thickening. Incidentally noted on the CT scan. Will require outpatient GI follow-up with colonoscopy.  5. Acute hypoxic respiratory failure due to multifocal PNA  Wean O2 as tolerated to RA, patient does not use O2 at baseline. Vascular congestion noted on chest x-ray 12/9, repeated CXR 12/13 shows stable moderate R pleural effusion with atelectasis vs infiltrates - patient was on Unasyn - was switched to doxy, upon reviewing case with ID antibiotic recommendation are Vancomycin and Rocephin for 4 weeks  Treated with Lasix minimal response  and increased Cr so d/ced  CT shows Consolidation with air bronchogram in R middle and lowe lobe also left lower lobe consolidation with air  bronchogram and pleural effusion.  Thoracentesis attempted but effusions loculated, no able to drain.  Repeat CT in 4 weeks   6. Acute kidney injury.  - Resolved   Due to diuresis  D/c Lasix   Monitor for now   7. Right thyroid nodule  [place] as outpatient   All other chronic medical condition were stable during the hospitalization.  Patient was seen by physical therapy, recommending SNF  On the day of the discharge the patient's vitals were stable, and no other acute medical condition were reported by patient. the patient was felt safe to be discharge to SNF   Discharge Instructions  You were cared for by a hospitalist during your hospital stay. If you have any questions about your discharge medications or the care you received while you were in the hospital after you are discharged, you can call the unit and asked to speak with the hospitalist on call if the hospitalist that took care of you is not available. Once you are discharged, your primary care physician will handle any further medical issues. Please note that NO REFILLS for any discharge medications will be authorized once you are discharged, as it is imperative that you return to your primary care physician (or establish a relationship with a primary care physician if you do not have one) for your aftercare needs so that they can reassess your need for medications and monitor your lab values.  Discharge Instructions    Call MD for:  difficulty breathing, headache or visual disturbances   Complete by:  As directed    Call MD for:  extreme fatigue   Complete by:  As directed    Call MD for:  hives   Complete by:  As directed    Call MD for:  persistant dizziness or light-headedness   Complete by:  As directed    Call MD for:  persistant nausea and vomiting   Complete by:  As directed    Call MD for:  redness, tenderness, or signs of infection (pain, swelling, redness, odor or green/yellow discharge around incision site)    Complete by:  As directed    Call MD for:  severe uncontrolled pain   Complete by:  As directed    Call MD for:  temperature >100.4   Complete by:  As directed    Diet - low sodium heart healthy   Complete by:  As directed    Home infusion instructions Romeo May follow [place] Dosing Protocol; May administer Cathflo as needed to maintain patency of vascular access device.; Flushing of vascular access device: per [facility] Protocol: 0.9% NaCl pre/post medica...   Complete by:  As directed    Instructions:  May follow [place] Dosing Protocol   Instructions:  May administer Cathflo as needed to maintain patency of vascular access device.   Instructions:  Flushing of vascular access device: per [facility] Protocol: 0.9% NaCl pre/post medication administration and prn patency; Heparin 100 u/ml, 68m for implanted ports and Heparin 10u/ml, 562mfor all other central venous catheters.   Instructions:  May follow [facility] Anaphylaxis Protocol for First Dose Administration in the home: 0.9% NaCl at 25-50 ml/hr to maintain IV access for protocol meds. Epinephrine 0.3 ml IV/IM PRN and Benadryl 25-50 IV/IM PRN s/s of anaphylaxis.   Instructions:  [facility] Infusion  Coordinator (RN) to assist per patient IV care needs in the home PRN.   Increase activity slowly   Complete by:  As directed      Allergies as of 06/17/2017   No Known Allergies     Medication List    TAKE these medications   albuterol (2.5 MG/3ML) 0.083% nebulizer solution Commonly known as:  PROVENTIL Take 3 mLs (2.5 mg total) by nebulization every 4 (four) hours as needed for wheezing or shortness of breath.   aspirin 81 MG EC tablet Take 1 tablet (81 mg total) by mouth daily. Start taking on:  06/18/2017   atorvastatin 10 MG tablet Commonly known as:  LIPITOR Take 10 mg by mouth daily at 6 PM.   bisacodyl 10 MG suppository Commonly known as:  DULCOLAX Place 1 suppository (10 mg total) rectally daily as needed  for moderate constipation.   cefTRIAXone IVPB Commonly known as:  ROCEPHIN Inject 2 g into the vein daily for 24 days. Indication:  Presumed osteomyelitis Last Day of Therapy:  07/11/2017 Labs - Once weekly:  CBC/D and BMP, Labs - Every other week:  ESR and CRP   collagenase ointment Commonly known as:  SANTYL Apply topically daily. Start taking on:  06/18/2017   feeding supplement (GLUCERNA SHAKE) Liqd Take 237 mLs by mouth 3 (three) times daily between meals.   feeding supplement (PRO-STAT SUGAR FREE 64) Liqd Take 30 mLs by mouth 2 (two) times daily.   gabapentin 300 MG capsule Commonly known as:  NEURONTIN Take 600 mg by mouth 3 (three) times daily.   glimepiride 2 MG tablet Commonly known as:  AMARYL Take 2 mg by mouth daily with breakfast.   lisinopril 10 MG tablet Commonly known as:  PRINIVIL,ZESTRIL Take 10 mg by mouth daily.   multivitamin with minerals Tabs tablet Take 1 tablet by mouth daily.   nicotine 14 mg/24hr patch Commonly known as:  NICODERM CQ - dosed in mg/24 hours Place 1 patch (14 mg total) onto the skin daily. Start taking on:  06/18/2017   omega-3 acid ethyl esters 1 g capsule Commonly known as:  LOVAZA Take 1 g by mouth daily.   oxyCODONE-acetaminophen 7.5-325 MG tablet Commonly known as:  PERCOCET Take 1 tablet by mouth every 4 (four) hours as needed for moderate pain.   vancomycin IVPB Inject 1,000 mg into the vein every 12 (twelve) hours for 24 days. Indication:  Presumed osteomyelitis Last Day of Therapy:  07/11/2017 Labs - Sunday/Monday:  CBC/D, BMP, and vancomycin trough. Labs - Thursday:  BMP and vancomycin trough Labs - Every other week:  ESR and CRP            Home Infusion Instuctions  (From admission, onward)        Start     Ordered   06/17/17 0000  Home infusion instructions [facility] May follow [place] Dosing Protocol; May administer Cathflo as needed to maintain patency of vascular access device.;  Flushing of vascular access device: per [facility] Protocol: 0.9% NaCl pre/post medica...    Question Answer Comment  Instructions May follow [place] Dosing Protocol   Instructions May administer Cathflo as needed to maintain patency of vascular access device.   Instructions Flushing of vascular access device: per [facility] Protocol: 0.9% NaCl pre/post medication administration and prn patency; Heparin 100 u/ml, 9m for implanted ports and Heparin 10u/ml, 575mfor all other central venous catheters.   Instructions May follow [facility] Anaphylaxis Protocol for First Dose Administration in the  home: 0.9% NaCl at 25-50 ml/hr to maintain IV access for protocol meds. Epinephrine 0.3 ml IV/IM PRN and Benadryl 25-50 IV/IM PRN s/s of anaphylaxis.   Instructions [facility] In[facility] Coordinator (RN) to assist per patient IV care needs in the home PRN.      06/17/17 1526      Contact information for follow-up providers    [name], MD In 1 week.   Specialty:  Orthopedic Surgery Contact information: [place] [address] [phone]            Contact information for after-discharge care    Destination    [facility] .   Service:  Skilled Nursing Contact information: [address] [place] [address] [phone]                 No Known Allergies  Consultations:     Procedures/Studies: Dg Chest 1 View  Result Date: 06/16/2017 CLINICAL DATA:  Right thoracentesis EXAM: CHEST 1 VIEW COMPARISON:  06/14/2017 CT chest FINDINGS: Small bilateral pleural effusions, right greater than left. Bilateral diffuse interstitial thickening. No pneumothorax. Stable cardiomegaly. No acute osseous abnormality. IMPRESSION: No right pneumothorax status post thoracentesis. Mild CHF. Electronically Signed   By: Kathreen Devoid   On: 06/16/2017 14:06   Ct Chest Wo Contrast  Addendum Date: 06/14/2017   ADDENDUM REPORT: 06/14/2017 23:22  ADDENDUM: Addendum created to add an additional impression. IMPRESSION #7: Right thyroid nodule measuring 2.1 cm. Recommend further evaluation with thyroid ultrasound. Electronically Signed   By: Jeb Levering M.D.   On: 06/14/2017 23:22   Result Date: 06/14/2017 CLINICAL DATA:  Follow-up pleural effusion. EXAM: CT CHEST WITHOUT CONTRAST TECHNIQUE: Multidetector CT imaging of the chest was performed following the standard protocol without IV contrast. COMPARISON:  Radiographs earlier this day. Additional prior exams. Chest CT 07/03/2011 FINDINGS: Cardiovascular: Moderate multi chamber cardiomegaly. Coronary artery calcifications versus stents. Tortuous atherosclerotic aorta with moderate atherosclerosis. Ascending aorta measures 4.1 cm greatest dimension, unchanged from exam 5 years prior. Small volume pericardial fluid. Mediastinum/Nodes: Multiple small subcentimeter mediastinal nodes. Subcarinal node measures 15 mm short axis. Suspect prominent right hilar nodes, suboptimally assessed without IV contrast. The esophagus is decompressed. There is a 2.1 cm right thyroid nodule. Lungs/Pleura: Moderate emphysema. Near complete consolidation of the right lower lobe with air bronchograms. Consolidation throughout majority of the right middle lobe with air bronchograms. Small partially loculated right pleural effusion measures simple fluid density. Pleural fluid tracks in the fissure. Small nonspecific ground-glass opacity in the right upper lobe, image 83 series 4. Minimal debris/secretions in the right mainstem bronchus and bronchus intermedius. Dependent left lower lobe consolidation with air bronchograms. Small left pleural effusion. Upper Abdomen: Postcholecystectomy. No evidence of acute abnormality. Musculoskeletal: Degenerative change in the spine. There are no acute or suspicious osseous abnormalities. IMPRESSION: 1. Consolidation with air bronchograms throughout right middle and right lower lobe. This may  be airspace filling process such as pneumonia versus atelectasis. There is mild volume loss in the right lung favoring atelectasis. Underlying neoplastic etiology is not entirely excluded, no central obstructing lesion is visualized. Small partially loculated pleural effusion. 2. Left lower lobe consolidation with air bronchograms and adjacent small pleural effusions, similar appearance to process on the right but to a lesser extent. 3. Prominent subcarinal node and suspected right hilar adenopathy, likely reactive but nonspecific. 4. Moderate emphysema. 5. Aortic atherosclerosis with minimal aneurysmal dilatation of the ascending aorta measuring 4.1 cm. Recommend annual  imaging followup by CTA or MRA. This recommendation follows 2010 ACCF/AHA/AATS/ACR/ASA/SCA/SCAI/SIR/STS/SVM Guidelines for the Diagnosis and Management of Patients with Thoracic Aortic Disease. Circulation. 2010; 121: e266-e369 6. Cardiomegaly and coronary artery calcifications. Aortic Atherosclerosis (ICD10-I70.0) and Emphysema (ICD10-J43.9). Aortic aneurysm NOS (ICD10-I71.9). Electronically Signed: By: Jeb Levering M.D. On: 06/14/2017 21:51   Mr Foot Right W Wo Contrast  Result Date: 06/11/2017 CLINICAL DATA:  Dry gangrene of the right third toe. EXAM: MRI OF THE RIGHT FOREFOOT WITHOUT AND WITH CONTRAST TECHNIQUE: Multiplanar, multisequence MR imaging of the right forefoot was performed before and after the administration of intravenous contrast. CONTRAST:  76m MULTIHANCE GADOBENATE DIMEGLUMINE 529 MG/ML IV SOLN COMPARISON:  None. FINDINGS: Bones/Joint/Cartilage Third toe and portion of the third metatarsal head. No adjacent marrow edema of the third metatarsal to suggest osteomyelitis. Reactive marrow edema is noted of the second third toes without cortical bone destruction. No acute fracture or malalignment. Ligaments Intact given limitations of soft tissue edema. Muscles and Tendons The extensor and flexor tendons to the remaining  toes are intact and of normal signal intensity and morphology. Soft tissues There is an nonenhancing postop subcutaneous fluid collection measuring 4.8 x 1.7 x 1.3 cm along the plantar aspect of the distal second and third metatarsals tracking along the lateral aspect of the flexor digitorum longus and brevis tendons. IMPRESSION: 1. Status post amputation of the third toe and portion of the third metatarsal head without marrow signal abnormalities to indicate osteomyelitis of the third metatarsal. 2. There is mild reactive edema of the adjacent second and fourth toes without cortical bone destruction, fracture or joint dislocations. 3. Small nonenhancing plantar fluid collection of the forefoot measuring 4.8 x 1.7 x 1.3 cm likely postop in etiology. Electronically Signed   By: DAshley RoyaltyM.D.   On: 06/11/2017 12:47   Dg Chest Port 1 View  Result Date: 06/14/2017 CLINICAL DATA:  66y/o male with cough and shortness of breath. Pleural effusion. EXAM: PORTABLE CHEST 1 VIEW COMPARISON:  06/12/2017 and earlier. FINDINGS: Portable AP semi upright view at 0733 hours. Continued moderate to large veiling opacity in the right lower lung. No superimposed pneumothorax. Pulmonary vascularity appears stable and within normal limits. Stable visible mediastinal contours. No other confluent pulmonary opacity. Partially visible cervical ACDF hardware. IMPRESSION: 1. Moderate to large right pleural effusion suspected and stable. 2. No new cardiopulmonary abnormality. Electronically Signed   By: HGenevie AnnM.D.   On: 06/14/2017 08:00   Dg Chest Port 1 View  Result Date: 06/12/2017 CLINICAL DATA:  Shortness of breath. EXAM: PORTABLE CHEST 1 VIEW COMPARISON:  Radiograph of June 08, 2017. FINDINGS: Stable cardiomediastinal silhouette. No pneumothorax is noted. Mild interstitial densities are noted in the left lung base which may represent edema or inflammation. Atherosclerosis of thoracic aorta is noted. Stable moderate right  pleural effusion is noted with associated atelectasis or infiltrate. Bony thorax is unremarkable. IMPRESSION: Aortic atherosclerosis. Stable moderate right pleural effusion with associated atelectasis or infiltrate. Mild interstitial densities are noted in left lung base which may represent edema or inflammation. Electronically Signed   By: JMarijo Conception M.D.   On: 06/12/2017 12:18   Dg Chest Port 1 View  Result Date: 06/08/2017 CLINICAL DATA:  Cough, history peripheral arterial disease, smoker, type II diabetes mellitus with diabetic neuropathy EXAM: PORTABLE CHEST 1 VIEW COMPARISON:  Portable exam 1012 hours compared to 06/05/2017 FINDINGS: Upper normal heart size with mild pulmonary vascular congestion. Atherosclerotic calcification aorta. Mediastinal contours normal. Increased RIGHT pleural  effusion and basilar atelectasis versus consolidation. Persistent interstitial prominence question mild pulmonary edema. No pneumothorax. Bones demineralized with evidence of prior cervical spine fusion. IMPRESSION: Pulmonary vascular congestion with perihilar interstitial infiltrates question pulmonary edema. Increased RIGHT pleural effusion and atelectasis versus consolidation of the lower RIGHT lung. Electronically Signed   By: Lavonia Dana M.D.   On: 06/08/2017 10:28   Dg Chest Port 1 View  Result Date: 06/05/2017 CLINICAL DATA:  Small bowel obstruction. History of diabetes, current smoker. EXAM: PORTABLE CHEST 1 VIEW COMPARISON:  None in PACs FINDINGS: The left lung is well-expanded. On the right there is mild volume loss and apparent mild elevation of the hemidiaphragm. There is patchy alveolar opacity at the right lung base. The interstitial markings of both lungs are increased. The pulmonary vascularity is mildly engorged. The cardiac silhouette is top-normal in size. There is calcification in the wall of the aortic arch. IMPRESSION: COPD. Right basilar atelectasis or pneumonia. Moderate pulmonary  interstitial edema of cardiac or noncardiac cause. Electronically Signed   By: David  Martinique M.D.   On: 06/05/2017 11:16   Ir Thoracentesis Asp Pleural Space W/img Guide  Result Date: 06/16/2017 INDICATION: Shortness of breath. Right-sided pleural effusion. Patient with history of prior complex pleural effusion requiring multiple large bore chest tubes (possible VATS procedure). Request diagnostic and therapeutic thoracentesis. EXAM: ATTEMPTED ULTRASOUND GUIDED RIGHT THORACENTESIS MEDICATIONS: None. COMPARISON:  Chest CT - 09/32/3557 COMPLICATIONS: None immediate. PROCEDURE: An ultrasound guided thoracentesis was thoroughly discussed with the patient and questions answered. The benefits, risks, alternatives and complications were also discussed. The patient understands and wishes to proceed with the procedure. Written consent was obtained. Ultrasound of the right chest demonstrates a trace, but densely loculated appearing effusion. Decision was made to proceed with attempted thoracentesis. The area was then prepped and draped in the normal sterile fashion. 1% Lidocaine was used for local anesthesia. Under ultrasound guidance a Safe-T-Centesis catheter was introduced. Despite visualization of the catheter within the presumed fluid collection, no fluid could be aspirated. The catheter was removed and a dressing applied. FINDINGS: Trace loculated right-sided pleural effusion. Attempted ultrasound-guided right-sided thoracentesis yielded no pleural fluid. IMPRESSION: Trace loculated right-sided pleural effusion. Attempted ultrasound guided right thoracentesis yielding no pleural fluid. Read by: Ascencion Dike PA-C Electronically Signed   By: Sandi Mariscal M.D.   On: 06/16/2017 14:13    Discharge Exam: Vitals:   06/16/17 2000 06/17/17 0400  BP: 118/71 131/78  Pulse: 68 74  Resp: 13 12  Temp: 98.4 F (36.9 C) 97.7 F (36.5 C)  SpO2: 100% 92%   Vitals:   06/16/17 1333 06/16/17 1653 06/16/17 2000 06/17/17  0400  BP: 106/65 128/77 118/71 131/78  Pulse:   68 74  Resp:  18 13 12   Temp:  97.9 F (36.6 C) 98.4 F (36.9 C) 97.7 F (36.5 C)  TempSrc:  Oral Oral Oral  SpO2:  99% 100% 92%  Weight:      Height:        General: Pt is alert, awake, not in acute distress Cardiovascular: RRR, S1/S2 +, no rubs, no gallops Respiratory: Breath sound diminished, no wheezing  Extremities: TMA with wound vac in place    The results of significant diagnostics from this hospitalization (including imaging, microbiology, ancillary and laboratory) are listed below for reference.     Microbiology: Recent Results (from the past 240 hour(s))  Aerobic/Anaerobic Culture (surgical/deep wound)     Status: None (Preliminary result)   Collection Time: 06/13/17  2:38 PM  Result Value Ref Range Status   Specimen Description ABSCESS RIGHT FOOT  Final   Special Requests NONE  Final   Gram Stain   Final    FEW WBC PRESENT,BOTH PMN AND MONONUCLEAR NO ORGANISMS SEEN    Culture   Final    NO GROWTH 4 DAYS NO ANAEROBES ISOLATED; CULTURE IN PROGRESS FOR 5 DAYS   Report Status PENDING  Incomplete     Labs: BNP (last 3 results) No results for input(s): BNP in the last 8760 hours. Basic Metabolic Panel: Recent Labs  Lab 06/11/17 0252 06/12/17 0310 06/13/17 0740 06/14/17 1832 06/16/17 0235  NA 138 137 136 132* 135  K 4.4 4.3 4.2 4.4 3.9  CL 102 101 100* 97* 96*  CO2 30 29 30 29 31   GLUCOSE 146* 151* 152* 139* 155*  BUN 27* 26* 24* 25* 23*  CREATININE 1.17 1.15 1.16 1.37* 1.21  CALCIUM 8.7* 8.6* 8.5* 8.2* 8.6*   Liver Function Tests: Recent Labs  Lab 06/12/17 0310 06/16/17 0235  AST  --  17  ALT  --  14*  ALKPHOS  --  123  BILITOT  --  0.7  PROT  --  6.1*  ALBUMIN 1.8* 1.9*   No results for input(s): LIPASE, AMYLASE in the last 168 hours. No results for input(s): AMMONIA in the last 168 hours. CBC: Recent Labs  Lab 06/11/17 0252 06/12/17 0310 06/13/17 0740 06/16/17 0235  WBC 14.2* 13.9*  11.3* 8.8  NEUTROABS 10.0* 11.1* 8.4* 6.2  HGB 9.5* 9.2* 9.2* 9.4*  HCT 30.1* 29.5* 28.9* 29.9*  MCV 95.9 95.8 97.0 96.1  PLT 415* 396 388 334   Cardiac Enzymes: No results for input(s): CKTOTAL, CKMB, CKMBINDEX, TROPONINI in the last 168 hours. BNP: Invalid input(s): POCBNP CBG: Recent Labs  Lab 06/16/17 1144 06/16/17 1614 06/16/17 2103 06/17/17 0632 06/17/17 1147  GLUCAP 138* 124* 116* 124* 160*   D-Dimer No results for input(s): DDIMER in the last 72 hours. Hgb A1c No results for input(s): HGBA1C in the last 72 hours. Lipid Profile No results for input(s): CHOL, HDL, LDLCALC, TRIG, CHOLHDL, LDLDIRECT in the last 72 hours. Thyroid function studies No results for input(s): TSH, T4TOTAL, T3FREE, THYROIDAB in the last 72 hours.  Invalid input(s): FREET3 Anemia work up No results for input(s): VITAMINB12, FOLATE, FERRITIN, TIBC, IRON, RETICCTPCT in the last 72 hours. Urinalysis    Component Value Date/Time   COLORURINE AMBER (A) 06/06/2017 1601   APPEARANCEUR CLOUDY (A) 06/06/2017 1601   LABSPEC 1.026 06/06/2017 1601   PHURINE 5.0 06/06/2017 1601   GLUCOSEU NEGATIVE 06/06/2017 1601   HGBUR MODERATE (A) 06/06/2017 1601   BILIRUBINUR NEGATIVE 06/06/2017 1601   KETONESUR NEGATIVE 06/06/2017 1601   PROTEINUR 100 (A) 06/06/2017 1601   NITRITE NEGATIVE 06/06/2017 1601   LEUKOCYTESUR TRACE (A) 06/06/2017 1601   Sepsis Labs Invalid input(s): PROCALCITONIN,  WBC,  LACTICIDVEN Microbiology Recent Results (from the past 240 hour(s))  Aerobic/Anaerobic Culture (surgical/deep wound)     Status: None (Preliminary result)   Collection Time: 06/13/17  2:38 PM  Result Value Ref Range Status   Specimen Description ABSCESS RIGHT FOOT  Final   Special Requests NONE  Final   Gram Stain   Final    FEW WBC PRESENT,BOTH PMN AND MONONUCLEAR NO ORGANISMS SEEN    Culture   Final    NO GROWTH 4 DAYS NO ANAEROBES ISOLATED; CULTURE IN PROGRESS FOR 5 DAYS   Report Status PENDING   Incomplete     Time  coordinating discharge: 40 minutes  SIGNED:  Chipper Oman, MD  [facility] 06/17/2017, 3:27 PM  Pager please text page via  [email or web address] Password [email or web address]

## 2017-06-17 NOTE — Care Management Note (Signed)
Case Management Note Marvetta Gibbons RN, BSN Unit 4E-Case Manager [phone]  Patient Details  Name: Adrian Bean MRN: [id number] Date of Birth: July 29, 1950  Subjective/Objective:  Pt admitted with gangrene of toe- s/p fempop bypass with 3rd toe amputation                   Action/Plan: PTA pt lived at home- per PT/OT eval HH recommended- would need [place] orders prior to discharge- no DME recommended- CM to follow for orders and transition to home needs.   Expected Discharge Date:  06/17/17               Expected Discharge Plan:  [place]  In-House Referral:  Clinical Social Work  Discharge planning Services  CM Consult  Post Acute Care Choice:  Durable Medical Equipment Choice offered to:     DME Arranged:    DME Agency:     HH Arranged:    [place] Agency:     Status of Service:  Completed, signed off  If discussed at [employer] of Stay Meetings, dates discussed:  12/18  Discharge Disposition: skilled facility   Additional Comments:  06/17/17- 1530- Marvetta Gibbons RN, MC- pt for d/c today to [facility] in [place]- pt s/p TMA on 12/14- will need 4 weeks of IV abx- PICC line has been placed prior to discharge- CSW following for placement needs.   Dawayne Patricia, RN 06/17/2017, 3:28 PM

## 2017-06-17 NOTE — Progress Notes (Signed)
Report attempted to [facility]. Given voicemail. Left message. Will continue to monitor.

## 2017-06-17 NOTE — Progress Notes (Signed)
Patient ID: Adrian Bean, male   DOB: 13-May-1951, 66 y.o.   MRN: [id number] Status post revascularization and transmetatarsal amputation right foot.  Wound VAC is clean and dry.  Anticipate discharge to skilled nursing and wound VAC can be removed at time of discharge.  Patient will need at least 4 weeks of  antibiotics at discharge there was infection at the level of the amputation.  Wound cultures negative to date.

## 2017-06-17 NOTE — Progress Notes (Signed)
Peripherally Inserted Central Catheter/Midline Placement  The IV Nurse has discussed with the patient and/or persons authorized to consent for the patient, the purpose of this procedure and the potential benefits and risks involved with this procedure.  The benefits include less needle sticks, lab draws from the catheter, and the patient may be discharged home with the catheter. Risks include, but not limited to, infection, bleeding, blood clot (thrombus formation), and puncture of an artery; nerve damage and irregular heartbeat and possibility to perform a PICC exchange if needed/ordered by physician.  Alternatives to this procedure were also discussed.  Bard Power PICC patient education guide, fact sheet on infection prevention and patient information card has been provided to patient /or left at bedside.    PICC/Midline Placement Documentation  PICC Single Lumen 20/10/07 PICC Right Basilic 41 cm 0 cm (Active)  Indication for Insertion or Continuance of Line Home intravenous therapies (PICC only) 06/17/2017  2:22 PM  Exposed Catheter (cm) 0 cm 06/17/2017  2:22 PM  Site Assessment Clean;Dry;Intact 06/17/2017  2:22 PM  Line Status Flushed;Saline locked;Blood return noted 06/17/2017  2:22 PM  Dressing Type Transparent 06/17/2017  2:22 PM  Dressing Status Clean;Dry;Intact 06/17/2017  2:22 PM  Dressing Change Due 06/24/17 06/17/2017  2:22 PM       [facility] Payment 06/17/2017, 2:23 PM

## 2017-06-17 NOTE — Progress Notes (Signed)
PHARMACY CONSULT NOTE FOR:  OUTPATIENT  PARENTERAL ANTIBIOTIC THERAPY (OPAT)  Indication: presumed osteo Regimen: Vancomycin 1g IV q12h + Ceftriaxone 2g IV q24h End date: 07/11/2017  IV antibiotic discharge orders are pended. To discharging provider:  please sign these orders via discharge navigator,  Select New Orders & click on the button choice - Manage This Unsigned Work.     Elicia Lamp, PharmD, BCPS Clinical Pharmacist Clinical phone for 06/17/2017 until 3:30pm: [phone] If after 3:30pm, please call main pharmacy at: [phone] 06/17/2017 12:05 PM

## 2017-06-17 NOTE — Progress Notes (Addendum)
Pharmacy Antibiotic Note  Adrian Bean is a 66 y.o. male admitted on 06/02/2017 with likely osteomyelitis.  Pharmacy has been consulted for vancomycin/ceftriaxone dosing per ID recommendations based on intra-operative findings, to continue through 07/11/2017 per discussion with Dr. Quincy Simmonds. Patient to discharge to SNF today. Afebrile, WBC wnl. SCr down to 1.21 on 12/17, CrCL~60-65.  Plan: D/c doxy per ID Vancomycin 1750mg  IV x 1; then 1g IV q12h Ceftriaxone 2g IV q24h Monitor clinical progress, c/s, renal function, vancomycin trough at steady state OPAT completed 12/18 - end date 07/11/2017  Height: 6' (182.9 cm) Weight: 189 lb 14.4 oz (86.1 kg) IBW/kg (Calculated) : 77.6  Temp (24hrs), Avg:98 F (36.7 C), Min:97.7 F (36.5 C), Max:98.4 F (36.9 C)  Recent Labs  Lab 06/11/17 0252 06/12/17 0310 06/13/17 0740 06/14/17 1832 06/16/17 0235  WBC 14.2* 13.9* 11.3*  --  8.8  CREATININE 1.17 1.15 1.16 1.37* 1.21    Estimated Creatinine Clearance: 64.1 mL/min (by C-G formula based on SCr of 1.21 mg/dL).    No Known Allergies   CTX 12/18 >> (07/11/2017) Vanc 12/2 at [facility] >>12/6; 12/18 >> (07/11/2017) Zosyn 12/2 at [facility] >>12/6 Unasyn 12/6 >>12/8 Doxy 12/13 >> 12/18  MRSA PCR 12/3: negative 12/14 wound cx: ngtd   Elicia Lamp, PharmD, BCPS Clinical Pharmacist Clinical phone for 06/17/2017 until 3:30pm: [phone] If after 3:30pm, please call main pharmacy at: [phone] 06/17/2017 11:53 AM

## 2017-06-18 ENCOUNTER — Encounter (HOSPITAL_COMMUNITY): Payer: Self-pay

## 2017-06-18 LAB — AEROBIC/ANAEROBIC CULTURE (SURGICAL/DEEP WOUND): CULTURE: NO GROWTH

## 2017-06-18 LAB — AEROBIC/ANAEROBIC CULTURE W GRAM STAIN (SURGICAL/DEEP WOUND)

## 2017-06-19 ENCOUNTER — Telehealth (INDEPENDENT_AMBULATORY_CARE_PROVIDER_SITE_OTHER): Payer: Self-pay | Admitting: Radiology

## 2017-06-19 ENCOUNTER — Telehealth: Payer: Self-pay | Admitting: Vascular Surgery

## 2017-06-19 NOTE — Telephone Encounter (Signed)
-----   Message from Mena Goes, RN sent at 06/19/2017 11:23 AM EST ----- Regarding: 2 weeks w/ TFE   ----- Message ----- From: Iline Oven Sent: 06/19/2017   8:01 AM To: [facility] Charge Pool  Can you schedule this pt for an appt with Dr. Donnetta Hutching in about 2 weeks.  PO R fem to AK pop bypass with GSV conduit. Thanks, [facility]

## 2017-06-19 NOTE — Telephone Encounter (Signed)
Adrian Bean needs to schedule a postop for patient, please call her to sched with Sharol Given. Thanks

## 2017-06-19 NOTE — Telephone Encounter (Signed)
Sched appt 07/08/17 at 3:15. Pt# wrong #, lm on emerg contact's #.

## 2017-06-19 NOTE — Telephone Encounter (Signed)
Returned call and left voicemail to call back to schedule.

## 2017-06-26 ENCOUNTER — Encounter (INDEPENDENT_AMBULATORY_CARE_PROVIDER_SITE_OTHER): Payer: Self-pay | Admitting: Surgery

## 2017-06-26 ENCOUNTER — Ambulatory Visit (INDEPENDENT_AMBULATORY_CARE_PROVIDER_SITE_OTHER): Payer: BC Managed Care – PPO | Admitting: Surgery

## 2017-06-26 VITALS — BP 153/87 | HR 74 | Ht 72.0 in | Wt 177.0 lb

## 2017-06-26 DIAGNOSIS — Z89439 Acquired absence of unspecified foot: Secondary | ICD-10-CM

## 2017-06-26 MED ORDER — OXYCODONE-ACETAMINOPHEN 10-325 MG PO TABS: 1.0000 | ORAL_TABLET | Freq: Four times a day (QID) | ORAL | 0 refills | Status: DC | PRN

## 2017-06-26 NOTE — Progress Notes (Signed)
Post-Op Visit Note   Patient: Adrian Bean           Date of Birth: 06-04-1951           MRN: [id number] Visit Date: 06/26/2017 PCP: [name], MD   Assessment & Plan:  Chief Complaint:  Chief Complaint  Patient presents with  . Right Foot - Routine Post Op  Patient returns. Status post transmetatarsal dictation. He is complaining of left foot pain. Admits to the facility not properly elevating his foot well above heart level. Does have PICC line. Visit Diagnoses:  1. History of transmetatarsal amputation of foot ([place])     Plan: Advised patient and staff member from facility that left foot must be elevated well above heart level. Nonweightbearing left foot. Continue antibiotics. Given prescription for Percocet 10/325.Marland Kitchen Discontinue 5/325. Follow-up with Dr. Sharol Given in 1 week for recheck.   Follow-Up Instructions: Return in about 1 week (around 07/03/2017) for dr duda.   Orders:  No orders of the defined types were placed in this encounter.  Meds ordered this encounter  Medications  . oxyCODONE-acetaminophen (PERCOCET) 10-325 MG tablet    Sig: Take 1 tablet by mouth every 6 (six) hours as needed for pain.    Dispense:  40 tablet    Refill:  0    Imaging: No results found.  PMFS History: Patient Active Problem List   Diagnosis Date Noted  . PAD (peripheral artery disease) ([place]) 06/02/2017  . Gangrene of toe of right foot ([place]) 06/02/2017  . Sepsis ([place]) 06/02/2017  . DM (diabetes mellitus), type 2 ([place]) 06/02/2017  . Tobacco abuse 06/02/2017  . Pain in limb 10/21/2011  . Swelling of limb 10/21/2011   Past Medical History:  Diagnosis Date  . Dermatophytosis, nail   . Diabetes mellitus    type 2  . Diabetic neuropathy ([place])    [name] 06/02/2017  . Diffuse connective tissue disease ([place])   . High cholesterol   . Hypertension   . PAD (peripheral artery disease) ([place]) 06/02/2017  . Peripheral vascular disease ([place])   . Thyroid disorder   . Tobacco abuse  06/02/2017  . Type II diabetes mellitus ([place]) 06/02/2017    Family History  Adopted: Yes    Past Surgical History:  Procedure Laterality Date  . ABDOMINAL AORTAGRAM N/A 10/30/2011   Procedure: ABDOMINAL Maxcine Ham;  Surgeon: Serafina Mitchell, MD;  Location: [facility] CATH LAB;  Service: Cardiovascular;  Laterality: N/A;  . ABDOMINAL AORTOGRAM N/A 06/03/2017   Procedure: ABDOMINAL AORTOGRAM;  Surgeon: Conrad [place], MD;  Location: [place] CV LAB;  Service: Cardiovascular;  Laterality: N/A;  . AMPUTATION Right 06/04/2017   Procedure: AMPUTATION 3rd toeDIGIT;  Surgeon: Rosetta Posner, MD;  Location: [place];  Service: Vascular;  Laterality: Right;  . AMPUTATION Right 06/13/2017   Procedure: RIGHT TRANSMETATARSAL AMPUTATION;  Surgeon: [name], MD;  Location: [place];  Service: Orthopedics;  Laterality: Right;  . CHOLECYSTECTOMY  06-20-2011  . CHOLECYSTECTOMY    . FEMORAL-POPLITEAL BYPASS GRAFT Right 06/04/2017   Procedure: BYPASS  FEMORAL-POPLITEAL ARTERY USING GREATER SAPHENOUS VEIN;  Surgeon: Rosetta Posner, MD;  Location: [place] [facility];  Service: Vascular;  Laterality: Right;  . FOOT RAY RESECTION Left 2018   hx fourth and fifth ray amputation/notes 06/02/2017  . IR ANGIOGRAM EXTREMITY LEFT  01/17/2017  . IR FEM POP ART PTA MOD SED  01/17/2017  . IR ILIAC ART STENT INC PTA EA ADD IPSILAT MOD SED  01/17/2017  .  IR ILIAC ART STENT INC PTA MOD SED  01/17/2017  . IR THORACENTESIS ASP PLEURAL SPACE W/IMG GUIDE  06/16/2017  . IR US GUIDE VASC ACCESS LEFT  01/17/2017  . IR US GUIDE VASC ACCESS RIGHT  01/17/2017  . LOWER EXTREMITY ANGIOGRAPHY Bilateral 06/03/2017   Procedure: Lower Extremity Angiography;  Surgeon: Conrad Anchorage, MD;  Location: [place] CV LAB;  Service: Cardiovascular;  Laterality: Bilateral;  . PERIPHERAL VASCULAR CATHETERIZATION Left 12/2016   angioplasty of iliac artery  hx/notes 06/02/2017  . VEIN HARVEST Right 06/04/2017   Procedure: VEIN HARVEST RIGHT SAPHENOUS VEIN;  Surgeon: Rosetta Posner,  MD;  [facility];  Service: Vascular;  Laterality: Right;   Social History   Occupational History  . Not on file  Tobacco Use  . Smoking status: Current Every Day Smoker    Packs/day: 0.50    Years: 50.00    Pack years: 25.00    Types: Cigarettes  . Smokeless tobacco: Never Used  . Tobacco comment: pt states that he is not addicted, and it's not a big deal to quit  Substance and Sexual Activity  . Alcohol use: No    Frequency: Never  . Drug use: No  . Sexual activity: Not Currently   Exam Pleasant white male alert and oriented in no acute distress. Patient does have serous drainage from his wound. Sutures are intact. Foot is moderately swollen.

## 2017-07-04 ENCOUNTER — Ambulatory Visit (INDEPENDENT_AMBULATORY_CARE_PROVIDER_SITE_OTHER): Payer: BC Managed Care – PPO | Admitting: Family

## 2017-07-04 ENCOUNTER — Encounter (INDEPENDENT_AMBULATORY_CARE_PROVIDER_SITE_OTHER): Payer: Self-pay | Admitting: Family

## 2017-07-04 DIAGNOSIS — Z89431 Acquired absence of right foot: Secondary | ICD-10-CM | POA: Insufficient documentation

## 2017-07-04 NOTE — Progress Notes (Signed)
Post-Op Visit Note   Patient: Adrian Bean           Date of Birth: [birth year]           MRN: [id number] Visit Date: 07/04/2017 PCP: [name], MD  Chief Complaint:  Chief Complaint  Patient presents with  . Right Foot - Routine Post Op    HPI:  HPI The patient is a 67 year old gentleman seen today status post right transmetatarsal amputation on 06/13/17. Residing at skilled nursing. Complains that ace wraps are causing swelling and pain. States elevates all the time. Today foot dependent, patient in wheel chair.  Ortho Exam Incision approximated with sutures. Ischemic changes along incision. No dehiscence. There is surrounding maceration. Mild serous drainage. Swelling with dependent erythema. No sign of active infection.  Visit Diagnoses: No diagnosis found.  Plan: follow up in office in 2 weeks. Daily incisional cleansing and apply dry dressings. Discussed importance of compression and elevation for wound healing. No weight bearing.   Follow-Up Instructions: No Follow-up on file.   Imaging: No results found.  Orders:  No orders of the defined types were placed in this encounter.  No orders of the defined types were placed in this encounter.    PMFS History: Patient Active Problem List   Diagnosis Date Noted  . PAD (peripheral artery disease) ([place]) 06/02/2017  . Gangrene of toe of right foot ([place]) 06/02/2017  . Sepsis ([place]) 06/02/2017  . DM (diabetes mellitus), type 2 ([place]) 06/02/2017  . Tobacco abuse 06/02/2017  . Pain in limb 10/21/2011  . Swelling of limb 10/21/2011   Past Medical History:  Diagnosis Date  . Dermatophytosis, nail   . Diabetes mellitus    type 2  . Diabetic neuropathy ([place])    [name] 06/02/2017  . Diffuse connective tissue disease ([place])   . High cholesterol   . Hypertension   . PAD (peripheral artery disease) ([place]) 06/02/2017  . Peripheral vascular disease ([place])   . Thyroid disorder   . Tobacco abuse 06/02/2017  . Type II  diabetes mellitus ([place]) 06/02/2017    Family History  Adopted: Yes    Past Surgical History:  Procedure Laterality Date  . ABDOMINAL AORTAGRAM N/A 10/30/2011   Procedure: ABDOMINAL Maxcine Ham;  Surgeon: Serafina Mitchell, MD;  Location: [facility] CATH LAB;  Service: Cardiovascular;  Laterality: N/A;  . ABDOMINAL AORTOGRAM N/A 06/03/2017   Procedure: ABDOMINAL AORTOGRAM;  Surgeon: Conrad Walcott, MD;  Location: [place] CV LAB;  Service: Cardiovascular;  Laterality: N/A;  . AMPUTATION Right 06/04/2017   Procedure: AMPUTATION 3rd toeDIGIT;  Surgeon: Rosetta Posner, MD;  Location: [place];  Service: Vascular;  Laterality: Right;  . AMPUTATION Right 06/13/2017   Procedure: RIGHT TRANSMETATARSAL AMPUTATION;  Surgeon: [name], MD;  Location: [place];  Service: Orthopedics;  Laterality: Right;  . CHOLECYSTECTOMY  06-20-2011  . CHOLECYSTECTOMY    . FEMORAL-POPLITEAL BYPASS GRAFT Right 06/04/2017   Procedure: BYPASS  FEMORAL-POPLITEAL ARTERY USING GREATER SAPHENOUS VEIN;  Surgeon: Rosetta Posner, MD;  Location: [place];  Service: Vascular;  Laterality: Right;  . FOOT RAY RESECTION Left 2018   hx fourth and fifth ray amputation/notes 06/02/2017  . IR ANGIOGRAM EXTREMITY LEFT  01/17/2017  . IR FEM POP ART PTA MOD SED  01/17/2017  . IR ILIAC ART STENT INC PTA EA ADD IPSILAT MOD SED  01/17/2017  . IR ILIAC ART STENT INC PTA MOD SED  01/17/2017  . IR THORACENTESIS ASP PLEURAL SPACE  W/IMG GUIDE  06/16/2017  . IR US GUIDE VASC ACCESS LEFT  01/17/2017  . IR US GUIDE VASC ACCESS RIGHT  01/17/2017  . LOWER EXTREMITY ANGIOGRAPHY Bilateral 06/03/2017   Procedure: Lower Extremity Angiography;  Surgeon: Conrad Lakeland, MD;  Location: [place] CV LAB;  Service: Cardiovascular;  Laterality: Bilateral;  . PERIPHERAL VASCULAR CATHETERIZATION Left 12/2016   angioplasty of iliac artery  hx/notes 06/02/2017  . VEIN HARVEST Right 06/04/2017   Procedure: VEIN HARVEST RIGHT SAPHENOUS VEIN;  Surgeon: Rosetta Posner, MD;  Location: [facility] OR;   Service: Vascular;  Laterality: Right;   Social History   Occupational History  . Not on file  Tobacco Use  . Smoking status: Current Every Day Smoker    Packs/day: 0.50    Years: 50.00    Pack years: 25.00    Types: Cigarettes  . Smokeless tobacco: Never Used  . Tobacco comment: pt states that he is not addicted, and it's not a big deal to quit  Substance and Sexual Activity  . Alcohol use: No    Frequency: Never  . Drug use: No  . Sexual activity: Not Currently

## 2017-07-08 ENCOUNTER — Encounter: Payer: Self-pay | Admitting: Vascular Surgery

## 2017-07-08 ENCOUNTER — Ambulatory Visit (INDEPENDENT_AMBULATORY_CARE_PROVIDER_SITE_OTHER): Payer: Self-pay | Admitting: Vascular Surgery

## 2017-07-08 VITALS — BP 115/75 | HR 70 | Temp 99.8°F | Resp 16 | Ht 72.0 in | Wt 177.0 lb

## 2017-07-08 DIAGNOSIS — I739 Peripheral vascular disease, unspecified: Secondary | ICD-10-CM

## 2017-07-08 NOTE — Progress Notes (Signed)
Patient name: Adrian Bean MRN: [id number] DOB: 1951/03/04 Sex: male  REASON FOR VISIT: Follow-up recent right femoral to popliteal bypass  HPI: Adrian Bean is a 67 y.o. male follow-up.  He presented with gangrene of his right third toe and metatarsal head.  He underwent right femoral to above-knee popliteal bypass on 06/04/2017 and right third toe amputation.  He had persistent gangrenous changes and nonhealing amputation site.  He was taken back to the operating room by Dr. Sharol Given and underwent a transmetatarsal amputation on the right.  He was discharged to skilled nursing facility.  No discomfort from his vein harvest her bypass site.  This had complete healing of his incision does have some discomfort from his transmetatarsal amputation  Current Outpatient Medications  Medication Sig Dispense Refill  . albuterol (PROVENTIL) (2.5 MG/3ML) 0.083% nebulizer solution Take 3 mLs (2.5 mg total) by nebulization every 4 (four) hours as needed for wheezing or shortness of breath. 75 mL 12  . Amino Acids-Protein Hydrolys (FEEDING SUPPLEMENT, PRO-STAT SUGAR FREE 64,) LIQD Take 30 mLs by mouth 2 (two) times daily. 900 mL 0  . aspirin EC 81 MG EC tablet Take 1 tablet (81 mg total) by mouth daily.    Marland Kitchen atorvastatin (LIPITOR) 10 MG tablet Take 10 mg by mouth daily.    Marland Kitchen atorvastatin (LIPITOR) 10 MG tablet Take 10 mg by mouth daily at 6 PM.    . bisacodyl (DULCOLAX) 10 MG suppository Place 1 suppository (10 mg total) rectally daily as needed for moderate constipation. 12 suppository 0  . cefTRIAXone (ROCEPHIN) IVPB Inject 2 g into the vein daily for 24 days. Indication:  Presumed osteomyelitis Last Day of Therapy:  07/11/2017 Labs - Once weekly:  CBC/D and BMP, Labs - Every other week:  ESR and CRP 24 Units 0  . clopidogrel (PLAVIX) 75 MG tablet Take 75 mg by mouth daily.    . collagenase (SANTYL) ointment Apply topically daily. 15 g 0  . feeding supplement, GLUCERNA  SHAKE, (GLUCERNA SHAKE) LIQD Take 237 mLs by mouth 3 (three) times daily between meals.  0  . gabapentin (NEURONTIN) 300 MG capsule Take 300 mg by mouth 3 (three) times daily.    Marland Kitchen gabapentin (NEURONTIN) 300 MG capsule Take 600 mg by mouth 3 (three) times daily.    Marland Kitchen glimepiride (AMARYL) 2 MG tablet Take 2 mg by mouth daily with breakfast.    . glimepiride (AMARYL) 2 MG tablet Take 2 mg by mouth daily with breakfast.    . HEPARIN LOCK FLUSH IV Inject into the vein.    Marland Kitchen HYDROcodone-acetaminophen (NORCO) 10-325 MG tablet Take 1 tablet by mouth every 6 (six) hours as needed for moderate pain.    Marland Kitchen lisinopril (PRINIVIL,ZESTRIL) 10 MG tablet Take 10 mg by mouth daily.    Marland Kitchen lisinopril (PRINIVIL,ZESTRIL) 10 MG tablet Take 10 mg by mouth daily.    . Multiple Vitamin (MULTIVITAMIN WITH MINERALS) TABS tablet Take 1 tablet by mouth daily.    . multivitamin-iron-minerals-folic acid (CENTRUM) chewable tablet Chew 1 tablet by mouth daily.    . nicotine (NICODERM CQ - DOSED IN MG/24 HOURS) 14 mg/24hr patch Place 1 patch (14 mg total) onto the skin daily. 28 patch 0  . omega-3 acid ethyl esters (LOVAZA) 1 g capsule Take 1 g by mouth daily.    . Omega-3 Fatty Acids (FISH OIL PO) Take 1 tablet by mouth daily.    Marland Kitchen oxyCODONE-acetaminophen (PERCOCET) 10-325 MG tablet Take 1 tablet by mouth every 6 (  six) hours as needed for pain. 40 tablet 0  . polyethylene glycol (MIRALAX / GLYCOLAX) packet Take 17 g by mouth daily as needed for mild constipation.    . terbinafine (LAMISIL) 250 MG tablet Take 250 mg by mouth daily.    . vancomycin IVPB Inject 1,000 mg into the vein every 12 (twelve) hours for 24 days. Indication:  Presumed osteomyelitis Last Day of Therapy:  07/11/2017 Labs - Sunday/Monday:  CBC/D, BMP, and vancomycin trough. Labs - Thursday:  BMP and vancomycin trough Labs - Every other week:  ESR and CRP 48 Units 0   No current facility-administered medications for this visit.      PHYSICAL EXAM: Vitals:     01 /08/19 1514  BP: 115/75  Pulse: 70  Resp: 16  Temp: 99.8 F (37.7 C)  TempSrc: Oral  SpO2: 94%  Weight: 177 lb (80.3 kg)  Height: 6' (1.829 m)    GENERAL: The patient is a well-nourished male, in no acute distress. The vital signs are documented above. Easily palpable right popliteal pulse with complete healing of his incisions. Right transmetatarsal amputation healing with sutures still intact  MEDICAL ISSUES: Stable status post revascularization and transmetatarsal.  He will see Dr.Duda for continued follow-up regarding his transmetatarsal amputation.  We will see him in 23-monthfor office visit and noninvasive vascular   TRosetta Posner MD FPeachford HospitalVascular and Vein Specialists of GLh[facility] Dba St Clare Surgery CenterTel ([phone] [phone]

## 2017-07-21 ENCOUNTER — Encounter (INDEPENDENT_AMBULATORY_CARE_PROVIDER_SITE_OTHER): Payer: Self-pay | Admitting: Family

## 2017-07-21 ENCOUNTER — Ambulatory Visit (INDEPENDENT_AMBULATORY_CARE_PROVIDER_SITE_OTHER): Payer: BC Managed Care – PPO | Admitting: Family

## 2017-07-21 VITALS — Ht 72.0 in | Wt 177.0 lb

## 2017-07-21 DIAGNOSIS — E1140 Type 2 diabetes mellitus with diabetic neuropathy, unspecified: Secondary | ICD-10-CM

## 2017-07-21 DIAGNOSIS — I739 Peripheral vascular disease, unspecified: Secondary | ICD-10-CM

## 2017-07-21 DIAGNOSIS — Z89431 Acquired absence of right foot: Secondary | ICD-10-CM

## 2017-07-21 NOTE — Progress Notes (Signed)
Post-Op Visit Note   Patient: Adrian Bean           Date of Birth: 17-Dec-1950           MRN: [id number] Visit Date: 07/21/2017 PCP: [name], MD  Chief Complaint:  Chief Complaint  Patient presents with  . Right Foot - Routine Post Op    06/13/17 right transmet amputation     HPI:  HPI The patient is a 67 year old gentleman seen today status post right transmetatarsal amputation on 06/13/17. Residing at skilled nursing. Today foot dependent, patient in wheel chair. States is heel walking just to bathroom.  Ortho Exam Incision approximated with sutures. Ulcer to center of incision is 4 cm in diameter, is 100% filled in with exudative tissue. No dehiscence. There is surrounding maceration. no drainage. Swelling with dependent erythema. No sign of active infection.  Visit Diagnoses:  1. History of transmetatarsal amputation of right foot ([place])   2. Type 2 diabetes mellitus with diabetic neuropathy, without long-term current use of insulin ([place])   3. PAD (peripheral artery disease) ([place])     Plan: follow up in office in 2 weeks. incisional cleansing and apply dry dressings with dynaflex wrap 3 times weekly. Discussed importance of compression and elevation for wound healing. minimize weight bearing.   Follow-Up Instructions: Return in about 2 weeks (around 08/04/2017).   Imaging: No results found.  Orders:  No orders of the defined types were placed in this encounter.  No orders of the defined types were placed in this encounter.    PMFS History: Patient Active Problem List   Diagnosis Date Noted  . History of transmetatarsal amputation of right foot ([place]) 07/04/2017  . PAD (peripheral artery disease) ([place]) 06/02/2017  . Sepsis ([place]) 06/02/2017  . DM (diabetes mellitus), type 2 ([place]) 06/02/2017  . Tobacco abuse 06/02/2017  . Pain in limb 10/21/2011  . Swelling of limb 10/21/2011   Past Medical History:  Diagnosis Date  . Dermatophytosis, nail   .  Diabetes mellitus    type 2  . Diabetic neuropathy ([place])    [name] 06/02/2017  . Diffuse connective tissue disease ([place])   . Gangrene of toe of right foot ([place]) 06/02/2017  . High cholesterol   . Hypertension   . PAD (peripheral artery disease) ([place]) 06/02/2017  . Peripheral vascular disease ([place])   . Thyroid disorder   . Tobacco abuse 06/02/2017  . Type II diabetes mellitus ([place]) 06/02/2017    Family History  Adopted: Yes    Past Surgical History:  Procedure Laterality Date  . ABDOMINAL AORTAGRAM N/A 10/30/2011   Procedure: ABDOMINAL Maxcine Ham;  Surgeon: Serafina Mitchell, MD;  Location: [facility] CATH LAB;  Service: Cardiovascular;  Laterality: N/A;  . ABDOMINAL AORTOGRAM N/A 06/03/2017   Procedure: ABDOMINAL AORTOGRAM;  Surgeon: Conrad Hobbs, MD;  Location: [place] CV LAB;  Service: Cardiovascular;  Laterality: N/A;  . AMPUTATION Right 06/04/2017   Procedure: AMPUTATION 3rd toeDIGIT;  Surgeon: Rosetta Posner, MD;  Location: [place];  Service: Vascular;  Laterality: Right;  . AMPUTATION Right 06/13/2017   Procedure: RIGHT TRANSMETATARSAL AMPUTATION;  Surgeon: [name], MD;  Location: [place];  Service: Orthopedics;  Laterality: Right;  . CHOLECYSTECTOMY  06-20-2011  . CHOLECYSTECTOMY    . FEMORAL-POPLITEAL BYPASS GRAFT Right 06/04/2017   Procedure: BYPASS  FEMORAL-POPLITEAL ARTERY USING GREATER SAPHENOUS VEIN;  Surgeon: Rosetta Posner, MD;  Location: [place];  Service: Vascular;  Laterality: Right;  .  FOOT RAY RESECTION Left 2018   hx fourth and fifth ray amputation/notes 06/02/2017  . IR ANGIOGRAM EXTREMITY LEFT  01/17/2017  . IR FEM POP ART PTA MOD SED  01/17/2017  . IR ILIAC ART STENT INC PTA EA ADD IPSILAT MOD SED  01/17/2017  . IR ILIAC ART STENT INC PTA MOD SED  01/17/2017  . IR THORACENTESIS ASP PLEURAL SPACE W/IMG GUIDE  06/16/2017  . IR US GUIDE VASC ACCESS LEFT  01/17/2017  . IR US GUIDE VASC ACCESS RIGHT  01/17/2017  . LOWER EXTREMITY ANGIOGRAPHY Bilateral 06/03/2017   Procedure: Lower  Extremity Angiography;  Surgeon: Conrad [place], MD;  Location: [place] CV LAB;  Service: Cardiovascular;  Laterality: Bilateral;  . PERIPHERAL VASCULAR CATHETERIZATION Left 12/2016   angioplasty of iliac artery  hx/notes 06/02/2017  . VEIN HARVEST Right 06/04/2017   Procedure: VEIN HARVEST RIGHT SAPHENOUS VEIN;  Surgeon: Rosetta Posner, MD;  Location: [facility] OR;  Service: Vascular;  Laterality: Right;   Social History   Occupational History  . Not on file  Tobacco Use  . Smoking status: Current Every Day Smoker    Packs/day: 0.50    Years: 50.00    Pack years: 25.00    Types: Cigarettes  . Smokeless tobacco: Never Used  . Tobacco comment: 3 cigarettes per day  Substance and Sexual Activity  . Alcohol use: No    Frequency: Never  . Drug use: No  . Sexual activity: Not Currently

## 2017-08-04 ENCOUNTER — Ambulatory Visit (INDEPENDENT_AMBULATORY_CARE_PROVIDER_SITE_OTHER): Payer: Medicare Other | Admitting: Family

## 2017-08-08 ENCOUNTER — Encounter (INDEPENDENT_AMBULATORY_CARE_PROVIDER_SITE_OTHER): Payer: Self-pay | Admitting: Family

## 2017-08-08 ENCOUNTER — Ambulatory Visit (INDEPENDENT_AMBULATORY_CARE_PROVIDER_SITE_OTHER): Payer: BC Managed Care – PPO | Admitting: Family

## 2017-08-08 VITALS — Ht 72.0 in | Wt 177.0 lb

## 2017-08-08 DIAGNOSIS — Z89431 Acquired absence of right foot: Secondary | ICD-10-CM

## 2017-08-08 NOTE — Progress Notes (Signed)
Post-Op Visit Note   Patient: Adrian Bean           Date of Birth: 1951/03/25           MRN: [id number] Visit Date: 08/08/2017 PCP: [name], MD  Chief Complaint:  Chief Complaint  Patient presents with  . Right Foot - Routine Post Op    06/13/17 right transmet amp    HPI:  HPI The patient is a 67 year old gentleman seen today status post right transmetatarsal amputation on 06/13/17. Residing at skilled [facility] in [place].   Today foot dependent, patient in wheel chair. States is heel walking just to bathroom.  Has been in Dynaflex wrap with dry dressing over amputation. Having this changed 3 times weekly.  Ortho Exam Incision approximated with sutures. Ulcer to center of incision is 4 cm in diameter, is 100% filled in with eschar. There is no surrounding maceration. no drainage. Swelling improved. No erythema. No sign of active infection.  Visit Diagnoses:  1. History of transmetatarsal amputation of right foot ([place])     Plan: follow up in office in 2 weeks. incisional cleansing and apply Santyl dressings with dynaflex wrap 3 times weekly. Discussed importance of compression and elevation for wound healing. minimize weight bearing.   Follow-Up Instructions: Return in about 3 weeks (around 08/29/2017).   Imaging: No results found.  Orders:  No orders of the defined types were placed in this encounter.  No orders of the defined types were placed in this encounter.    PMFS History: Patient Active Problem List   Diagnosis Date Noted  . History of transmetatarsal amputation of right foot ([place]) 07/04/2017  . PAD (peripheral artery disease) ([place]) 06/02/2017  . Sepsis ([place]) 06/02/2017  . DM (diabetes mellitus), type 2 ([place]) 06/02/2017  . Tobacco abuse 06/02/2017  . Pain in limb 10/21/2011  . Swelling of limb 10/21/2011   Past Medical History:  Diagnosis Date  . Dermatophytosis, nail   . Diabetes mellitus    type 2  . Diabetic neuropathy  ([place])    [name] 06/02/2017  . Diffuse connective tissue disease ([place])   . Gangrene of toe of right foot ([place]) 06/02/2017  . High cholesterol   . Hypertension   . PAD (peripheral artery disease) ([place]) 06/02/2017  . Peripheral vascular disease ([place])   . Thyroid disorder   . Tobacco abuse 06/02/2017  . Type II diabetes mellitus ([place]) 06/02/2017    Family History  Adopted: Yes    Past Surgical History:  Procedure Laterality Date  . ABDOMINAL AORTAGRAM N/A 10/30/2011   Procedure: ABDOMINAL Maxcine Ham;  Surgeon: Serafina Mitchell, MD;  Location: [facility] CATH LAB;  Service: Cardiovascular;  Laterality: N/A;  . ABDOMINAL AORTOGRAM N/A 06/03/2017   Procedure: ABDOMINAL AORTOGRAM;  Surgeon: Conrad Kirkland, MD;  Location: [place] CV LAB;  Service: Cardiovascular;  Laterality: N/A;  . AMPUTATION Right 06/04/2017   Procedure: AMPUTATION 3rd toeDIGIT;  Surgeon: Rosetta Posner, MD;  Location: [place];  Service: Vascular;  Laterality: Right;  . AMPUTATION Right 06/13/2017   Procedure: RIGHT TRANSMETATARSAL AMPUTATION;  Surgeon: [name], MD;  Location: [place];  Service: Orthopedics;  Laterality: Right;  . CHOLECYSTECTOMY  06-20-2011  . CHOLECYSTECTOMY    . FEMORAL-POPLITEAL BYPASS GRAFT Right 06/04/2017   Procedure: BYPASS  FEMORAL-POPLITEAL ARTERY USING GREATER SAPHENOUS VEIN;  Surgeon: Rosetta Posner, MD;  Location: [place];  Service: Vascular;  Laterality: Right;  . FOOT RAY RESECTION Left 2018  hx fourth and fifth ray amputation/notes 06/02/2017  . IR ANGIOGRAM EXTREMITY LEFT  01/17/2017  . IR FEM POP ART PTA MOD SED  01/17/2017  . IR ILIAC ART STENT INC PTA EA ADD IPSILAT MOD SED  01/17/2017  . IR ILIAC ART STENT INC PTA MOD SED  01/17/2017  . IR THORACENTESIS ASP PLEURAL SPACE W/IMG GUIDE  06/16/2017  . IR US GUIDE VASC ACCESS LEFT  01/17/2017  . IR US GUIDE VASC ACCESS RIGHT  01/17/2017  . LOWER EXTREMITY ANGIOGRAPHY Bilateral 06/03/2017   Procedure: Lower Extremity Angiography;  Surgeon: Conrad Kirwin, MD;   Location: [place] CV LAB;  Service: Cardiovascular;  Laterality: Bilateral;  . PERIPHERAL VASCULAR CATHETERIZATION Left 12/2016   angioplasty of iliac artery  hx/notes 06/02/2017  . VEIN HARVEST Right 06/04/2017   Procedure: VEIN HARVEST RIGHT SAPHENOUS VEIN;  Surgeon: Rosetta Posner, MD;  Location: [facility] OR;  Service: Vascular;  Laterality: Right;   Social History   Occupational History  . Not on file  Tobacco Use  . Smoking status: Current Every Day Smoker    Packs/day: 0.50    Years: 50.00    Pack years: 25.00    Types: Cigarettes  . Smokeless tobacco: Never Used  . Tobacco comment: 3 cigarettes per day  Substance and Sexual Activity  . Alcohol use: No    Frequency: Never  . Drug use: No  . Sexual activity: Not Currently

## 2017-08-28 ENCOUNTER — Ambulatory Visit (INDEPENDENT_AMBULATORY_CARE_PROVIDER_SITE_OTHER): Payer: BC Managed Care – PPO | Admitting: Orthopedic Surgery

## 2017-08-28 ENCOUNTER — Encounter (INDEPENDENT_AMBULATORY_CARE_PROVIDER_SITE_OTHER): Payer: Self-pay | Admitting: Orthopedic Surgery

## 2017-08-28 VITALS — Ht 72.0 in | Wt 177.0 lb

## 2017-08-28 DIAGNOSIS — I87321 Chronic venous hypertension (idiopathic) with inflammation of right lower extremity: Secondary | ICD-10-CM | POA: Insufficient documentation

## 2017-08-28 DIAGNOSIS — Z89431 Acquired absence of right foot: Secondary | ICD-10-CM | POA: Diagnosis not present

## 2017-08-28 NOTE — Progress Notes (Signed)
Office Visit Note   Patient: Adrian Bean           Date of Birth: 1950/09/14           MRN: [id number] Visit Date: 08/28/2017              Requested by: [name], MD [place], [place] [address] PCP: [name], MD  Chief Complaint  Patient presents with  . Right Foot - Routine Post Op    06/13/17 right transmet amputation       HPI: Patient is a 67 year old gentleman status post transmetatarsal amputation on the right with massive venous insufficiency he has been in a Dynaflex wrap.  Patient states that he is now elevating his foot.  Assessment & Plan: Visit Diagnoses:  1. History of transmetatarsal amputation of right foot ([place])   2. 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  Follow-Up Instructions: Return in about 1 week (around 09/04/2017).   Ortho Exam  Patient is alert, oriented, no adenopathy, well-dressed, normal affect, normal respiratory effort. Examination patient still has significant venous insufficiency in the right lower extremity with pitting edema the ulcer is healing nicely there is a very small eschar which has good granulation tissue around the edges it measures 2 x 4 cm.  Imaging: No results found. No images are attached to the encounter.  Labs: Lab Results  Component Value Date   HGBA1C 6.3 (H) 06/03/2017   REPTSTATUS 06/18/2017 FINAL 06/13/2017   GRAMSTAIN  06/13/2017    FEW WBC PRESENT,BOTH PMN AND MONONUCLEAR NO ORGANISMS SEEN    CULT No growth aerobically or anaerobically. 06/13/2017    @LABSALLVALUES (HGBA1)@  Body mass index is 24.01 kg/m.  Orders:  No orders of the defined types were placed in this encounter.  No orders of the defined types were placed in this  encounter.    Procedures: No procedures performed  Clinical Data: No additional findings.  ROS:  All other systems negative, except as noted in the HPI. Review of Systems  Objective: Vital Signs: Ht 6' (1.829 m)   Wt 177 lb (80.3 kg)   BMI 24.01 kg/m   Specialty Comments:  No specialty comments available.  PMFS History: Patient Active Problem List   Diagnosis Date Noted  . 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 History of transmetatarsal amputation of right foot ([place]) 07/04/2017  . PAD (peripheral artery disease) ([place]) 06/02/2017  . Sepsis ([place]) 06/02/2017  . DM (diabetes mellitus), type 2 ([place]) 06/02/2017  . Tobacco abuse 06/02/2017  . Pain in limb 10/21/2011  . Swelling of limb 10/21/2011   Past Medical History:  Diagnosis Date  . Dermatophytosis, nail   . Diabetes mellitus    type 2  . Diabetic neuropathy ([place])    [name] 06/02/2017  . Diffuse connective tissue disease ([place])   . Gangrene of toe of right foot ([place]) 06/02/2017  . High cholesterol   . Hypertension   . PAD (peripheral artery disease) ([place]) 06/02/2017  . Peripheral vascular disease ([place])   . Thyroid disorder   . Tobacco abuse 06/02/2017  . Type II diabetes mellitus ([place]) 06/02/2017    Family History  Adopted: Yes    Past Surgical History:  Procedure  Laterality Date  . ABDOMINAL AORTAGRAM N/A 10/30/2011   Procedure: ABDOMINAL Maxcine Ham;  Surgeon: Serafina Mitchell, MD;  Location: [facility] CATH LAB;  Service: Cardiovascular;  Laterality: N/A;  . ABDOMINAL AORTOGRAM N/A 06/03/2017   Procedure: ABDOMINAL AORTOGRAM;  Surgeon: Conrad [place], MD;  Location: [place] CV LAB;  Service: Cardiovascular;  Laterality: N/A;  . AMPUTATION Right 06/04/2017   Procedure: AMPUTATION 3rd toeDIGIT;  Surgeon: Rosetta Posner, MD;  Location: [place];  Service: Vascular;  Laterality: Right;  . AMPUTATION Right 06/13/2017   Procedure: RIGHT TRANSMETATARSAL AMPUTATION;   Surgeon: [name], MD;  Location: [place];  Service: Orthopedics;  Laterality: Right;  . CHOLECYSTECTOMY  06-20-2011  . CHOLECYSTECTOMY    . FEMORAL-POPLITEAL BYPASS GRAFT Right 06/04/2017   Procedure: BYPASS  FEMORAL-POPLITEAL ARTERY USING GREATER SAPHENOUS VEIN;  Surgeon: Rosetta Posner, MD;  Location: [facility] [place];  Service: Vascular;  Laterality: Right;  . FOOT RAY RESECTION Left 2018   hx fourth and fifth ray amputation/notes 06/02/2017  . IR ANGIOGRAM EXTREMITY LEFT  01/17/2017  . IR FEM POP ART PTA MOD SED  01/17/2017  . IR ILIAC ART STENT INC PTA EA ADD IPSILAT MOD SED  01/17/2017  . IR ILIAC ART STENT INC PTA MOD SED  01/17/2017  . IR THORACENTESIS ASP PLEURAL SPACE W/IMG GUIDE  06/16/2017  . IR US GUIDE VASC ACCESS LEFT  01/17/2017  . IR US GUIDE VASC ACCESS RIGHT  01/17/2017  . LOWER EXTREMITY ANGIOGRAPHY Bilateral 06/03/2017   Procedure: Lower Extremity Angiography;  Surgeon: Conrad [place], MD;  Location: [facility] CV LAB;  Service: Cardiovascular;  Laterality: Bilateral;  . PERIPHERAL VASCULAR CATHETERIZATION Left 12/2016   angioplasty of iliac artery  hx/notes 06/02/2017  . VEIN HARVEST Right 06/04/2017   Procedure: VEIN HARVEST RIGHT SAPHENOUS VEIN;  Surgeon: Rosetta Posner, MD;  Location: [facility] OR;  Service: Vascular;  Laterality: Right;   Social History   Occupational History  . Not on file  Tobacco Use  . Smoking status: Current Every Day Smoker    Packs/day: 0.50    Years: 50.00    Pack years: 25.00    Types: Cigarettes  . Smokeless tobacco: Never Used  . Tobacco comment: 3 cigarettes per day  Substance and Sexual Activity  . Alcohol use: No    Frequency: Never  . Drug use: No  . Sexual activity: Not Currently

## 2017-09-04 ENCOUNTER — Encounter (INDEPENDENT_AMBULATORY_CARE_PROVIDER_SITE_OTHER): Payer: Self-pay | Admitting: Orthopedic Surgery

## 2017-09-04 ENCOUNTER — Ambulatory Visit (INDEPENDENT_AMBULATORY_CARE_PROVIDER_SITE_OTHER): Payer: BC Managed Care – PPO | Admitting: Orthopedic Surgery

## 2017-09-04 VITALS — Ht 72.0 in | Wt 177.0 lb

## 2017-09-04 DIAGNOSIS — Z89431 Acquired absence of right foot: Secondary | ICD-10-CM

## 2017-09-04 NOTE — Progress Notes (Signed)
Office Visit Note   Patient: Adrian Bean           Date of Birth: 05-15-1951           MRN: [id number] Visit Date: 09/04/2017              Requested by: [name], MD [place], [place] [address] PCP: [name], MD  Chief Complaint  Patient presents with  . Right Foot - Routine Post Op    06/13/18 right transmet amputation       HPI: Patient presents status post transmetatarsal irritation on the right.  Patient is currently at skilled nursing he states that he will be discharged unless he can begin physical therapy.  Patient states that if he goes home he will be full weightbearing.  Assessment & Plan: Visit Diagnoses:  1. History of transmetatarsal amputation of right foot ([place])     Plan: Patient is given orders to wash the foot daily with soap and water apply 4 x 4 plus an Ace wrap.  Physical therapy weightbearing as tolerated on the right with a postoperative shoe.  Follow-Up Instructions: Return in about 2 weeks (around 09/18/2017).   Ortho Exam  Patient is alert, oriented, no adenopathy, well-dressed, normal affect, normal respiratory effort. Examination the swelling has decreased significantly with the compression wrap however patient still has swelling.  There is pitting edema in the foot ankle and calf.  There is an eschar approximately 2 x 4 cm there is some mild maceration from the compression wrap.  There is no purulence no exposed bone or tendon.  No cellulitis no clinical signs of infection.  Imaging: No results found. No images are attached to the encounter.  Labs: Lab Results  Component Value Date   HGBA1C 6.3 (H) 06/03/2017   REPTSTATUS 06/18/2017 FINAL 06/13/2017   GRAMSTAIN  06/13/2017    FEW WBC PRESENT,BOTH PMN AND MONONUCLEAR NO ORGANISMS SEEN    CULT No growth aerobically or anaerobically. 06/13/2017    @LABSALLVALUES (HGBA1)@  Body mass index is 24.01 kg/m.  Orders:  No orders of the defined types  were placed in this encounter.  No orders of the defined types were placed in this encounter.    Procedures: No procedures performed  Clinical Data: No additional findings.  ROS:  All other systems negative, except as noted in the HPI. Review of Systems  Objective: Vital Signs: Ht 6' (1.829 m)   Wt 177 lb (80.3 kg)   BMI 24.01 kg/m   Specialty Comments:  No specialty comments available.  PMFS History: Patient Active Problem List   Diagnosis Date Noted  . 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 History of transmetatarsal amputation of right foot ([place]) 07/04/2017  . PAD (peripheral artery disease) ([place]) 06/02/2017  . Sepsis ([place]) 06/02/2017  . DM (diabetes mellitus), type 2 ([place]) 06/02/2017  . Tobacco abuse 06/02/2017  . Pain in limb 10/21/2011  . Swelling of limb 10/21/2011   Past Medical History:  Diagnosis Date  . Dermatophytosis, nail   . Diabetes mellitus    type 2  . Diabetic neuropathy ([place])    [name] 06/02/2017  . Diffuse connective tissue disease ([place])   . Gangrene of toe of right foot ([place]) 06/02/2017  . High cholesterol   . Hypertension   . PAD (peripheral artery disease) ([place]) 06/02/2017  . Peripheral vascular disease ([place])   . Thyroid disorder   . Tobacco abuse  06/02/2017  . Type II diabetes mellitus ([place]) 06/02/2017    Family History  Adopted: Yes    Past Surgical History:  Procedure Laterality Date  . ABDOMINAL AORTAGRAM N/A 10/30/2011   Procedure: ABDOMINAL Maxcine Ham;  Surgeon: Serafina Mitchell, MD;  Location: [facility] CATH LAB;  Service: Cardiovascular;  Laterality: N/A;  . ABDOMINAL AORTOGRAM N/A 06/03/2017   Procedure: ABDOMINAL AORTOGRAM;  Surgeon: Conrad Thor, MD;  Location: [place] CV LAB;  Service: Cardiovascular;  Laterality: N/A;  . AMPUTATION Right 06/04/2017   Procedure: AMPUTATION 3rd toeDIGIT;  Surgeon: Rosetta Posner, MD;  Location: [place];  Service: Vascular;  Laterality: Right;    . AMPUTATION Right 06/13/2017   Procedure: RIGHT TRANSMETATARSAL AMPUTATION;  Surgeon: [name], MD;  Location: [place];  Service: Orthopedics;  Laterality: Right;  . CHOLECYSTECTOMY  06-20-2011  . CHOLECYSTECTOMY    . FEMORAL-POPLITEAL BYPASS GRAFT Right 06/04/2017   Procedure: BYPASS  FEMORAL-POPLITEAL ARTERY USING GREATER SAPHENOUS VEIN;  Surgeon: Rosetta Posner, MD;  Location: [facility];  Service: Vascular;  Laterality: Right;  . FOOT RAY RESECTION Left 2018   hx fourth and fifth ray amputation/notes 06/02/2017  . IR ANGIOGRAM EXTREMITY LEFT  01/17/2017  . IR FEM POP ART PTA MOD SED  01/17/2017  . IR ILIAC ART STENT INC PTA EA ADD IPSILAT MOD SED  01/17/2017  . IR ILIAC ART STENT INC PTA MOD SED  01/17/2017  . IR THORACENTESIS ASP PLEURAL SPACE W/IMG GUIDE  06/16/2017  . IR US GUIDE VASC ACCESS LEFT  01/17/2017  . IR US GUIDE VASC ACCESS RIGHT  01/17/2017  . LOWER EXTREMITY ANGIOGRAPHY Bilateral 06/03/2017   Procedure: Lower Extremity Angiography;  Surgeon: Conrad [place], MD;  Location: [place] CV LAB;  Service: Cardiovascular;  Laterality: Bilateral;  . PERIPHERAL VASCULAR CATHETERIZATION Left 12/2016   angioplasty of iliac artery  hx/notes 06/02/2017  . VEIN HARVEST Right 06/04/2017   Procedure: VEIN HARVEST RIGHT SAPHENOUS VEIN;  Surgeon: Rosetta Posner, MD;  Location[facility] OR;  Service: Vascular;  Laterality: Right;   Social History   Occupational History  . Not on file  Tobacco Use  . Smoking status: Current Every Day Smoker    Packs/day: 0.50    Years: 50.00    Pack years: 25.00    Types: Cigarettes  . Smokeless tobacco: Never Used  . Tobacco comment: 3 cigarettes per day  Substance and Sexual Activity  . Alcohol use: No    Frequency: Never  . Drug use: No  . Sexual activity: Not Currently

## 2017-09-17 DIAGNOSIS — E1142 Type 2 diabetes mellitus with diabetic polyneuropathy: Secondary | ICD-10-CM | POA: Insufficient documentation

## 2017-09-18 ENCOUNTER — Ambulatory Visit (INDEPENDENT_AMBULATORY_CARE_PROVIDER_SITE_OTHER): Payer: BC Managed Care – PPO | Admitting: Orthopedic Surgery

## 2017-09-29 ENCOUNTER — Other Ambulatory Visit: Payer: Self-pay

## 2017-09-29 DIAGNOSIS — I739 Peripheral vascular disease, unspecified: Secondary | ICD-10-CM

## 2017-10-07 ENCOUNTER — Encounter (HOSPITAL_COMMUNITY): Payer: Medicare Other

## 2017-10-07 ENCOUNTER — Ambulatory Visit: Payer: BC Managed Care – PPO | Admitting: Vascular Surgery

## 2017-12-12 DIAGNOSIS — Z125 Encounter for screening for malignant neoplasm of prostate: Secondary | ICD-10-CM | POA: Insufficient documentation

## 2017-12-12 DIAGNOSIS — Z1211 Encounter for screening for malignant neoplasm of colon: Secondary | ICD-10-CM | POA: Insufficient documentation

## 2017-12-12 DIAGNOSIS — I10 Essential (primary) hypertension: Secondary | ICD-10-CM | POA: Insufficient documentation

## 2017-12-12 DIAGNOSIS — Z1159 Encounter for screening for other viral diseases: Secondary | ICD-10-CM | POA: Insufficient documentation

## 2018-06-17 DIAGNOSIS — E782 Mixed hyperlipidemia: Secondary | ICD-10-CM | POA: Insufficient documentation

## 2018-06-18 DIAGNOSIS — G629 Polyneuropathy, unspecified: Secondary | ICD-10-CM | POA: Insufficient documentation

## 2018-06-18 DIAGNOSIS — T8149XA Infection following a procedure, other surgical site, initial encounter: Secondary | ICD-10-CM | POA: Insufficient documentation

## 2018-10-25 IMAGING — MR MR FOOT*R* WO/W CM
5 of 10 series · 23 of 40 positions shown · IV contrast (multihance)
Comparison: None.

CLINICAL DATA: Dry gangrene of the right third toe.

EXAM:
MRI OF THE RIGHT FOREFOOT WITHOUT AND WITH CONTRAST
TECHNIQUE: Multiplanar, multisequence MR imaging of the right forefoot was
performed before and after the administration of intravenous
contrast.
CONTRAST:  17mL MULTIHANCE GADOBENATE DIMEGLUMINE 529 MG/ML IV SOLN

[Series 5: T1 · coronal · 4.0mm · 0.27mm/px · 5 of 40 slices shown (1 of 2)]
[im 1/40]
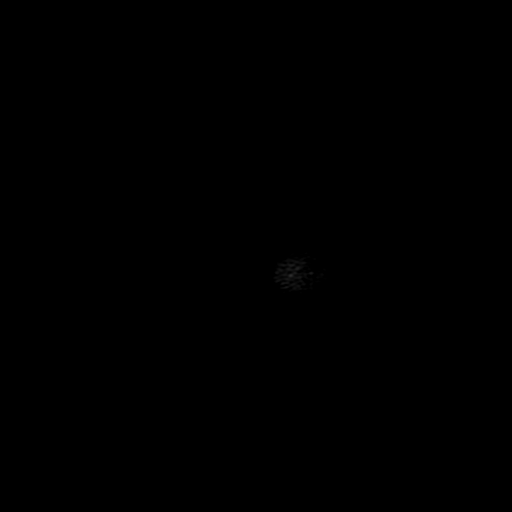
[im 10/40]
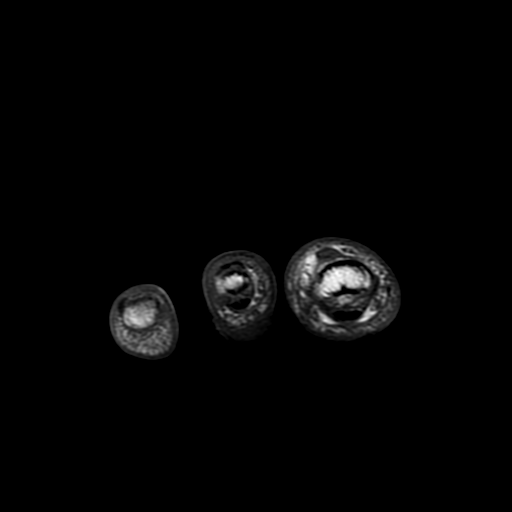
[im 20/40]
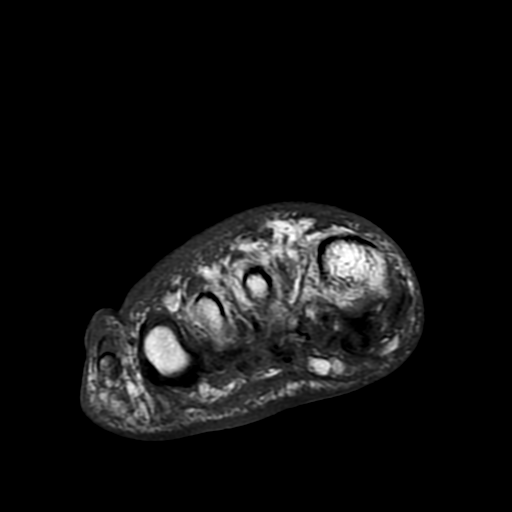
[im 30/40]
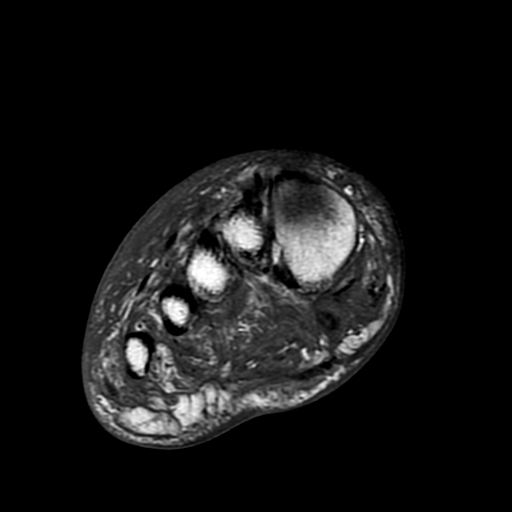
[im 40/40]
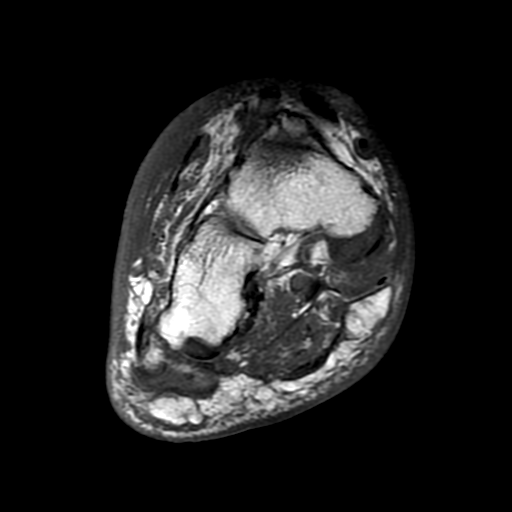

[Series 7: T1 fat-sat · coronal · non-contrast · 4.0mm · 0.27mm/px · 6 of 40 slices shown]
[im 1/40]
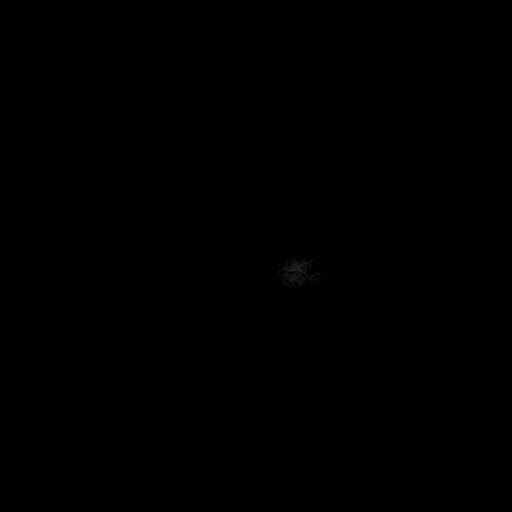
[im 8/40]
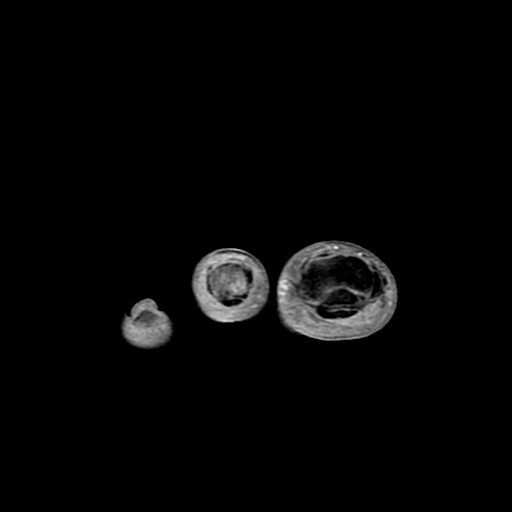
[im 16/40]
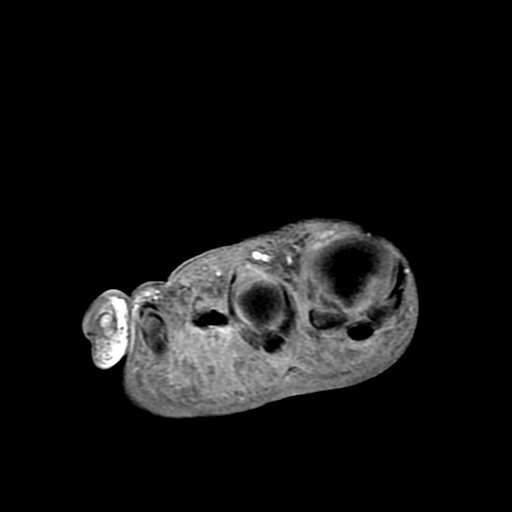
[im 24/40]
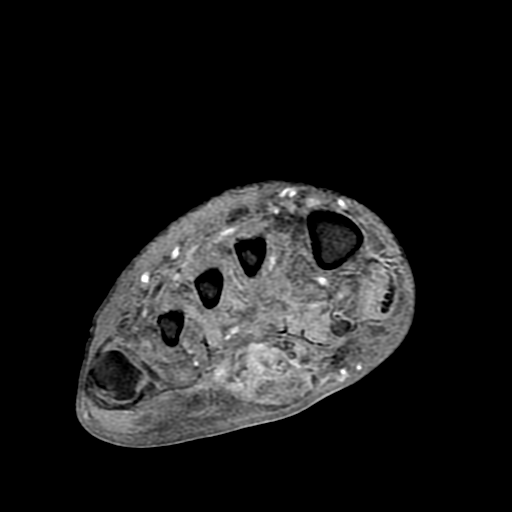
[im 32/40]
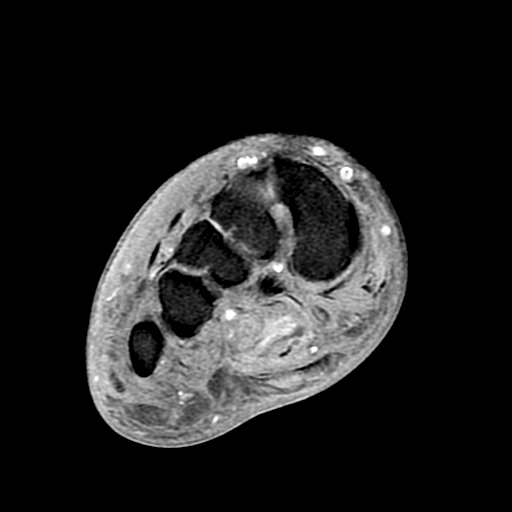
[im 40/40]
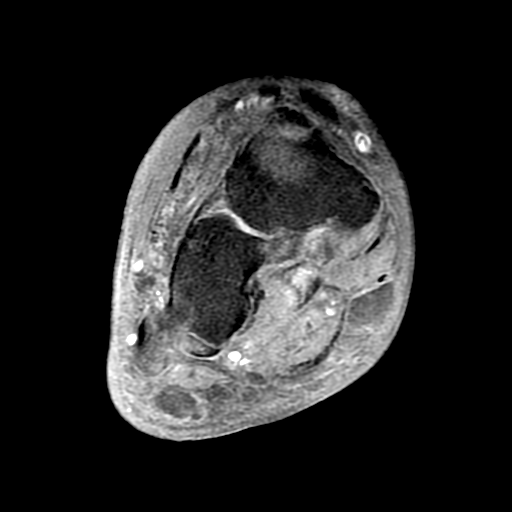

[Series 8: T2 fat-sat · coronal · 4.0mm · 0.55mm/px · 6 of 40 slices shown]
[im 1/40]
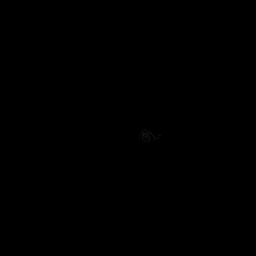
[im 8/40]
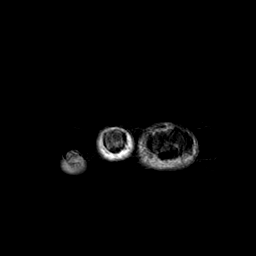
[im 16/40]
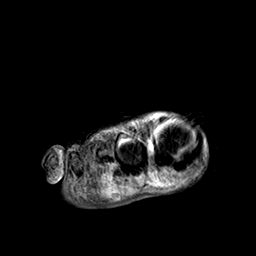
[im 24/40]
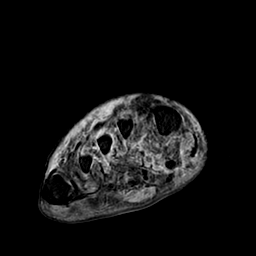
[im 32/40]
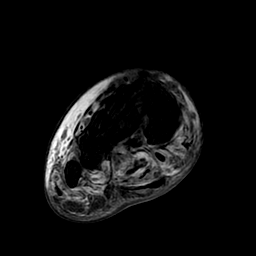
[im 40/40]
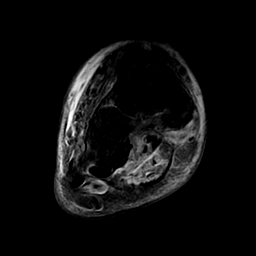

[Series 10: T1 · oblique · 3.0mm · 0.35mm/px · 3 of 21 slices shown (2 of 2)]
[im 1/21]
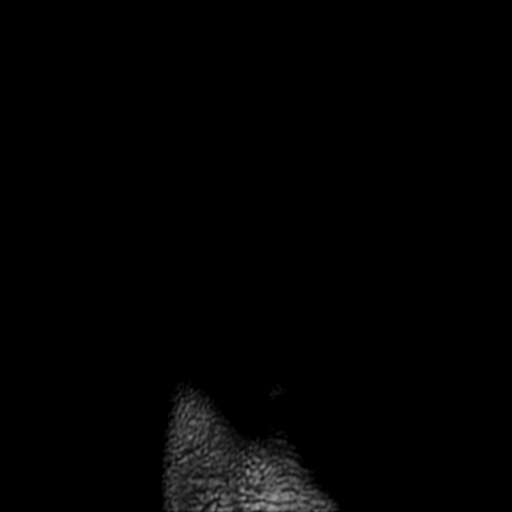
[im 11/21]
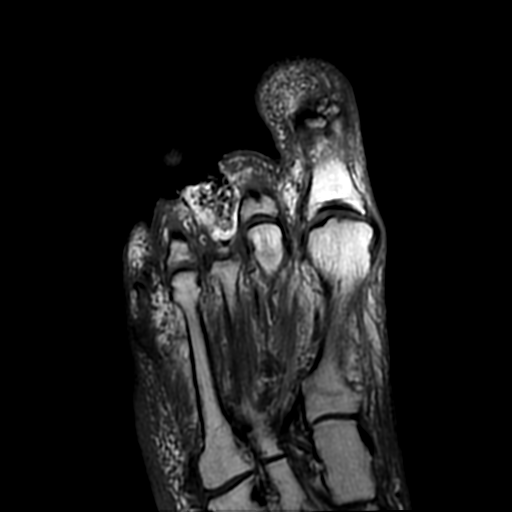
[im 21/21]
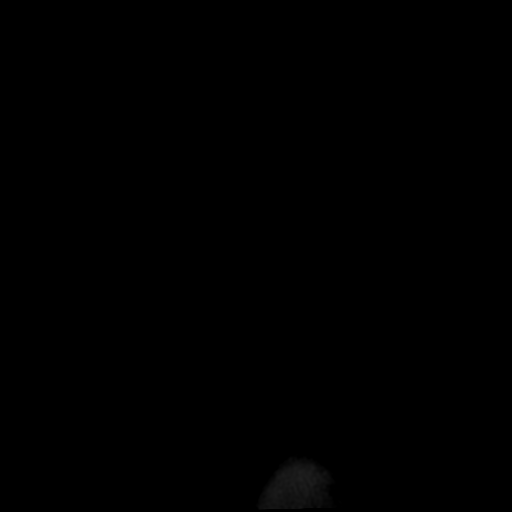

[Series 12: T1 fat-sat post-contrast · coronal · 4.0mm · 0.27mm/px · 3 of 40 slices shown]
[im 1/40]
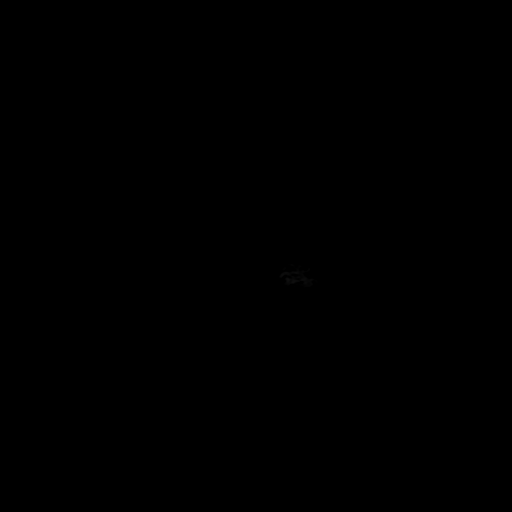
[im 8/40]
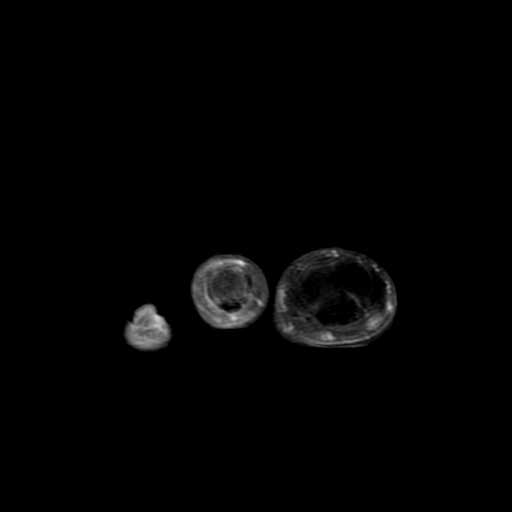
[im 16/40]
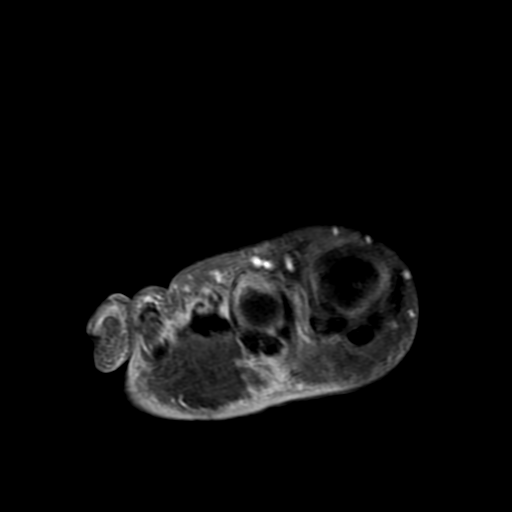

[23 of 40 positions shown; findings below may reference images not displayed]

FINDINGS: Bones/Joint/Cartilage

Third toe and portion of the third metatarsal head. No adjacent
marrow edema of the third metatarsal to suggest osteomyelitis.
Reactive marrow edema is noted of the second third toes without
cortical bone destruction. No acute fracture or malalignment.

Ligaments

Intact given limitations of soft tissue edema.

Muscles and Tendons

The extensor and flexor tendons to the remaining toes are intact and
of normal signal intensity and morphology.

Soft tissues

There is an nonenhancing postop subcutaneous fluid collection
measuring 4.8 x 1.7 x 1.3 cm along the plantar aspect of the distal
second and third metatarsals tracking along the lateral aspect of
the flexor digitorum longus and brevis tendons.
IMPRESSION: 1. Status post amputation of the third toe and portion of the third
metatarsal head without marrow signal abnormalities to indicate
osteomyelitis of the third metatarsal.
2. There is mild reactive edema of the adjacent second and fourth
toes without cortical bone destruction, fracture or joint
dislocations.
3. Small nonenhancing plantar fluid collection of the forefoot
measuring 4.8 x 1.7 x 1.3 cm likely postop in etiology.

## 2018-10-26 IMAGING — DX DG CHEST 1V PORT
1 series · 1 of 1 positions shown · non-contrast
Comparison: Radiograph June 08, 2017.

CLINICAL DATA: Shortness of breath.

EXAM:
PORTABLE CHEST 1 VIEW

[chest]
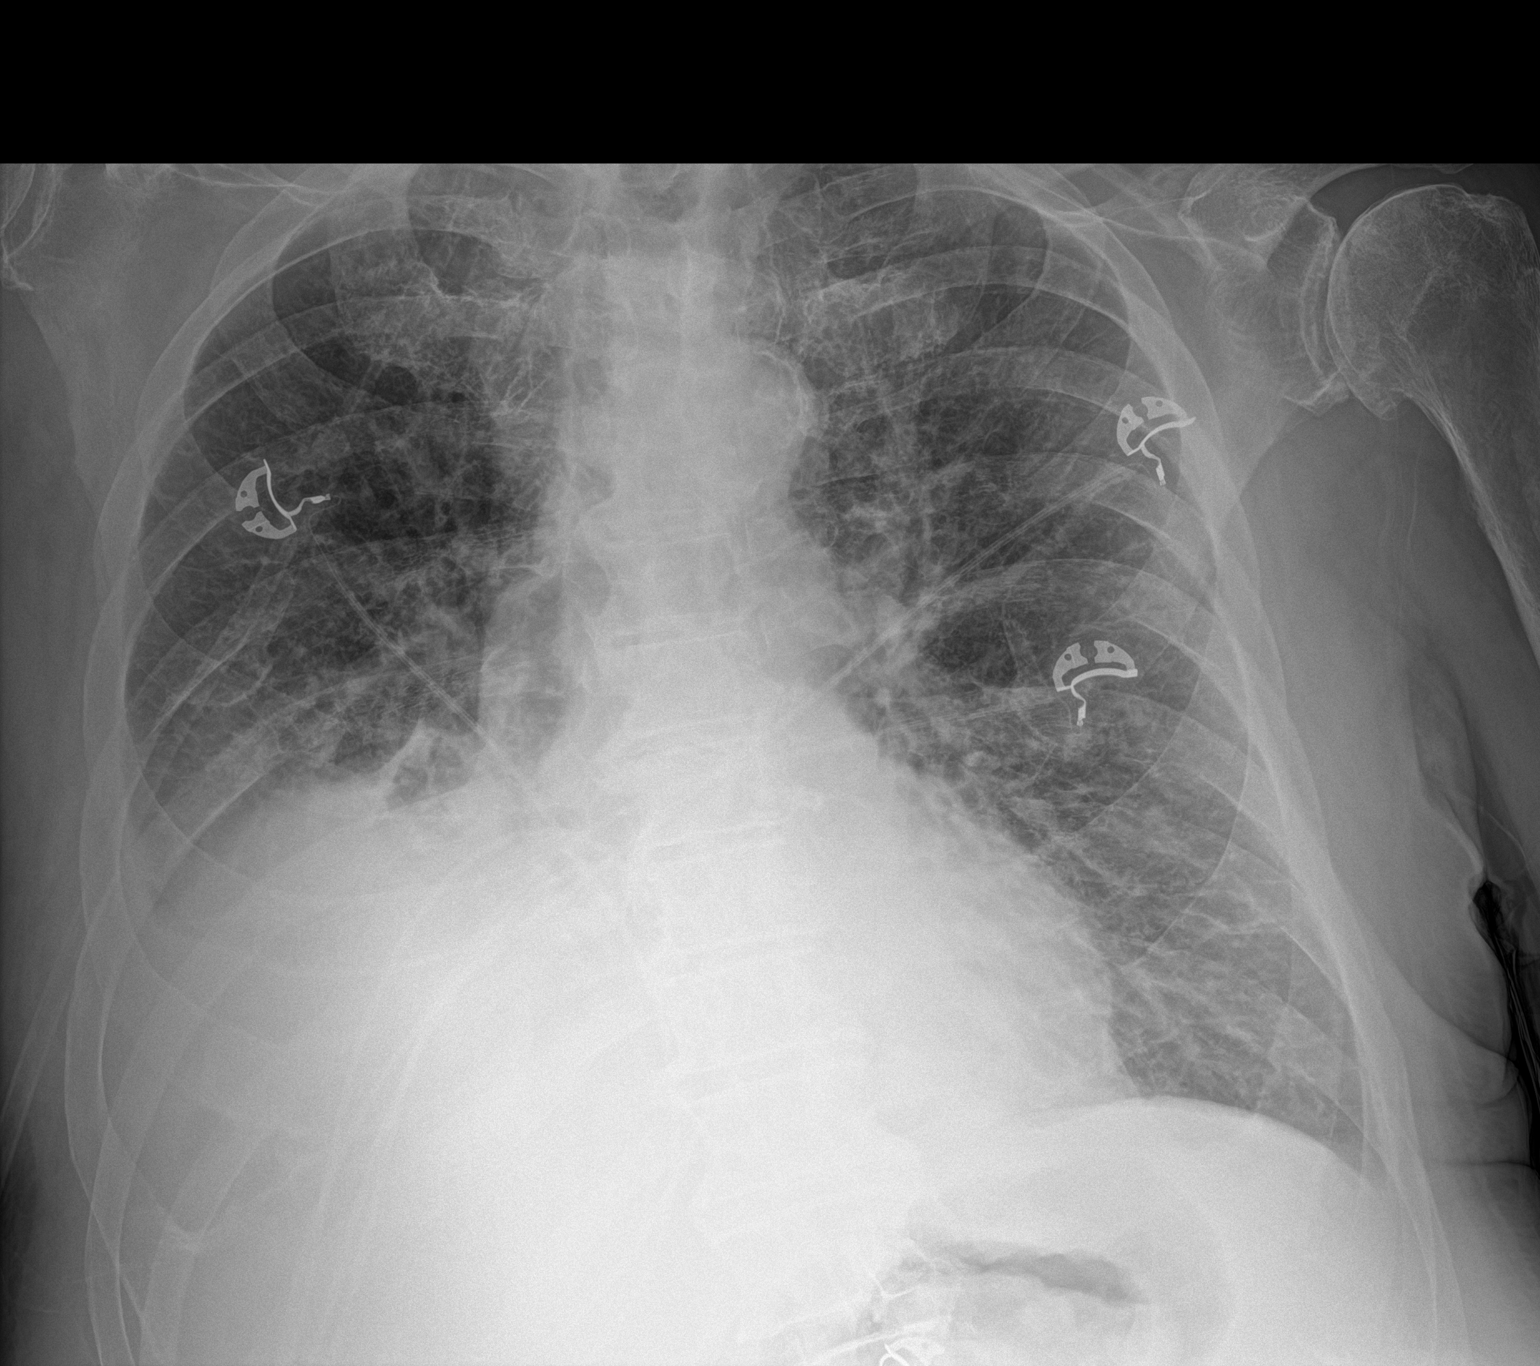

[1 of 1 positions shown; findings below may reference images not displayed]

FINDINGS: Stable cardiomediastinal silhouette. No pneumothorax is noted. Mild
interstitial densities are noted in the left lung base which may
represent edema or inflammation. Atherosclerosis of thoracic aorta
is noted. Stable moderate right pleural effusion is noted with
associated atelectasis or infiltrate. Bony thorax is unremarkable.
IMPRESSION: Aortic atherosclerosis. Stable moderate right pleural effusion with
associated atelectasis or infiltrate. Mild interstitial densities
are noted in left lung base which may represent edema or
inflammation.

## 2018-12-25 ENCOUNTER — Other Ambulatory Visit: Payer: Self-pay

## 2018-12-25 ENCOUNTER — Ambulatory Visit (INDEPENDENT_AMBULATORY_CARE_PROVIDER_SITE_OTHER): Payer: BC Managed Care – PPO | Admitting: Sports Medicine

## 2018-12-25 ENCOUNTER — Encounter: Payer: Self-pay | Admitting: Sports Medicine

## 2018-12-25 DIAGNOSIS — Z89431 Acquired absence of right foot: Secondary | ICD-10-CM

## 2018-12-25 DIAGNOSIS — I739 Peripheral vascular disease, unspecified: Secondary | ICD-10-CM

## 2018-12-25 DIAGNOSIS — L97511 Non-pressure chronic ulcer of other part of right foot limited to breakdown of skin: Secondary | ICD-10-CM | POA: Diagnosis not present

## 2018-12-25 DIAGNOSIS — E1142 Type 2 diabetes mellitus with diabetic polyneuropathy: Secondary | ICD-10-CM | POA: Diagnosis not present

## 2018-12-25 NOTE — Progress Notes (Addendum)
Subjective: Adrian Bean is a 68 y.o. male patient seen in office for evaluation of ulceration of the right foot amputation stump. Patient states that he does not know why he was referred here.  Patient saw Dr. Unk Lightning this week and on yesterday was told that he needs to come to see me.  Patient reports that he has a history of a callus on his right amputation stump and was told that he should trim it and he thinks that this may have caused a problem with his right foot.  Patient reports that his PCP started him on antibiotics but he is not sure what the name of the antibiotic is and also got x-rays and told him to come here with the CD. Denies nausea/fever/vomiting/chills/night sweats/shortness of breath/pain. Patient has no other pedal complaints at this time.  Review of Systems  All other systems reviewed and are negative.    Patient Active Problem List   Diagnosis Date Noted  . 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 History of transmetatarsal amputation of right foot ([place]) 07/04/2017  . PAD (peripheral artery disease) ([place]) 06/02/2017  . Sepsis ([place]) 06/02/2017  . DM (diabetes mellitus), type 2 ([place]) 06/02/2017  . Tobacco abuse 06/02/2017  . Tick bite 01/22/2017  . Pain in limb 10/21/2011  . Swelling of limb 10/21/2011   Current Outpatient Medications on File Prior to Visit  Medication Sig Dispense Refill  . albuterol (PROVENTIL) (2.5 MG/3ML) 0.083% nebulizer solution Take 3 mLs (2.5 mg total) by nebulization every 4 (four) hours as needed for wheezing or shortness of breath. 75 mL 12  . Amino Acids-Protein Hydrolys (FEEDING SUPPLEMENT, PRO-STAT SUGAR FREE 64,) LIQD Take 30 mLs by mouth 2 (two) times daily. 900 mL 0  . aspirin EC 81 MG EC tablet Take 1 tablet (81 mg total) by mouth daily.    Marland Kitchen atorvastatin (LIPITOR) 10 MG tablet Take 10 mg by mouth daily.    Marland Kitchen atorvastatin (LIPITOR) 10 MG tablet Take 10 mg by mouth daily at 6  PM.    . bisacodyl (DULCOLAX) 10 MG suppository Place 1 suppository (10 mg total) rectally daily as needed for moderate constipation. 12 suppository 0  . clopidogrel (PLAVIX) 75 MG tablet Take 75 mg by mouth daily.    . collagenase (SANTYL) ointment Apply topically daily. 15 g 0  . feeding supplement, GLUCERNA SHAKE, (GLUCERNA SHAKE) LIQD Take 237 mLs by mouth 3 (three) times daily between meals.  0  . gabapentin (NEURONTIN) 300 MG capsule Take 300 mg by mouth 3 (three) times daily.    Marland Kitchen gabapentin (NEURONTIN) 300 MG capsule Take 600 mg by mouth 3 (three) times daily.    Marland Kitchen glimepiride (AMARYL) 2 MG tablet Take 2 mg by mouth daily with breakfast.    . glimepiride (AMARYL) 2 MG tablet Take 2 mg by mouth daily with breakfast.    . HEPARIN LOCK FLUSH IV Inject into the vein.    Marland Kitchen HYDROcodone-acetaminophen (NORCO) 10-325 MG tablet Take 1 tablet by mouth every 6 (six) hours as needed for moderate pain.    Marland Kitchen lisinopril (PRINIVIL,ZESTRIL) 10 MG tablet Take 10 mg by mouth daily.    Marland Kitchen lisinopril (PRINIVIL,ZESTRIL) 10 MG tablet Take 10 mg by mouth daily.    . Multiple Vitamin (MULTIVITAMIN WITH MINERALS) TABS tablet Take 1 tablet by mouth daily.    . multivitamin-iron-minerals-folic acid (CENTRUM) chewable tablet Chew 1 tablet by mouth daily.    . nicotine (NICODERM CQ -  DOSED IN MG/24 HOURS) 14 mg/24hr patch Place 1 patch (14 mg total) onto the skin daily. 28 patch 0  . omega-3 acid ethyl esters (LOVAZA) 1 g capsule Take 1 g by mouth daily.    . Omega-3 Fatty Acids (FISH OIL PO) Take 1 tablet by mouth daily.    Marland Kitchen oxyCODONE-acetaminophen (PERCOCET) 10-325 MG tablet Take 1 tablet by mouth every 6 (six) hours as needed for pain. 40 tablet 0  . polyethylene glycol (MIRALAX / GLYCOLAX) packet Take 17 g by mouth daily as needed for mild constipation.    . terbinafine (LAMISIL) 250 MG tablet Take 250 mg by mouth daily.     No current facility-administered medications on file prior to visit.    No Known  Allergies  No results found for this or any previous visit (from the past 2160 hour(s)).  Objective: There were no vitals filed for this visit.  General: Patient is awake, alert, oriented in no acute distress and is very talkative and tends to make jokes and divert away from the topic of concern  Dermatology: Skin is warm and dry bilateral with a partial thickness ulceration present  Right amputation stump. Ulceration measures 0.5 x 0.5 pre-debridement and 1 x 0.5cm post debridement. There is a severely keratotic border with a granular base. The ulceration does not  probe to bone. There is scant malodor likely from poor hygiene that improved after the foot was cleansed, no active drainage, no erythema, trace edema to stump on right.  No other acute signs of infection.   Vascular: Dorsalis Pedis pulse = 1/4 Bilateral,  Posterior Tibial pulse = 0/4 Bilateral,  Capillary Fill Time < 5 seconds  Neurologic: Protective sensation absent bilateral.    Musculosketal: No pain with palpation to ulcerated area.  Status post right foot TMA.  No pain with compression to calves bilateral.  Xrays, reviewed of the right amputation stump site on CD which appears to be osseous remodeling of the amputation metatarsals at the resection sites versus osteomyelitis due to the clinical appearance of the minor wound it is most likely consistent with amputation stump site reaction changes to the bone and less likely consistent with osteomyelitis however will closely monitor patient and keep her on antibiotics and good wound care for this wound stalls then will order a MRI for further evaluation.  No results for input(s): GRAMSTAIN, LABORGA in the last 8760 hours.  Assessment and Plan:  Problem List Items Addressed This Visit      Cardiovascular and Mediastinum   PAD (peripheral artery disease) ([place])     Other   History of transmetatarsal amputation of right foot ([place])    Other Visit Diagnoses    Skin ulcer of  right foot, limited to breakdown of skin ([place])    -  Primary   Relevant Orders   WOUND CULTURE   Diabetic polyneuropathy associated with type 2 diabetes mellitus ([place])         -Examined patient and discussed the progression of the wound and treatment alternatives. -Xrays reviewed on CD - Excisionally dedbrided ulceration at right amputation stump to healthy bleeding borders removing nonviable tissue using a sterile chisel blade. Wound measures post debridement as above. Wound was debrided to the level of the dermis with viable wound base exposed to promote healing. Hemostasis was achieved with manuel pressure. Patient tolerated procedure well without any discomfort or anesthesia necessary for this wound debridement. -Wound culture obtained will call patient if there is a need to change  his current antibiotics -Applied PRISMA Ag and dry sterile dressing and order home nursing with [facility] to do the same twice weekly.  Since patient has a hard time caring for him self and is an amputee - Advised patient to go to the ER or return to office if the wound worsens or if constitutional symptoms are present. -Patient to return to office in 2 weeks for follow up care and evaluation or sooner if problems arise.  Landis Martins, DPM

## 2018-12-28 LAB — WOUND CULTURE

## 2018-12-31 ENCOUNTER — Encounter: Payer: Self-pay | Admitting: Sports Medicine

## 2018-12-31 ENCOUNTER — Ambulatory Visit (INDEPENDENT_AMBULATORY_CARE_PROVIDER_SITE_OTHER): Payer: BC Managed Care – PPO | Admitting: Sports Medicine

## 2018-12-31 ENCOUNTER — Other Ambulatory Visit: Payer: Self-pay

## 2018-12-31 VITALS — Temp 97.0°F | Resp 16

## 2018-12-31 DIAGNOSIS — L97511 Non-pressure chronic ulcer of other part of right foot limited to breakdown of skin: Secondary | ICD-10-CM

## 2018-12-31 DIAGNOSIS — I739 Peripheral vascular disease, unspecified: Secondary | ICD-10-CM

## 2018-12-31 DIAGNOSIS — Z89431 Acquired absence of right foot: Secondary | ICD-10-CM

## 2018-12-31 DIAGNOSIS — E1142 Type 2 diabetes mellitus with diabetic polyneuropathy: Secondary | ICD-10-CM

## 2018-12-31 NOTE — Progress Notes (Signed)
Patient was seen in office for dressing change.  Amputation site on right appears to have a wound that is doing much better compared to last visit.  Patient has home health initiated and will expect the home health nurse to continue with using Prisma as ordered at last visit to his amputation site wound covered with dry dressing 3 times a week or as the wound permits.  Also advised patient that we will have home nurse to apply skin emollients as needed.  Patient to follow-up for continued wound care in 2 weeks or sooner if there is any problems or issues.

## 2019-01-15 ENCOUNTER — Other Ambulatory Visit: Payer: Self-pay

## 2019-01-15 ENCOUNTER — Encounter: Payer: Self-pay | Admitting: Sports Medicine

## 2019-01-15 ENCOUNTER — Ambulatory Visit (INDEPENDENT_AMBULATORY_CARE_PROVIDER_SITE_OTHER): Payer: BC Managed Care – PPO | Admitting: Sports Medicine

## 2019-01-15 VITALS — Temp 98.7°F | Resp 16

## 2019-01-15 DIAGNOSIS — L97511 Non-pressure chronic ulcer of other part of right foot limited to breakdown of skin: Secondary | ICD-10-CM

## 2019-01-15 DIAGNOSIS — I739 Peripheral vascular disease, unspecified: Secondary | ICD-10-CM

## 2019-01-15 DIAGNOSIS — E1142 Type 2 diabetes mellitus with diabetic polyneuropathy: Secondary | ICD-10-CM

## 2019-01-15 DIAGNOSIS — Z89431 Acquired absence of right foot: Secondary | ICD-10-CM

## 2019-01-15 NOTE — Progress Notes (Signed)
Subjective: Adrian Bean is a 68 y.o. male patient seen in office for evaluation of ulceration of the right foot amputation stump. Patient states that home nurse said that the area is scabbing up with very little drainage.  Patient reports that he is concerned about the buildup of the callus and wants to discuss if there is anything more that can be done.  Patient denies nausea vomiting fever chills or any other constitutional symptoms at this time.   Patient Active Problem List   Diagnosis Date Noted  . 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 History of transmetatarsal amputation of right foot ([place]) 07/04/2017  . PAD (peripheral artery disease) ([place]) 06/02/2017  . Sepsis ([place]) 06/02/2017  . DM (diabetes mellitus), type 2 ([place]) 06/02/2017  . Tobacco abuse 06/02/2017  . Tick bite 01/22/2017  . Pain in limb 10/21/2011  . Swelling of limb 10/21/2011   Current Outpatient Medications on File Prior to Visit  Medication Sig Dispense Refill  . albuterol (PROVENTIL) (2.5 MG/3ML) 0.083% nebulizer solution Take 3 mLs (2.5 mg total) by nebulization every 4 (four) hours as needed for wheezing or shortness of breath. 75 mL 12  . Amino Acids-Protein Hydrolys (FEEDING SUPPLEMENT, PRO-STAT SUGAR FREE 64,) LIQD Take 30 mLs by mouth 2 (two) times daily. 900 mL 0  . aspirin EC 81 MG EC tablet Take 1 tablet (81 mg total) by mouth daily.    Marland Kitchen atorvastatin (LIPITOR) 10 MG tablet Take 10 mg by mouth daily.    Marland Kitchen atorvastatin (LIPITOR) 10 MG tablet Take 10 mg by mouth daily at 6 PM.    . bisacodyl (DULCOLAX) 10 MG suppository Place 1 suppository (10 mg total) rectally daily as needed for moderate constipation. 12 suppository 0  . clindamycin (CLEOCIN) 300 MG capsule     . clopidogrel (PLAVIX) 75 MG tablet Take 75 mg by mouth daily.    . collagenase (SANTYL) ointment Apply topically daily. 15 g 0  . feeding supplement, GLUCERNA SHAKE, (GLUCERNA SHAKE) LIQD  Take 237 mLs by mouth 3 (three) times daily between meals.  0  . gabapentin (NEURONTIN) 300 MG capsule Take 300 mg by mouth 3 (three) times daily.    Marland Kitchen gabapentin (NEURONTIN) 300 MG capsule Take 600 mg by mouth 3 (three) times daily.    Marland Kitchen glimepiride (AMARYL) 2 MG tablet Take 2 mg by mouth daily with breakfast.    . glimepiride (AMARYL) 2 MG tablet Take 2 mg by mouth daily with breakfast.    . HEPARIN LOCK FLUSH IV Inject into the vein.    Marland Kitchen HYDROcodone-acetaminophen (NORCO) 10-325 MG tablet Take 1 tablet by mouth every 6 (six) hours as needed for moderate pain.    Marland Kitchen lisinopril (PRINIVIL,ZESTRIL) 10 MG tablet Take 10 mg by mouth daily.    Marland Kitchen lisinopril (PRINIVIL,ZESTRIL) 10 MG tablet Take 10 mg by mouth daily.    Marland Kitchen lisinopril-hydrochlorothiazide (ZESTORETIC) 20-25 MG tablet     . Multiple Vitamin (MULTIVITAMIN WITH MINERALS) TABS tablet Take 1 tablet by mouth daily.    . multivitamin-iron-minerals-folic acid (CENTRUM) chewable tablet Chew 1 tablet by mouth daily.    . nicotine (NICODERM CQ - DOSED IN MG/24 HOURS) 14 mg/24hr patch Place 1 patch (14 mg total) onto the skin daily. 28 patch 0  . omega-3 acid ethyl esters (LOVAZA) 1 g capsule Take 1 g by mouth daily.    . Omega-3 Fatty Acids (FISH OIL PO) Take 1 tablet by mouth daily.    Marland Kitchen  oxyCODONE-acetaminophen (PERCOCET) 10-325 MG tablet Take 1 tablet by mouth every 6 (six) hours as needed for pain. 40 tablet 0  . polyethylene glycol (MIRALAX / GLYCOLAX) packet Take 17 g by mouth daily as needed for mild constipation.    . terbinafine (LAMISIL) 250 MG tablet Take 250 mg by mouth daily.     No current facility-administered medications on file prior to visit.    No Known Allergies  Recent Results (from the past 2160 hour(s))  WOUND CULTURE     Status: None   Collection Time: 12/25/18  4:39 PM   Specimen: Foot, Right; Wound   WOUND CULTURE AND SENS  Result Value Ref Range   Gram Stain Result Final report    Organism ID, Bacteria Comment      Comment: No white blood cells seen.   Organism ID, Bacteria Comment     Comment: Many gram positive rods.   Organism ID, Bacteria Comment     Comment: Few gram positive cocci   Aerobic Bacterial Culture Final report    Organism ID, Bacteria Routine flora     Comment: Heavy growth    Objective: There were no vitals filed for this visit.  General: Patient is awake, alert, oriented in no acute distress and is very talkative and tends to make jokes and divert away from the topic of concern  Dermatology: Skin is warm and dry bilateral with a partial thickness ulceration present  Right amputation stump. Ulceration measures 0.5 x 0.5 pre-debridement and 1 x 0.5cm post debridement at the proximal aspect and 0.5 x 0.6 at the distal aspect. There is a severely keratotic border with a granular base. The ulceration does not  probe to bone.  There is no malodor, no active drainage, no erythema, trace edema to stump on right.  No other acute signs of infection.   Vascular: Dorsalis Pedis pulse = 1/4 Bilateral,  Posterior Tibial pulse = 0/4 Bilateral,  Capillary Fill Time < 5 seconds  Neurologic: Protective sensation absent bilateral.    Musculosketal: No pain with palpation to ulcerated area.  Status post right foot TMA.  No pain with compression to calves bilateral.  No results for input(s): GRAMSTAIN, LABORGA in the last 8760 hours.  Assessment and Plan:  Problem List Items Addressed This Visit      Cardiovascular and Mediastinum   PAD (peripheral artery disease) ([place])     Other   History of transmetatarsal amputation of right foot ([place])    Other Visit Diagnoses    Skin ulcer of right foot, limited to breakdown of skin ([place])    -  Primary   Diabetic polyneuropathy associated with type 2 diabetes mellitus ([place])         -Examined patient and discussed the progression of the wound and treatment alternatives. - Excisionally dedbrided ulceration at right amputation stump to healthy bleeding  borders removing nonviable tissue using a sterile chisel blade. Wound measures post debridement as above. Wound was debrided to the level of the dermis with viable wound base exposed to promote healing. Hemostasis was achieved with manuel pressure. Patient tolerated procedure well without any discomfort or anesthesia necessary for this wound debridement. -Applied PRISMA Ag and dry sterile dressing and order home nursing with [facility] to do the same twice weekly.  -Advised patient that he will always have some degree of callusing because of how he is walking with the amputation stump in the future once we get this wound healed would benefit from new custom  insole in shoes - Advised patient to go to the ER or return to office if the wound worsens or if constitutional symptoms are present. -Patient to return to office in 2 weeks for follow up care and evaluation or sooner if problems arise.  Landis Martins, DPM

## 2019-01-29 ENCOUNTER — Other Ambulatory Visit: Payer: Self-pay

## 2019-01-29 ENCOUNTER — Ambulatory Visit (INDEPENDENT_AMBULATORY_CARE_PROVIDER_SITE_OTHER): Payer: BC Managed Care – PPO | Admitting: Sports Medicine

## 2019-01-29 ENCOUNTER — Encounter: Payer: Self-pay | Admitting: Sports Medicine

## 2019-01-29 VITALS — Temp 98.8°F | Resp 16

## 2019-01-29 DIAGNOSIS — E1142 Type 2 diabetes mellitus with diabetic polyneuropathy: Secondary | ICD-10-CM

## 2019-01-29 DIAGNOSIS — I739 Peripheral vascular disease, unspecified: Secondary | ICD-10-CM

## 2019-01-29 DIAGNOSIS — L97511 Non-pressure chronic ulcer of other part of right foot limited to breakdown of skin: Secondary | ICD-10-CM | POA: Diagnosis not present

## 2019-01-29 DIAGNOSIS — Z89431 Acquired absence of right foot: Secondary | ICD-10-CM

## 2019-01-29 NOTE — Progress Notes (Signed)
Subjective: Adrian Bean is a 68 y.o. male patient seen in office for evaluation of ulceration of the right foot amputation stump. Patient states that home nurse said that the area is doing good not having any sharp pain or burning no drainage warmth or redness.  Patient denies nausea vomiting fever chills or any other constitutional symptoms at this time.  Fasting blood sugar does not check last A1c 6.1  Patient Active Problem List   Diagnosis Date Noted  . 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 History of transmetatarsal amputation of right foot ([place]) 07/04/2017  . PAD (peripheral artery disease) ([place]) 06/02/2017  . Sepsis ([place]) 06/02/2017  . DM (diabetes mellitus), type 2 ([place]) 06/02/2017  . Tobacco abuse 06/02/2017  . Tick bite 01/22/2017  . Pain in limb 10/21/2011  . Swelling of limb 10/21/2011   Current Outpatient Medications on File Prior to Visit  Medication Sig Dispense Refill  . albuterol (PROVENTIL) (2.5 MG/3ML) 0.083% nebulizer solution Take 3 mLs (2.5 mg total) by nebulization every 4 (four) hours as needed for wheezing or shortness of breath. 75 mL 12  . Amino Acids-Protein Hydrolys (FEEDING SUPPLEMENT, PRO-STAT SUGAR FREE 64,) LIQD Take 30 mLs by mouth 2 (two) times daily. 900 mL 0  . aspirin EC 81 MG EC tablet Take 1 tablet (81 mg total) by mouth daily.    Marland Kitchen atorvastatin (LIPITOR) 10 MG tablet Take 10 mg by mouth daily.    Marland Kitchen atorvastatin (LIPITOR) 10 MG tablet Take 10 mg by mouth daily at 6 PM.    . bisacodyl (DULCOLAX) 10 MG suppository Place 1 suppository (10 mg total) rectally daily as needed for moderate constipation. 12 suppository 0  . clindamycin (CLEOCIN) 300 MG capsule     . clopidogrel (PLAVIX) 75 MG tablet Take 75 mg by mouth daily.    . collagenase (SANTYL) ointment Apply topically daily. 15 g 0  . feeding supplement, GLUCERNA SHAKE, (GLUCERNA SHAKE) LIQD Take 237 mLs by mouth 3 (three) times daily  between meals.  0  . gabapentin (NEURONTIN) 300 MG capsule Take 300 mg by mouth 3 (three) times daily.    Marland Kitchen gabapentin (NEURONTIN) 300 MG capsule Take 600 mg by mouth 3 (three) times daily.    Marland Kitchen glimepiride (AMARYL) 2 MG tablet Take 2 mg by mouth daily with breakfast.    . glimepiride (AMARYL) 2 MG tablet Take 2 mg by mouth daily with breakfast.    . HEPARIN LOCK FLUSH IV Inject into the vein.    Marland Kitchen HYDROcodone-acetaminophen (NORCO) 10-325 MG tablet Take 1 tablet by mouth every 6 (six) hours as needed for moderate pain.    Marland Kitchen lisinopril (PRINIVIL,ZESTRIL) 10 MG tablet Take 10 mg by mouth daily.    Marland Kitchen lisinopril (PRINIVIL,ZESTRIL) 10 MG tablet Take 10 mg by mouth daily.    Marland Kitchen lisinopril-hydrochlorothiazide (ZESTORETIC) 20-25 MG tablet     . Multiple Vitamin (MULTIVITAMIN WITH MINERALS) TABS tablet Take 1 tablet by mouth daily.    . multivitamin-iron-minerals-folic acid (CENTRUM) chewable tablet Chew 1 tablet by mouth daily.    . nicotine (NICODERM CQ - DOSED IN MG/24 HOURS) 14 mg/24hr patch Place 1 patch (14 mg total) onto the skin daily. 28 patch 0  . omega-3 acid ethyl esters (LOVAZA) 1 g capsule Take 1 g by mouth daily.    . Omega-3 Fatty Acids (FISH OIL PO) Take 1 tablet by mouth daily.    Marland Kitchen oxyCODONE-acetaminophen (PERCOCET) 10-325 MG tablet Take 1  tablet by mouth every 6 (six) hours as needed for pain. 40 tablet 0  . polyethylene glycol (MIRALAX / GLYCOLAX) packet Take 17 g by mouth daily as needed for mild constipation.    . terbinafine (LAMISIL) 250 MG tablet Take 250 mg by mouth daily.     No current facility-administered medications on file prior to visit.    No Known Allergies  Recent Results (from the past 2160 hour(s))  WOUND CULTURE     Status: None   Collection Time: 12/25/18  4:39 PM   Specimen: Foot, Right; Wound   WOUND CULTURE AND SENS  Result Value Ref Range   Gram Stain Result Final report    Organism ID, Bacteria Comment     Comment: No white blood cells seen.    Organism ID, Bacteria Comment     Comment: Many gram positive rods.   Organism ID, Bacteria Comment     Comment: Few gram positive cocci   Aerobic Bacterial Culture Final report    Organism ID, Bacteria Routine flora     Comment: Heavy growth    Objective: There were no vitals filed for this visit.  General: Patient is awake, alert, oriented in no acute distress and is very talkative and tends to make jokes and divert away from the topic of concern  Dermatology: Skin is warm and dry bilateral with a partial thickness ulceration present  Right amputation stump. Ulceration measures 0.1 x 0.1 pre-debridement and 0.2 x 0.1x0.3cm post debridement at the distal aspect.  There is a severely keratotic border with a granular base. The ulceration does not  probe to bone.  There is no malodor, no active drainage, no erythema, trace edema to stump on right.  No other acute signs of infection.   Vascular: Dorsalis Pedis pulse = 1/4 Bilateral,  Posterior Tibial pulse = 0/4 Bilateral,  Capillary Fill Time < 5 seconds  Neurologic: Protective sensation absent bilateral.    Musculosketal: No pain with palpation to ulcerated area.  Status post right foot TMA.  No pain with compression to calves bilateral.  No results for input(s): GRAMSTAIN, LABORGA in the last 8760 hours.  Assessment and Plan:  Problem List Items Addressed This Visit      Cardiovascular and Mediastinum   PAD (peripheral artery disease) ([place])     Other   History of transmetatarsal amputation of right foot ([place])    Other Visit Diagnoses    Skin ulcer of right foot, limited to breakdown of skin ([place])    -  Primary   Diabetic polyneuropathy associated with type 2 diabetes mellitus ([place])         -Examined patient and discussed the progression of the wound and treatment alternatives. - Excisionally dedbrided ulceration at right amputation stump to healthy bleeding borders removing nonviable tissue using a sterile chisel blade. Wound  measures post debridement as above. Wound was debrided to the level of the dermis with viable wound base exposed to promote healing. Hemostasis was achieved with manuel pressure. Patient tolerated procedure well without any discomfort or anesthesia necessary for this wound debridement. -Applied PRISMA Ag and dry sterile dressing and order home nursing with [facility] to do the same twice weekly.  -Advised patient that he will always have some degree of callusing because of how he is walking with the amputation stump in the future once we get this wound healed would benefit from new custom insole in shoes; WILL ASSESS PATIENT FOR SHOES AT NEXT VISIT - Advised patient  to go to the ER or return to office if the wound worsens or if constitutional symptoms are present. -Patient to return to office in 2-3 weeks for follow up care and evaluation or sooner if problems arise.  Landis Martins, DPM

## 2019-02-19 ENCOUNTER — Encounter: Payer: Self-pay | Admitting: Sports Medicine

## 2019-02-19 ENCOUNTER — Ambulatory Visit (INDEPENDENT_AMBULATORY_CARE_PROVIDER_SITE_OTHER): Payer: BC Managed Care – PPO | Admitting: Sports Medicine

## 2019-02-19 ENCOUNTER — Other Ambulatory Visit: Payer: Self-pay

## 2019-02-19 VITALS — Temp 98.7°F | Resp 16

## 2019-02-19 DIAGNOSIS — E1142 Type 2 diabetes mellitus with diabetic polyneuropathy: Secondary | ICD-10-CM

## 2019-02-19 DIAGNOSIS — L97511 Non-pressure chronic ulcer of other part of right foot limited to breakdown of skin: Secondary | ICD-10-CM

## 2019-02-19 DIAGNOSIS — Z89431 Acquired absence of right foot: Secondary | ICD-10-CM

## 2019-02-19 DIAGNOSIS — I739 Peripheral vascular disease, unspecified: Secondary | ICD-10-CM

## 2019-02-19 NOTE — Progress Notes (Signed)
Subjective: Adrian Bean is a 68 y.o. male patient seen in office for evaluation of ulceration of the right foot amputation stump. Patient states that home nurse said that the area is doing good not having any sharp pain or burning no drainage warmth or redness however reports that there is some callus buildup that definitely needs to be trimmed at this visit and dry skin.  Patient denies nausea vomiting fever chills or any other constitutional symptoms at this time.  Fasting blood sugar does not check and does not remember what the last reading was.  A1c 6.1  Patient Active Problem List   Diagnosis Date Noted  . 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 History of transmetatarsal amputation of right foot ([place]) 07/04/2017  . PAD (peripheral artery disease) ([place]) 06/02/2017  . Sepsis ([place]) 06/02/2017  . DM (diabetes mellitus), type 2 ([place]) 06/02/2017  . Tobacco abuse 06/02/2017  . Tick bite 01/22/2017  . Pain in limb 10/21/2011  . Swelling of limb 10/21/2011   Current Outpatient Medications on File Prior to Visit  Medication Sig Dispense Refill  . albuterol (PROVENTIL) (2.5 MG/3ML) 0.083% nebulizer solution Take 3 mLs (2.5 mg total) by nebulization every 4 (four) hours as needed for wheezing or shortness of breath. 75 mL 12  . Amino Acids-Protein Hydrolys (FEEDING SUPPLEMENT, PRO-STAT SUGAR FREE 64,) LIQD Take 30 mLs by mouth 2 (two) times daily. 900 mL 0  . aspirin EC 81 MG EC tablet Take 1 tablet (81 mg total) by mouth daily.    Marland Kitchen atorvastatin (LIPITOR) 10 MG tablet Take 10 mg by mouth daily.    Marland Kitchen atorvastatin (LIPITOR) 10 MG tablet Take 10 mg by mouth daily at 6 PM.    . bisacodyl (DULCOLAX) 10 MG suppository Place 1 suppository (10 mg total) rectally daily as needed for moderate constipation. 12 suppository 0  . clindamycin (CLEOCIN) 300 MG capsule     . clopidogrel (PLAVIX) 75 MG tablet Take 75 mg by mouth daily.    . collagenase  (SANTYL) ointment Apply topically daily. 15 g 0  . feeding supplement, GLUCERNA SHAKE, (GLUCERNA SHAKE) LIQD Take 237 mLs by mouth 3 (three) times daily between meals.  0  . gabapentin (NEURONTIN) 300 MG capsule Take 300 mg by mouth 3 (three) times daily.    Marland Kitchen gabapentin (NEURONTIN) 300 MG capsule Take 600 mg by mouth 3 (three) times daily.    Marland Kitchen glimepiride (AMARYL) 2 MG tablet Take 2 mg by mouth daily with breakfast.    . glimepiride (AMARYL) 2 MG tablet Take 2 mg by mouth daily with breakfast.    . HEPARIN LOCK FLUSH IV Inject into the vein.    Marland Kitchen HYDROcodone-acetaminophen (NORCO) 10-325 MG tablet Take 1 tablet by mouth every 6 (six) hours as needed for moderate pain.    Marland Kitchen lisinopril (PRINIVIL,ZESTRIL) 10 MG tablet Take 10 mg by mouth daily.    Marland Kitchen lisinopril (PRINIVIL,ZESTRIL) 10 MG tablet Take 10 mg by mouth daily.    Marland Kitchen lisinopril-hydrochlorothiazide (ZESTORETIC) 20-25 MG tablet     . Multiple Vitamin (MULTIVITAMIN WITH MINERALS) TABS tablet Take 1 tablet by mouth daily.    . multivitamin-iron-minerals-folic acid (CENTRUM) chewable tablet Chew 1 tablet by mouth daily.    . nicotine (NICODERM CQ - DOSED IN MG/24 HOURS) 14 mg/24hr patch Place 1 patch (14 mg total) onto the skin daily. 28 patch 0  . omega-3 acid ethyl esters (LOVAZA) 1 g capsule Take 1 g by  mouth daily.    . Omega-3 Fatty Acids (FISH OIL PO) Take 1 tablet by mouth daily.    Marland Kitchen oxyCODONE-acetaminophen (PERCOCET) 10-325 MG tablet Take 1 tablet by mouth every 6 (six) hours as needed for pain. 40 tablet 0  . polyethylene glycol (MIRALAX / GLYCOLAX) packet Take 17 g by mouth daily as needed for mild constipation.    . terbinafine (LAMISIL) 250 MG tablet Take 250 mg by mouth daily.     No current facility-administered medications on file prior to visit.    No Known Allergies  Recent Results (from the past 2160 hour(s))  WOUND CULTURE     Status: None   Collection Time: 12/25/18  4:39 PM   Specimen: Foot, Right; Wound   WOUND  CULTURE AND SENS  Result Value Ref Range   Gram Stain Result Final report    Organism ID, Bacteria Comment     Comment: No white blood cells seen.   Organism ID, Bacteria Comment     Comment: Many gram positive rods.   Organism ID, Bacteria Comment     Comment: Few gram positive cocci   Aerobic Bacterial Culture Final report    Organism ID, Bacteria Routine flora     Comment: Heavy growth    Objective: There were no vitals filed for this visit.  General: Patient is awake, alert, oriented in no acute distress and is very talkative and tends to make jokes and divert away from the topic of concern  Dermatology: Skin is warm and dry bilateral with a partial thickness ulceration present  Right amputation stump. Ulceration is healed at the distal aspect now there is a wound at the proximal aspect that measures pre-debridement 0.3 x 0.3 cm and post debridement measures 0.4 x 0.5 cm with a granular base keratotic margins. The ulceration does not probe to bone.  There is no malodor, no active drainage, no erythema, trace edema to stump on right.  No other acute signs of infection.   Vascular: Dorsalis Pedis pulse = 1/4 Bilateral,  Posterior Tibial pulse = 0/4 Bilateral,  Capillary Fill Time < 5 seconds  Neurologic: Protective sensation absent bilateral.    Musculosketal: No pain with palpation to ulcerated area.  Status post right foot TMA.  No pain with compression to calves bilateral.  No results for input(s): GRAMSTAIN, LABORGA in the last 8760 hours.  Assessment and Plan:  Problem List Items Addressed This Visit      Cardiovascular and Mediastinum   PAD (peripheral artery disease) ([place])     Other   History of transmetatarsal amputation of right foot ([place])    Other Visit Diagnoses    Skin ulcer of right foot, limited to breakdown of skin ([place])    -  Primary   Diabetic polyneuropathy associated with type 2 diabetes mellitus ([place])         -Examined patient and discussed the  progression of the wound and treatment alternatives. - Excisionally dedbrided ulceration at right amputation stump to healthy bleeding borders removing nonviable tissue using a sterile chisel blade. Wound measures post debridement as above. Wound was debrided to the level of the dermis with viable wound base exposed to promote healing. Hemostasis was achieved with manuel pressure. Patient tolerated procedure well without any discomfort or anesthesia necessary for this wound debridement. -Applied PRISMA Ag and dry sterile dressing and order home nursing with [facility] to do the same twice weekly.  - Advised patient to go to the ER or return to  office if the wound worsens or if constitutional symptoms are present. -Patient to return to office in 2-3 weeks for follow up care and evaluation or sooner if problems arise.  Advised patient that we will still plan to reassess him for shoes once the area has healed more will benefit from shoes with custom insole.  Landis Martins, DPM

## 2019-03-05 ENCOUNTER — Other Ambulatory Visit: Payer: Self-pay

## 2019-03-05 ENCOUNTER — Encounter: Payer: Self-pay | Admitting: Sports Medicine

## 2019-03-05 ENCOUNTER — Ambulatory Visit (INDEPENDENT_AMBULATORY_CARE_PROVIDER_SITE_OTHER): Payer: BC Managed Care – PPO | Admitting: Sports Medicine

## 2019-03-05 VITALS — Temp 99.1°F | Resp 16

## 2019-03-05 DIAGNOSIS — Z89431 Acquired absence of right foot: Secondary | ICD-10-CM

## 2019-03-05 DIAGNOSIS — I739 Peripheral vascular disease, unspecified: Secondary | ICD-10-CM | POA: Diagnosis not present

## 2019-03-05 DIAGNOSIS — L97511 Non-pressure chronic ulcer of other part of right foot limited to breakdown of skin: Secondary | ICD-10-CM | POA: Diagnosis not present

## 2019-03-05 DIAGNOSIS — E1142 Type 2 diabetes mellitus with diabetic polyneuropathy: Secondary | ICD-10-CM | POA: Diagnosis not present

## 2019-03-05 NOTE — Progress Notes (Signed)
Subjective: Adrian Bean is a 68 y.o. male patient seen in office for evaluation of ulceration of the right foot amputation stump. Patient states that home nurse said that the area is doing good and he is hoping that soon he will be able to stop with the dressing changes.  Patient denies nausea vomiting fever chills or any other constitutional symptoms at this time.  Fasting blood sugar does not check and does not remember last A1c  Patient Active Problem List   Diagnosis Date Noted  . 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 History of transmetatarsal amputation of right foot ([place]) 07/04/2017  . PAD (peripheral artery disease) ([place]) 06/02/2017  . Sepsis ([place]) 06/02/2017  . DM (diabetes mellitus), type 2 ([place]) 06/02/2017  . Tobacco abuse 06/02/2017  . Tick bite 01/22/2017  . Pain in limb 10/21/2011  . Swelling of limb 10/21/2011   Current Outpatient Medications on File Prior to Visit  Medication Sig Dispense Refill  . albuterol (PROVENTIL) (2.5 MG/3ML) 0.083% nebulizer solution Take 3 mLs (2.5 mg total) by nebulization every 4 (four) hours as needed for wheezing or shortness of breath. 75 mL 12  . Amino Acids-Protein Hydrolys (FEEDING SUPPLEMENT, PRO-STAT SUGAR FREE 64,) LIQD Take 30 mLs by mouth 2 (two) times daily. 900 mL 0  . aspirin EC 81 MG EC tablet Take 1 tablet (81 mg total) by mouth daily.    Marland Kitchen atorvastatin (LIPITOR) 10 MG tablet Take 10 mg by mouth daily.    Marland Kitchen atorvastatin (LIPITOR) 10 MG tablet Take 10 mg by mouth daily at 6 PM.    . bisacodyl (DULCOLAX) 10 MG suppository Place 1 suppository (10 mg total) rectally daily as needed for moderate constipation. 12 suppository 0  . clindamycin (CLEOCIN) 300 MG capsule     . clopidogrel (PLAVIX) 75 MG tablet Take 75 mg by mouth daily.    . collagenase (SANTYL) ointment Apply topically daily. 15 g 0  . feeding supplement, GLUCERNA SHAKE, (GLUCERNA SHAKE) LIQD Take 237 mLs by  mouth 3 (three) times daily between meals.  0  . gabapentin (NEURONTIN) 300 MG capsule Take 300 mg by mouth 3 (three) times daily.    Marland Kitchen gabapentin (NEURONTIN) 300 MG capsule Take 600 mg by mouth 3 (three) times daily.    Marland Kitchen glimepiride (AMARYL) 2 MG tablet Take 2 mg by mouth daily with breakfast.    . glimepiride (AMARYL) 2 MG tablet Take 2 mg by mouth daily with breakfast.    . HEPARIN LOCK FLUSH IV Inject into the vein.    Marland Kitchen HYDROcodone-acetaminophen (NORCO) 10-325 MG tablet Take 1 tablet by mouth every 6 (six) hours as needed for moderate pain.    Marland Kitchen lisinopril (PRINIVIL,ZESTRIL) 10 MG tablet Take 10 mg by mouth daily.    Marland Kitchen lisinopril (PRINIVIL,ZESTRIL) 10 MG tablet Take 10 mg by mouth daily.    Marland Kitchen lisinopril-hydrochlorothiazide (ZESTORETIC) 20-25 MG tablet     . Multiple Vitamin (MULTIVITAMIN WITH MINERALS) TABS tablet Take 1 tablet by mouth daily.    . multivitamin-iron-minerals-folic acid (CENTRUM) chewable tablet Chew 1 tablet by mouth daily.    . nicotine (NICODERM CQ - DOSED IN MG/24 HOURS) 14 mg/24hr patch Place 1 patch (14 mg total) onto the skin daily. 28 patch 0  . omega-3 acid ethyl esters (LOVAZA) 1 g capsule Take 1 g by mouth daily.    . Omega-3 Fatty Acids (FISH OIL PO) Take 1 tablet by mouth daily.    Marland Kitchen  oxyCODONE-acetaminophen (PERCOCET) 10-325 MG tablet Take 1 tablet by mouth every 6 (six) hours as needed for pain. 40 tablet 0  . polyethylene glycol (MIRALAX / GLYCOLAX) packet Take 17 g by mouth daily as needed for mild constipation.    . terbinafine (LAMISIL) 250 MG tablet Take 250 mg by mouth daily.     No current facility-administered medications on file prior to visit.    No Known Allergies  Recent Results (from the past 2160 hour(s))  WOUND CULTURE     Status: None   Collection Time: 12/25/18  4:39 PM   Specimen: Foot, Right; Wound   WOUND CULTURE AND SENS  Result Value Ref Range   Gram Stain Result Final report    Organism ID, Bacteria Comment     Comment: No  white blood cells seen.   Organism ID, Bacteria Comment     Comment: Many gram positive rods.   Organism ID, Bacteria Comment     Comment: Few gram positive cocci   Aerobic Bacterial Culture Final report    Organism ID, Bacteria Routine flora     Comment: Heavy growth    Objective: There were no vitals filed for this visit.  General: Patient is awake, alert, oriented in no acute distress and is very talkative and tends to make jokes and divert away from the topic of concern  Dermatology: Skin is warm and dry bilateral with a partial thickness ulceration present  Right amputation stump. Ulceration is healed at the distal aspect now there is a wound at the proximal and distal aspect that measures pre-debridement 0.2 x 0.2 cm and post debridement measures 0.3 x 0.4 cm with a granular base keratotic margins. The ulceration does not probe to bone.  There is no malodor, no active drainage, no erythema, trace edema to stump on right.  No other acute signs of infection.   Vascular: Dorsalis Pedis pulse = 1/4 Bilateral,  Posterior Tibial pulse = 0/4 Bilateral,  Capillary Fill Time < 5 seconds  Neurologic: Protective sensation absent bilateral.    Musculosketal: No pain with palpation to ulcerated area.  Status post right foot TMA.  No pain with compression to calves bilateral.  No results for input(s): GRAMSTAIN, LABORGA in the last 8760 hours.  Assessment and Plan:  Problem List Items Addressed This Visit      Cardiovascular and Mediastinum   PAD (peripheral artery disease) ([place])     Other   History of transmetatarsal amputation of right foot ([place])    Other Visit Diagnoses    Skin ulcer of right foot, limited to breakdown of skin ([place])    -  Primary   Diabetic polyneuropathy associated with type 2 diabetes mellitus ([place])         -Examined patient and discussed the progression of the wound and treatment alternatives. - Excisionally dedbrided ulceration at right amputation stump to  healthy bleeding borders removing nonviable tissue using a sterile chisel blade. Wound measures post debridement as above. Wound was debrided to the level of the dermis with viable wound base exposed to promote healing. Hemostasis was achieved with manuel pressure. Patient tolerated procedure well without any discomfort or anesthesia necessary for this wound debridement. -Applied PRISMA Ag and dry sterile dressing and order home nursing with [facility] to do the same twice weekly.  - Advised patient to go to the ER or return to office if the wound worsens or if constitutional symptoms are present. -Patient to return to office in 2-3 weeks for  follow up care and evaluation or sooner if problems arise.  Advised patient that after he is healed will allow him to wear a clean sock and get him measured for custom insole to offload amputation stump site.  Landis Martins, DPM

## 2019-03-19 ENCOUNTER — Encounter: Payer: Self-pay | Admitting: Sports Medicine

## 2019-03-19 ENCOUNTER — Other Ambulatory Visit: Payer: Self-pay

## 2019-03-19 ENCOUNTER — Ambulatory Visit (INDEPENDENT_AMBULATORY_CARE_PROVIDER_SITE_OTHER): Payer: BC Managed Care – PPO | Admitting: Sports Medicine

## 2019-03-19 DIAGNOSIS — I739 Peripheral vascular disease, unspecified: Secondary | ICD-10-CM

## 2019-03-19 DIAGNOSIS — L97511 Non-pressure chronic ulcer of other part of right foot limited to breakdown of skin: Secondary | ICD-10-CM

## 2019-03-19 DIAGNOSIS — Z89431 Acquired absence of right foot: Secondary | ICD-10-CM

## 2019-03-19 DIAGNOSIS — E1142 Type 2 diabetes mellitus with diabetic polyneuropathy: Secondary | ICD-10-CM

## 2019-03-19 NOTE — Progress Notes (Signed)
Subjective: Adrian Bean is a 68 y.o. male patient seen in office for evaluation of ulceration of the right foot amputation stump.  Patient reports that nurse is helping to change the dressing.  Patient denies nausea vomiting fever chills or any other constitutional symptoms at this time.  Fasting blood sugar does not check and does not remember last A1c like before.   Patient Active Problem List   Diagnosis Date Noted  . Infected surgical wound 06/18/2018  . Peripheral polyneuropathy 06/18/2018  . Mixed dyslipidemia 06/17/2018  . Essential hypertension 12/12/2017  . Need for hepatitis C screening test 12/12/2017  . Screening for colon cancer 12/12/2017  . Diabetic polyneuropathy associated with type 2 diabetes mellitus ([place]) 09/17/2017  . 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 History of transmetatarsal amputation of right foot ([place]) 07/04/2017  . PAD (peripheral artery disease) ([place]) 06/02/2017  . Sepsis ([place]) 06/02/2017  . DM (diabetes mellitus), type 2 ([place]) 06/02/2017  . Tobacco abuse 06/02/2017  . Tick bite 01/22/2017  . Pain in limb 10/21/2011  . Swelling of limb 10/21/2011   Current Outpatient Medications on File Prior to Visit  Medication Sig Dispense Refill  . albuterol (PROVENTIL) (2.5 MG/3ML) 0.083% nebulizer solution Take 3 mLs (2.5 mg total) by nebulization every 4 (four) hours as needed for wheezing or shortness of breath. 75 mL 12  . Amino Acids-Protein Hydrolys (FEEDING SUPPLEMENT, PRO-STAT SUGAR FREE 64,) LIQD Take 30 mLs by mouth 2 (two) times daily. 900 mL 0  . aspirin EC 81 MG EC tablet Take 1 tablet (81 mg total) by mouth daily.    Marland Kitchen atorvastatin (LIPITOR) 10 MG tablet Take 10 mg by mouth daily.    Marland Kitchen atorvastatin (LIPITOR) 10 MG tablet Take 10 mg by mouth daily at 6 PM.    . bisacodyl (DULCOLAX) 10 MG suppository Place 1 suppository (10 mg total) rectally daily as needed for moderate constipation. 12  suppository 0  . clindamycin (CLEOCIN) 300 MG capsule     . clopidogrel (PLAVIX) 75 MG tablet Take 75 mg by mouth daily.    . collagenase (SANTYL) ointment Apply topically daily. 15 g 0  . feeding supplement, GLUCERNA SHAKE, (GLUCERNA SHAKE) LIQD Take 237 mLs by mouth 3 (three) times daily between meals.  0  . gabapentin (NEURONTIN) 300 MG capsule Take 300 mg by mouth 3 (three) times daily.    Marland Kitchen gabapentin (NEURONTIN) 300 MG capsule Take 600 mg by mouth 3 (three) times daily.    Marland Kitchen glimepiride (AMARYL) 2 MG tablet Take 2 mg by mouth daily with breakfast.    . glimepiride (AMARYL) 2 MG tablet Take 2 mg by mouth daily with breakfast.    . HEPARIN LOCK FLUSH IV Inject into the vein.    Marland Kitchen HYDROcodone-acetaminophen (NORCO) 10-325 MG tablet Take 1 tablet by mouth every 6 (six) hours as needed for moderate pain.    Marland Kitchen lisinopril (PRINIVIL,ZESTRIL) 10 MG tablet Take 10 mg by mouth daily.    Marland Kitchen lisinopril (PRINIVIL,ZESTRIL) 10 MG tablet Take 10 mg by mouth daily.    Marland Kitchen lisinopril-hydrochlorothiazide (ZESTORETIC) 20-25 MG tablet     . Multiple Vitamin (MULTIVITAMIN WITH MINERALS) TABS tablet Take 1 tablet by mouth daily.    . multivitamin-iron-minerals-folic acid (CENTRUM) chewable tablet Chew 1 tablet by mouth daily.    . nicotine (NICODERM CQ - DOSED IN MG/24 HOURS) 14 mg/24hr patch Place 1 patch (14 mg total) onto the skin daily. 28 patch 0  .  omega-3 acid ethyl esters (LOVAZA) 1 g capsule Take 1 g by mouth daily.    . Omega-3 Fatty Acids (FISH OIL PO) Take 1 tablet by mouth daily.    Marland Kitchen oxyCODONE-acetaminophen (PERCOCET) 10-325 MG tablet Take 1 tablet by mouth every 6 (six) hours as needed for pain. 40 tablet 0  . polyethylene glycol (MIRALAX / GLYCOLAX) packet Take 17 g by mouth daily as needed for mild constipation.    . terbinafine (LAMISIL) 250 MG tablet Take 250 mg by mouth daily.     No current facility-administered medications on file prior to visit.    No Known Allergies  Recent Results (from  the past 2160 hour(s))  WOUND CULTURE     Status: None   Collection Time: 12/25/18  4:39 PM   Specimen: Foot, Right; Wound   WOUND CULTURE AND SENS  Result Value Ref Range   Gram Stain Result Final report    Organism ID, Bacteria Comment     Comment: No white blood cells seen.   Organism ID, Bacteria Comment     Comment: Many gram positive rods.   Organism ID, Bacteria Comment     Comment: Few gram positive cocci   Aerobic Bacterial Culture Final report    Organism ID, Bacteria Routine flora     Comment: Heavy growth    Objective: There were no vitals filed for this visit.  General: Patient is awake, alert, oriented in no acute distress and is very talkative and tends to make jokes and divert away from the topic of concern  Dermatology: Skin is warm and dry bilateral with a partial thickness ulceration present  Right amputation stump. Ulceration is healed at the distal aspect now there is a wound at the proximal and distal aspect that measures pre-debridement 0.2 x 0.3 cm and post debridement measures 0.3 x 0.5 cm with a granular base keratotic margins. The ulceration does not probe to bone.  There is no malodor, no active drainage, no erythema, trace edema to stump on right.  No other acute signs of infection.   Vascular: Dorsalis Pedis pulse = 1/4 Bilateral,  Posterior Tibial pulse = 0/4 Bilateral,  Capillary Fill Time < 5 seconds  Neurologic: Protective sensation absent bilateral.    Musculosketal: No pain with palpation to ulcerated area.  Status post right foot TMA.  No pain with compression to calves bilateral.  No results for input(s): GRAMSTAIN, LABORGA in the last 8760 hours.  Assessment and Plan:  Problem List Items Addressed This Visit      Cardiovascular and Mediastinum   PAD (peripheral artery disease) ([facility])     Nervous and Auditory   Diabetic polyneuropathy associated with type 2 diabetes mellitus ([place])     Other   History of transmetatarsal amputation of  right foot ([place])    Other Visit Diagnoses    Skin ulcer of right foot, limited to breakdown of skin ([place])    -  Primary     -Examined patient and discussed the progression of the wound and treatment alternatives. - Excisionally dedbrided ulceration at right amputation stump to healthy bleeding borders removing nonviable tissue using a sterile chisel blade. Wound measures post debridement as above. Wound was debrided to the level of the dermis with viable wound base exposed to promote healing. Hemostasis was achieved with manuel pressure. Patient tolerated procedure well without any discomfort or anesthesia necessary for this wound debridement. -Applied PRISMA Ag and dry sterile dressing and order home nursing with [facility] to  do the same twice weekly.  - Advised patient to go to the ER or return to office if the wound worsens or if constitutional symptoms are present. -Patient to return to office in 2-3 weeks for follow up wound care for measurements for insole with toefiller on right and evaluation or sooner if problems arise.   Landis Martins, DPM

## 2019-03-24 ENCOUNTER — Other Ambulatory Visit: Payer: Medicare Other

## 2019-03-24 ENCOUNTER — Other Ambulatory Visit: Payer: Self-pay

## 2019-04-09 ENCOUNTER — Encounter: Payer: Self-pay | Admitting: Sports Medicine

## 2019-04-09 ENCOUNTER — Other Ambulatory Visit: Payer: Self-pay

## 2019-04-09 ENCOUNTER — Ambulatory Visit (INDEPENDENT_AMBULATORY_CARE_PROVIDER_SITE_OTHER): Payer: BC Managed Care – PPO | Admitting: Sports Medicine

## 2019-04-09 DIAGNOSIS — L97511 Non-pressure chronic ulcer of other part of right foot limited to breakdown of skin: Secondary | ICD-10-CM | POA: Diagnosis not present

## 2019-04-09 DIAGNOSIS — Z89431 Acquired absence of right foot: Secondary | ICD-10-CM

## 2019-04-09 DIAGNOSIS — E1142 Type 2 diabetes mellitus with diabetic polyneuropathy: Secondary | ICD-10-CM | POA: Diagnosis not present

## 2019-04-09 DIAGNOSIS — I739 Peripheral vascular disease, unspecified: Secondary | ICD-10-CM

## 2019-04-09 NOTE — Progress Notes (Signed)
Subjective: Adrian Bean is a 68 y.o. male patient seen in office for evaluation of ulceration of the right foot amputation stump.  Patient reports that nurse is helping to change the dressing and reports that it was draining a little more.  Patient denies nausea vomiting fever chills or any other constitutional symptoms at this time.  Fasting blood sugar does not check  Patient Active Problem List   Diagnosis Date Noted  . Infected surgical wound 06/18/2018  . Peripheral polyneuropathy 06/18/2018  . Mixed dyslipidemia 06/17/2018  . Essential hypertension 12/12/2017  . Need for hepatitis C screening test 12/12/2017  . Screening for colon cancer 12/12/2017  . Diabetic polyneuropathy associated with type 2 diabetes mellitus ([place]) 09/17/2017  . 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 History of transmetatarsal amputation of right foot ([place]) 07/04/2017  . PAD (peripheral artery disease) ([place]) 06/02/2017  . Sepsis ([place]) 06/02/2017  . DM (diabetes mellitus), type 2 ([place]) 06/02/2017  . Tobacco abuse 06/02/2017  . Tick bite 01/22/2017  . Pain in limb 10/21/2011  . Swelling of limb 10/21/2011   Current Outpatient Medications on File Prior to Visit  Medication Sig Dispense Refill  . albuterol (PROVENTIL) (2.5 MG/3ML) 0.083% nebulizer solution Take 3 mLs (2.5 mg total) by nebulization every 4 (four) hours as needed for wheezing or shortness of breath. 75 mL 12  . Amino Acids-Protein Hydrolys (FEEDING SUPPLEMENT, PRO-STAT SUGAR FREE 64,) LIQD Take 30 mLs by mouth 2 (two) times daily. 900 mL 0  . aspirin EC 81 MG EC tablet Take 1 tablet (81 mg total) by mouth daily.    Marland Kitchen atorvastatin (LIPITOR) 10 MG tablet Take 10 mg by mouth daily.    Marland Kitchen atorvastatin (LIPITOR) 10 MG tablet Take 10 mg by mouth daily at 6 PM.    . bisacodyl (DULCOLAX) 10 MG suppository Place 1 suppository (10 mg total) rectally daily as needed for moderate constipation. 12  suppository 0  . clindamycin (CLEOCIN) 300 MG capsule     . clopidogrel (PLAVIX) 75 MG tablet Take 75 mg by mouth daily.    . collagenase (SANTYL) ointment Apply topically daily. 15 g 0  . feeding supplement, GLUCERNA SHAKE, (GLUCERNA SHAKE) LIQD Take 237 mLs by mouth 3 (three) times daily between meals.  0  . gabapentin (NEURONTIN) 300 MG capsule Take 300 mg by mouth 3 (three) times daily.    Marland Kitchen gabapentin (NEURONTIN) 300 MG capsule Take 600 mg by mouth 3 (three) times daily.    Marland Kitchen glimepiride (AMARYL) 2 MG tablet Take 2 mg by mouth daily with breakfast.    . glimepiride (AMARYL) 2 MG tablet Take 2 mg by mouth daily with breakfast.    . HEPARIN LOCK FLUSH IV Inject into the vein.    Marland Kitchen HYDROcodone-acetaminophen (NORCO) 10-325 MG tablet Take 1 tablet by mouth every 6 (six) hours as needed for moderate pain.    Marland Kitchen lisinopril (PRINIVIL,ZESTRIL) 10 MG tablet Take 10 mg by mouth daily.    Marland Kitchen lisinopril (PRINIVIL,ZESTRIL) 10 MG tablet Take 10 mg by mouth daily.    Marland Kitchen lisinopril-hydrochlorothiazide (ZESTORETIC) 20-25 MG tablet     . Multiple Vitamin (MULTIVITAMIN WITH MINERALS) TABS tablet Take 1 tablet by mouth daily.    . multivitamin-iron-minerals-folic acid (CENTRUM) chewable tablet Chew 1 tablet by mouth daily.    . nicotine (NICODERM CQ - DOSED IN MG/24 HOURS) 14 mg/24hr patch Place 1 patch (14 mg total) onto the skin daily. 28 patch 0  .  omega-3 acid ethyl esters (LOVAZA) 1 g capsule Take 1 g by mouth daily.    . Omega-3 Fatty Acids (FISH OIL PO) Take 1 tablet by mouth daily.    Marland Kitchen oxyCODONE-acetaminophen (PERCOCET) 10-325 MG tablet Take 1 tablet by mouth every 6 (six) hours as needed for pain. 40 tablet 0  . polyethylene glycol (MIRALAX / GLYCOLAX) packet Take 17 g by mouth daily as needed for mild constipation.    . terbinafine (LAMISIL) 250 MG tablet Take 250 mg by mouth daily.     No current facility-administered medications on file prior to visit.    No Known Allergies  No results found for  this or any previous visit (from the past 2160 hour(s)).  Objective: There were no vitals filed for this visit.  General: Patient is awake, alert, oriented in no acute distress and is very talkative and tends to make jokes and divert away from the topic of concern  Dermatology: Skin is warm and dry bilateral with a partial thickness ulceration present  Right amputation stump. Ulceration is healed at the distal aspect now there is a wound at the proximal and distal aspect that measures pre-debridement 0.3 x 0.3 cm and post debridement measures 0.5 x 0.5x0.5 cm with a granular base keratotic margins. The ulceration does not probe to bone.  There is no malodor, no active drainage, no erythema, trace edema to stump on right.  No other acute signs of infection.   Vascular: Dorsalis Pedis pulse = 1/4 Bilateral,  Posterior Tibial pulse = 0/4 Bilateral,  Capillary Fill Time < 5 seconds  Neurologic: Protective sensation absent bilateral.    Musculosketal: No pain with palpation to ulcerated area.  Status post right foot TMA.  No pain with compression to calves bilateral.  No results for input(s): GRAMSTAIN, LABORGA in the last 8760 hours.  Assessment and Plan:  Problem List Items Addressed This Visit      Cardiovascular and Mediastinum   PAD (peripheral artery disease) ([place])     Endocrine   Diabetic polyneuropathy associated with type 2 diabetes mellitus ([place])     Other   History of transmetatarsal amputation of right foot ([place])    Other Visit Diagnoses    Skin ulcer of right foot, limited to breakdown of skin ([place])    -  Primary     -Re-examined patient and discussed the progression of the wound and treatment alternatives. -Excisionally dedbrided ulceration at right amputation stump to healthy bleeding borders removing nonviable tissue using a sterile chisel blade. Wound measures post debridement as above. Wound was debrided to the level of the dermis with viable wound base exposed to  promote healing. Hemostasis was achieved with manuel pressure. Patient tolerated procedure well without any discomfort or anesthesia necessary for this wound debridement. -Applied PRISMA Ag and dry sterile dressing and order home nursing with [facility] to do the same twice weekly.  -Impression captured on the left foot; await custom insoles - Advised patient to go to the ER or return to office if the wound worsens or if constitutional symptoms are present. -Patient to return to office in 2-3 weeks for follow up wound care. Landis Martins, DPM

## 2019-04-23 ENCOUNTER — Other Ambulatory Visit: Payer: Self-pay

## 2019-04-23 ENCOUNTER — Ambulatory Visit (INDEPENDENT_AMBULATORY_CARE_PROVIDER_SITE_OTHER): Payer: BC Managed Care – PPO | Admitting: Sports Medicine

## 2019-04-23 ENCOUNTER — Encounter: Payer: Self-pay | Admitting: Sports Medicine

## 2019-04-23 DIAGNOSIS — L97511 Non-pressure chronic ulcer of other part of right foot limited to breakdown of skin: Secondary | ICD-10-CM | POA: Diagnosis not present

## 2019-04-23 DIAGNOSIS — I739 Peripheral vascular disease, unspecified: Secondary | ICD-10-CM

## 2019-04-23 DIAGNOSIS — Z89431 Acquired absence of right foot: Secondary | ICD-10-CM

## 2019-04-23 DIAGNOSIS — E1142 Type 2 diabetes mellitus with diabetic polyneuropathy: Secondary | ICD-10-CM

## 2019-04-23 NOTE — Progress Notes (Signed)
Subjective: Adrian Bean is a 68 y.o. male patient seen in office for follow up evaluation of ulceration of the right foot amputation stump.  Patient reports that nurse is helping to change the dressing and reports that it is doing better with no drainage.  Patient denies nausea vomiting fever chills or any other constitutional symptoms at this time.  Fasting blood sugar does not check   Patient Active Problem List   Diagnosis Date Noted  . Infected surgical wound 06/18/2018  . Peripheral polyneuropathy 06/18/2018  . Mixed dyslipidemia 06/17/2018  . Essential hypertension 12/12/2017  . Need for hepatitis C screening test 12/12/2017  . Screening for colon cancer 12/12/2017  . Diabetic polyneuropathy associated with type 2 diabetes mellitus ([place]) 09/17/2017  . 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 History of transmetatarsal amputation of right foot ([place]) 07/04/2017  . PAD (peripheral artery disease) ([place]) 06/02/2017  . Sepsis ([place]) 06/02/2017  . DM (diabetes mellitus), type 2 ([place]) 06/02/2017  . Tobacco abuse 06/02/2017  . Tick bite 01/22/2017  . Pain in limb 10/21/2011  . Swelling of limb 10/21/2011   Current Outpatient Medications on File Prior to Visit  Medication Sig Dispense Refill  . albuterol (PROVENTIL) (2.5 MG/3ML) 0.083% nebulizer solution Take 3 mLs (2.5 mg total) by nebulization every 4 (four) hours as needed for wheezing or shortness of breath. 75 mL 12  . Amino Acids-Protein Hydrolys (FEEDING SUPPLEMENT, PRO-STAT SUGAR FREE 64,) LIQD Take 30 mLs by mouth 2 (two) times daily. 900 mL 0  . aspirin EC 81 MG EC tablet Take 1 tablet (81 mg total) by mouth daily.    Marland Kitchen atorvastatin (LIPITOR) 10 MG tablet Take 10 mg by mouth daily.    Marland Kitchen atorvastatin (LIPITOR) 10 MG tablet Take 10 mg by mouth daily at 6 PM.    . bisacodyl (DULCOLAX) 10 MG suppository Place 1 suppository (10 mg total) rectally daily as needed for moderate  constipation. 12 suppository 0  . clindamycin (CLEOCIN) 300 MG capsule     . clopidogrel (PLAVIX) 75 MG tablet Take 75 mg by mouth daily.    . collagenase (SANTYL) ointment Apply topically daily. 15 g 0  . feeding supplement, GLUCERNA SHAKE, (GLUCERNA SHAKE) LIQD Take 237 mLs by mouth 3 (three) times daily between meals.  0  . gabapentin (NEURONTIN) 300 MG capsule Take 300 mg by mouth 3 (three) times daily.    Marland Kitchen gabapentin (NEURONTIN) 300 MG capsule Take 600 mg by mouth 3 (three) times daily.    Marland Kitchen glimepiride (AMARYL) 2 MG tablet Take 2 mg by mouth daily with breakfast.    . glimepiride (AMARYL) 2 MG tablet Take 2 mg by mouth daily with breakfast.    . HEPARIN LOCK FLUSH IV Inject into the vein.    Marland Kitchen HYDROcodone-acetaminophen (NORCO) 10-325 MG tablet Take 1 tablet by mouth every 6 (six) hours as needed for moderate pain.    Marland Kitchen lisinopril (PRINIVIL,ZESTRIL) 10 MG tablet Take 10 mg by mouth daily.    Marland Kitchen lisinopril (PRINIVIL,ZESTRIL) 10 MG tablet Take 10 mg by mouth daily.    Marland Kitchen lisinopril-hydrochlorothiazide (ZESTORETIC) 20-25 MG tablet     . Multiple Vitamin (MULTIVITAMIN WITH MINERALS) TABS tablet Take 1 tablet by mouth daily.    . multivitamin-iron-minerals-folic acid (CENTRUM) chewable tablet Chew 1 tablet by mouth daily.    . nicotine (NICODERM CQ - DOSED IN MG/24 HOURS) 14 mg/24hr patch Place 1 patch (14 mg total) onto the skin daily.  28 patch 0  . omega-3 acid ethyl esters (LOVAZA) 1 g capsule Take 1 g by mouth daily.    . Omega-3 Fatty Acids (FISH OIL PO) Take 1 tablet by mouth daily.    Marland Kitchen oxyCODONE-acetaminophen (PERCOCET) 10-325 MG tablet Take 1 tablet by mouth every 6 (six) hours as needed for pain. 40 tablet 0  . polyethylene glycol (MIRALAX / GLYCOLAX) packet Take 17 g by mouth daily as needed for mild constipation.    . terbinafine (LAMISIL) 250 MG tablet Take 250 mg by mouth daily.     No current facility-administered medications on file prior to visit.    No Known Allergies  No  results found for this or any previous visit (from the past 2160 hour(s)).  Objective: There were no vitals filed for this visit.  General: Patient is awake, alert, oriented in no acute distress and is very talkative and tends to make jokes and divert away from the topic of concern  Dermatology: Skin is warm and dry bilateral with a partial thickness ulceration present  Right amputation stump. Ulceration is healed at the distal aspect now there is a wound at the proximal and distal aspect that measures pre-debridement 0.2 x 0.2 cm and post debridement measures 0.4 x 0.4x0.2 cm with a granular base keratotic margins. The ulceration does not probe to bone.  There is no malodor, no active drainage, no erythema, trace edema to stump on right.  No other acute signs of infection.   Vascular: Dorsalis Pedis pulse = 1/4 Bilateral,  Posterior Tibial pulse = 0/4 Bilateral,  Capillary Fill Time < 5 seconds  Neurologic: Protective sensation absent bilateral.    Musculosketal: No pain with palpation to ulcerated area.  Status post right foot TMA.  No pain with compression to calves bilateral.  No results for input(s): GRAMSTAIN, LABORGA in the last 8760 hours.  Assessment and Plan:  Problem List Items Addressed This Visit      Cardiovascular and Mediastinum   PAD (peripheral artery disease) ([place])     Endocrine   Diabetic polyneuropathy associated with type 2 diabetes mellitus ([place])     Other   History of transmetatarsal amputation of right foot ([place])    Other Visit Diagnoses    Skin ulcer of right foot, limited to breakdown of skin ([place])    -  Primary     -Re-examined patient and discussed the progression of the wound and treatment alternatives. -Excisionally dedbrided ulceration at right amputation stump to healthy bleeding borders removing nonviable tissue using a sterile chisel blade. Wound measures post debridement as above. Wound was debrided to the level of the dermis with viable wound  base exposed to promote healing. Hemostasis was achieved with manuel pressure. Patient tolerated procedure well without any discomfort or anesthesia necessary for this wound debridement. -Applied PRISMA Ag and dry sterile dressing and order home nursing with [facility] to do the same twice weekly like before and may apply foot miracle cream to dry skin areas like we did on today's visit as well.  -Awaiting custom insoles foam impression was made at previous visit - Advised patient to go to the ER or return to office if the wound worsens or if constitutional symptoms are present. -Patient to return to office in 2-3 weeks for follow up wound care. Landis Martins, DPM

## 2019-05-14 ENCOUNTER — Other Ambulatory Visit: Payer: Self-pay

## 2019-05-14 ENCOUNTER — Ambulatory Visit (INDEPENDENT_AMBULATORY_CARE_PROVIDER_SITE_OTHER): Payer: BC Managed Care – PPO | Admitting: Sports Medicine

## 2019-05-14 ENCOUNTER — Encounter: Payer: Self-pay | Admitting: Sports Medicine

## 2019-05-14 DIAGNOSIS — E1142 Type 2 diabetes mellitus with diabetic polyneuropathy: Secondary | ICD-10-CM | POA: Diagnosis not present

## 2019-05-14 DIAGNOSIS — I739 Peripheral vascular disease, unspecified: Secondary | ICD-10-CM | POA: Diagnosis not present

## 2019-05-14 DIAGNOSIS — L97511 Non-pressure chronic ulcer of other part of right foot limited to breakdown of skin: Secondary | ICD-10-CM

## 2019-05-14 DIAGNOSIS — Z89431 Acquired absence of right foot: Secondary | ICD-10-CM

## 2019-05-14 NOTE — Progress Notes (Signed)
Subjective: Adrian Bean is a 68 y.o. male patient seen in office for follow up evaluation of ulceration of the right foot amputation stump.  Patient reports that nurse is helping to change the dressing and reports that it is doing a little bit worse. Patient denies nausea vomiting fever chills or any other constitutional symptoms at this time.  Fasting blood sugar does not check   Patient Active Problem List   Diagnosis Date Noted  . Infected surgical wound 06/18/2018  . Peripheral polyneuropathy 06/18/2018  . Mixed dyslipidemia 06/17/2018  . Essential hypertension 12/12/2017  . Need for hepatitis C screening test 12/12/2017  . Screening for colon cancer 12/12/2017  . Diabetic polyneuropathy associated with type 2 diabetes mellitus ([place]) 09/17/2017  . 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 History of transmetatarsal amputation of right foot ([place]) 07/04/2017  . PAD (peripheral artery disease) ([place]) 06/02/2017  . Sepsis ([place]) 06/02/2017  . DM (diabetes mellitus), type 2 ([place]) 06/02/2017  . Tobacco abuse 06/02/2017  . Tick bite 01/22/2017  . Pain in limb 10/21/2011  . Swelling of limb 10/21/2011   Current Outpatient Medications on File Prior to Visit  Medication Sig Dispense Refill  . albuterol (PROVENTIL) (2.5 MG/3ML) 0.083% nebulizer solution Take 3 mLs (2.5 mg total) by nebulization every 4 (four) hours as needed for wheezing or shortness of breath. 75 mL 12  . Amino Acids-Protein Hydrolys (FEEDING SUPPLEMENT, PRO-STAT SUGAR FREE 64,) LIQD Take 30 mLs by mouth 2 (two) times daily. 900 mL 0  . aspirin EC 81 MG EC tablet Take 1 tablet (81 mg total) by mouth daily.    Marland Kitchen atorvastatin (LIPITOR) 10 MG tablet Take 10 mg by mouth daily.    Marland Kitchen atorvastatin (LIPITOR) 10 MG tablet Take 10 mg by mouth daily at 6 PM.    . bisacodyl (DULCOLAX) 10 MG suppository Place 1 suppository (10 mg total) rectally daily as needed for moderate  constipation. 12 suppository 0  . clindamycin (CLEOCIN) 300 MG capsule     . clopidogrel (PLAVIX) 75 MG tablet Take 75 mg by mouth daily.    . collagenase (SANTYL) ointment Apply topically daily. 15 g 0  . feeding supplement, GLUCERNA SHAKE, (GLUCERNA SHAKE) LIQD Take 237 mLs by mouth 3 (three) times daily between meals.  0  . gabapentin (NEURONTIN) 300 MG capsule Take 300 mg by mouth 3 (three) times daily.    Marland Kitchen gabapentin (NEURONTIN) 300 MG capsule Take 600 mg by mouth 3 (three) times daily.    Marland Kitchen glimepiride (AMARYL) 2 MG tablet Take 2 mg by mouth daily with breakfast.    . glimepiride (AMARYL) 2 MG tablet Take 2 mg by mouth daily with breakfast.    . HEPARIN LOCK FLUSH IV Inject into the vein.    Marland Kitchen HYDROcodone-acetaminophen (NORCO) 10-325 MG tablet Take 1 tablet by mouth every 6 (six) hours as needed for moderate pain.    Marland Kitchen lisinopril (PRINIVIL,ZESTRIL) 10 MG tablet Take 10 mg by mouth daily.    Marland Kitchen lisinopril (PRINIVIL,ZESTRIL) 10 MG tablet Take 10 mg by mouth daily.    Marland Kitchen lisinopril-hydrochlorothiazide (ZESTORETIC) 20-25 MG tablet     . Multiple Vitamin (MULTIVITAMIN WITH MINERALS) TABS tablet Take 1 tablet by mouth daily.    . multivitamin-iron-minerals-folic acid (CENTRUM) chewable tablet Chew 1 tablet by mouth daily.    . nicotine (NICODERM CQ - DOSED IN MG/24 HOURS) 14 mg/24hr patch Place 1 patch (14 mg total) onto the skin daily. [place]  patch 0  . omega-3 acid ethyl esters (LOVAZA) 1 g capsule Take 1 g by mouth daily.    . Omega-3 Fatty Acids (FISH OIL PO) Take 1 tablet by mouth daily.    Marland Kitchen oxyCODONE-acetaminophen (PERCOCET) 10-325 MG tablet Take 1 tablet by mouth every 6 (six) hours as needed for pain. 40 tablet 0  . polyethylene glycol (MIRALAX / GLYCOLAX) packet Take 17 g by mouth daily as needed for mild constipation.    . terbinafine (LAMISIL) 250 MG tablet Take 250 mg by mouth daily.     No current facility-administered medications on file prior to visit.    No Known Allergies  No  results found for this or any previous visit (from the past 2160 hour(s)).  Objective: There were no vitals filed for this visit.  General: Patient is awake, alert, oriented in no acute distress and is very talkative and tends to make jokes and divert away from the topic of concern  Dermatology: Skin is warm and dry bilateral with a partial thickness ulceration present  Right amputation stump. Ulceration is healed at the distal aspect now there is a wound at the proximal and distal aspect that measures pre-debridement 1 x 0.2 x 0.2 cm and post debridement measure 2 x 0.5 x 0.2 cm with a granular base macerated keratotic margins. The ulceration does not probe to bone.  There is no malodor, no active drainage, no erythema, trace edema to stump on right.  No other acute signs of infection.   Vascular: Dorsalis Pedis pulse = 1/4 Bilateral,  Posterior Tibial pulse = 0/4 Bilateral,  Capillary Fill Time < 5 seconds  Neurologic: Protective sensation absent bilateral.    Musculosketal: No pain with palpation to ulcerated area.  Status post right foot TMA.  No pain with compression to calves bilateral.  No results for input(s): GRAMSTAIN, LABORGA in the last 8760 hours.  Assessment and Plan:  Problem List Items Addressed This Visit      Cardiovascular and Mediastinum   PAD (peripheral artery disease) ([place])     Endocrine   Diabetic polyneuropathy associated with type 2 diabetes mellitus ([place])     Other   History of transmetatarsal amputation of right foot ([place])    Other Visit Diagnoses    Skin ulcer of right foot, limited to breakdown of skin ([place])    -  Primary     -Re-examined patient and discussed the progression of the wound and treatment alternatives. -Excisionally dedbrided ulceration at right amputation stump to healthy bleeding borders removing nonviable tissue using a sterile chisel blade. Wound measures post debridement as above. Wound was debrided to the level of the dermis with  viable wound base exposed to promote healing. Hemostasis was achieved with manuel pressure. Patient tolerated procedure well without any discomfort or anesthesia necessary for this wound debridement. -Applied Iodosorb and dry sterile dressing and order home nursing with [facility] to do the same twice weekly like before and may apply foot miracle cream to dry skin areas like we did on today's visit as well.  -Patient is awaiting custom insole - Advised patient to go to the ER or return to office if the wound worsens or if constitutional symptoms are present. -Patient to return to office in 2-3 weeks for follow up wound care.  We will discussed the possibility of surgery if wound continues to worsen. Landis Martins, DPM

## 2019-05-17 ENCOUNTER — Telehealth: Payer: Self-pay | Admitting: *Deleted

## 2019-05-17 NOTE — Telephone Encounter (Signed)
-----   Message from [place], [place] sent at 05/14/2019 12:42 PM EST ----- Regarding: Home nursing orders Send new orders to change twice weekly at minimum being Iodosorb and dry dressing to amputation stump site wound on right foot.

## 2019-05-17 NOTE — Telephone Encounter (Signed)
I called pt for the home health care agency and pt states he is established with [facility]. Faxed Dr. Leeanne Rio 05/14/2019 12:42pm orders to [facility].

## 2019-06-04 ENCOUNTER — Other Ambulatory Visit: Payer: Self-pay

## 2019-06-04 ENCOUNTER — Ambulatory Visit (INDEPENDENT_AMBULATORY_CARE_PROVIDER_SITE_OTHER): Payer: BC Managed Care – PPO | Admitting: Sports Medicine

## 2019-06-04 ENCOUNTER — Encounter: Payer: Self-pay | Admitting: Sports Medicine

## 2019-06-04 DIAGNOSIS — I739 Peripheral vascular disease, unspecified: Secondary | ICD-10-CM | POA: Diagnosis not present

## 2019-06-04 DIAGNOSIS — Z89431 Acquired absence of right foot: Secondary | ICD-10-CM

## 2019-06-04 DIAGNOSIS — E1142 Type 2 diabetes mellitus with diabetic polyneuropathy: Secondary | ICD-10-CM | POA: Diagnosis not present

## 2019-06-04 DIAGNOSIS — L97511 Non-pressure chronic ulcer of other part of right foot limited to breakdown of skin: Secondary | ICD-10-CM

## 2019-06-04 NOTE — Addendum Note (Signed)
Addended by: Allean Found on: 06/04/2019 05:18 PM   Modules accepted: Orders

## 2019-06-04 NOTE — Progress Notes (Signed)
Subjective: Adrian Bean is a 68 y.o. male patient seen in office for follow up evaluation of ulceration of the right foot amputation stump.  Patient reports that nurse is helping to change the dressing and reports that it looks about the same no improvement with Iodosorb. Patient denies nausea vomiting fever chills or any other constitutional symptoms at this time.  Fasting blood sugar does not check as previously noted  Patient Active Problem List   Diagnosis Date Noted  . Infected surgical wound 06/18/2018  . Peripheral polyneuropathy 06/18/2018  . Mixed dyslipidemia 06/17/2018  . Essential hypertension 12/12/2017  . Need for hepatitis C screening test 12/12/2017  . Screening for colon cancer 12/12/2017  . Diabetic polyneuropathy associated with type 2 diabetes mellitus ([place]) 09/17/2017  . 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 History of transmetatarsal amputation of right foot ([place]) 07/04/2017  . PAD (peripheral artery disease) ([place]) 06/02/2017  . Sepsis ([place]) 06/02/2017  . DM (diabetes mellitus), type 2 ([place]) 06/02/2017  . Tobacco abuse 06/02/2017  . Tick bite 01/22/2017  . Pain in limb 10/21/2011  . Swelling of limb 10/21/2011   Current Outpatient Medications on File Prior to Visit  Medication Sig Dispense Refill  . albuterol (PROVENTIL) (2.5 MG/3ML) 0.083% nebulizer solution Take 3 mLs (2.5 mg total) by nebulization every 4 (four) hours as needed for wheezing or shortness of breath. 75 mL 12  . Amino Acids-Protein Hydrolys (FEEDING SUPPLEMENT, PRO-STAT SUGAR FREE 64,) LIQD Take 30 mLs by mouth 2 (two) times daily. 900 mL 0  . aspirin EC 81 MG EC tablet Take 1 tablet (81 mg total) by mouth daily.    Marland Kitchen atorvastatin (LIPITOR) 10 MG tablet Take 10 mg by mouth daily.    Marland Kitchen atorvastatin (LIPITOR) 10 MG tablet Take 10 mg by mouth daily at 6 PM.    . bisacodyl (DULCOLAX) 10 MG suppository Place 1 suppository (10 mg total)  rectally daily as needed for moderate constipation. 12 suppository 0  . clindamycin (CLEOCIN) 300 MG capsule     . clopidogrel (PLAVIX) 75 MG tablet Take 75 mg by mouth daily.    . collagenase (SANTYL) ointment Apply topically daily. 15 g 0  . feeding supplement, GLUCERNA SHAKE, (GLUCERNA SHAKE) LIQD Take 237 mLs by mouth 3 (three) times daily between meals.  0  . gabapentin (NEURONTIN) 300 MG capsule Take 300 mg by mouth 3 (three) times daily.    Marland Kitchen gabapentin (NEURONTIN) 300 MG capsule Take 600 mg by mouth 3 (three) times daily.    Marland Kitchen glimepiride (AMARYL) 2 MG tablet Take 2 mg by mouth daily with breakfast.    . glimepiride (AMARYL) 2 MG tablet Take 2 mg by mouth daily with breakfast.    . HEPARIN LOCK FLUSH IV Inject into the vein.    Marland Kitchen HYDROcodone-acetaminophen (NORCO) 10-325 MG tablet Take 1 tablet by mouth every 6 (six) hours as needed for moderate pain.    Marland Kitchen lisinopril (PRINIVIL,ZESTRIL) 10 MG tablet Take 10 mg by mouth daily.    Marland Kitchen lisinopril (PRINIVIL,ZESTRIL) 10 MG tablet Take 10 mg by mouth daily.    Marland Kitchen lisinopril-hydrochlorothiazide (ZESTORETIC) 20-25 MG tablet     . Multiple Vitamin (MULTIVITAMIN WITH MINERALS) TABS tablet Take 1 tablet by mouth daily.    . multivitamin-iron-minerals-folic acid (CENTRUM) chewable tablet Chew 1 tablet by mouth daily.    . nicotine (NICODERM CQ - DOSED IN MG/24 HOURS) 14 mg/24hr patch Place 1 patch (14 mg total) onto  the skin daily. 28 patch 0  . omega-3 acid ethyl esters (LOVAZA) 1 g capsule Take 1 g by mouth daily.    . Omega-3 Fatty Acids (FISH OIL PO) Take 1 tablet by mouth daily.    Marland Kitchen oxyCODONE-acetaminophen (PERCOCET) 10-325 MG tablet Take 1 tablet by mouth every 6 (six) hours as needed for pain. 40 tablet 0  . polyethylene glycol (MIRALAX / GLYCOLAX) packet Take 17 g by mouth daily as needed for mild constipation.    . terbinafine (LAMISIL) 250 MG tablet Take 250 mg by mouth daily.     No current facility-administered medications on file prior to  visit.    No Known Allergies  No results found for this or any previous visit (from the past 2160 hour(s)).  Objective: There were no vitals filed for this visit.  General: Patient is awake, alert, oriented in no acute distress and is very talkative and tends to make jokes and divert away from the topic of concern  Dermatology: Skin is warm and dry bilateral with a partial thickness ulceration present  Right amputation stump. Ulceration is healed at the distal aspect now there is a wound at the proximal and distal aspect that measures pre-debridement 1 x 0.2 x 0.2 cm and post debridement measure 1.5 x 0.4 x 0.2 cm with a granular base macerated keratotic margins. The ulceration does not probe to bone.  There is no malodor, no active drainage, no erythema, trace edema to stump on right.  No other acute signs of infection.   Vascular: Dorsalis Pedis pulse = 1/4 Bilateral,  Posterior Tibial pulse = 0/4 Bilateral,  Capillary Fill Time < 5 seconds  Neurologic: Protective sensation absent bilateral.    Musculosketal: No pain with palpation to ulcerated area.  Status post right foot TMA.  No pain with compression to calves bilateral.  No results for input(s): GRAMSTAIN, LABORGA in the last 8760 hours.  Assessment and Plan:  Problem List Items Addressed This Visit      Cardiovascular and Mediastinum   PAD (peripheral artery disease) ([place])     Endocrine   Diabetic polyneuropathy associated with type 2 diabetes mellitus ([place])     Other   History of transmetatarsal amputation of right foot ([place])    Other Visit Diagnoses    Skin ulcer of right foot, limited to breakdown of skin ([place])    -  Primary     -Re-examined patient and discussed the progression of the wound and treatment alternatives. -Excisionally dedbrided ulceration at right amputation stump to healthy bleeding borders removing nonviable tissue using a sterile chisel blade. Wound measures post debridement as above. Wound was  debrided to the level of the dermis with viable wound base exposed to promote healing. Hemostasis was achieved with manuel pressure. Patient tolerated procedure well without any discomfort or anesthesia necessary for this wound debridement. -Wound culture was obtained will call patient if need antibiotics -Applied Prisma and dry sterile dressing and order home nursing with [facility] to do the same twice weekly like before and may apply foot miracle cream to dry skin areas like we did on today's visit as well.  -Patient is awaiting custom insole like before - Advised patient to go to the ER or return to office if the wound worsens or if constitutional symptoms are present. -Patient to return to office in 2-3 weeks for follow up wound care.  We will discussed the possibility of surgery if wound continues to worsen however at this time appears  about the same as previous no worse. Landis Martins, DPM

## 2019-06-08 ENCOUNTER — Other Ambulatory Visit: Payer: Self-pay | Admitting: Sports Medicine

## 2019-06-08 ENCOUNTER — Telehealth: Payer: Self-pay

## 2019-06-08 LAB — WOUND CULTURE

## 2019-06-08 MED ORDER — CIPROFLOXACIN HCL 500 MG PO TABS: 500.0000 mg | ORAL_TABLET | Freq: Two times a day (BID) | ORAL | 0 refills | Status: DC

## 2019-06-08 NOTE — Telephone Encounter (Signed)
Called and Spoke with Pt about wound cx results and about his Rx that was sent to his pharmacy. Pt stated understanding.

## 2019-06-08 NOTE — Telephone Encounter (Signed)
-----   Message from Landis Martins, [place] sent at 06/08/2019 12:46 PM EST ----- Please let patient know that I sent Cipro to pharmacy for + Culture

## 2019-06-08 NOTE — Progress Notes (Signed)
Cipro sent for + culture

## 2019-06-23 ENCOUNTER — Encounter: Payer: Self-pay | Admitting: Sports Medicine

## 2019-06-23 ENCOUNTER — Other Ambulatory Visit: Payer: Self-pay

## 2019-06-23 ENCOUNTER — Ambulatory Visit (INDEPENDENT_AMBULATORY_CARE_PROVIDER_SITE_OTHER): Payer: BC Managed Care – PPO | Admitting: Sports Medicine

## 2019-06-23 DIAGNOSIS — E1142 Type 2 diabetes mellitus with diabetic polyneuropathy: Secondary | ICD-10-CM

## 2019-06-23 DIAGNOSIS — I739 Peripheral vascular disease, unspecified: Secondary | ICD-10-CM

## 2019-06-23 DIAGNOSIS — L97511 Non-pressure chronic ulcer of other part of right foot limited to breakdown of skin: Secondary | ICD-10-CM

## 2019-06-23 DIAGNOSIS — Z89431 Acquired absence of right foot: Secondary | ICD-10-CM

## 2019-06-23 NOTE — Progress Notes (Signed)
Subjective: Adrian Bean is a 68 y.o. male patient seen in office for follow up evaluation of ulceration of the right foot amputation stump.  Patient reports that nurse has not been saying anything and not sure how it is doing does not feel any worse than before.  Reports some bloody drainage on the gauze layers and the dressing is changed but otherwise doing okay.  Patient denies nausea vomiting fever chills or any other constitutional symptoms at this time.  Fasting blood sugar does not check as previously noted  Patient Active Problem List   Diagnosis Date Noted  . Infected surgical wound 06/18/2018  . Peripheral polyneuropathy 06/18/2018  . Mixed dyslipidemia 06/17/2018  . Essential hypertension 12/12/2017  . Need for hepatitis C screening test 12/12/2017  . Screening for colon cancer 12/12/2017  . Diabetic polyneuropathy associated with type 2 diabetes mellitus ([place]) 09/17/2017  . 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 History of transmetatarsal amputation of right foot ([place]) 07/04/2017  . PAD (peripheral artery disease) ([place]) 06/02/2017  . Sepsis ([place]) 06/02/2017  . DM (diabetes mellitus), type 2 ([place]) 06/02/2017  . Tobacco abuse 06/02/2017  . Tick bite 01/22/2017  . Pain in limb 10/21/2011  . Swelling of limb 10/21/2011   Current Outpatient Medications on File Prior to Visit  Medication Sig Dispense Refill  . albuterol (PROVENTIL) (2.5 MG/3ML) 0.083% nebulizer solution Take 3 mLs (2.5 mg total) by nebulization every 4 (four) hours as needed for wheezing or shortness of breath. 75 mL 12  . Amino Acids-Protein Hydrolys (FEEDING SUPPLEMENT, PRO-STAT SUGAR FREE 64,) LIQD Take 30 mLs by mouth 2 (two) times daily. 900 mL 0  . aspirin EC 81 MG EC tablet Take 1 tablet (81 mg total) by mouth daily.    Marland Kitchen atorvastatin (LIPITOR) 10 MG tablet Take 10 mg by mouth daily.    Marland Kitchen atorvastatin (LIPITOR) 10 MG tablet Take 10 mg by mouth  daily at 6 PM.    . bisacodyl (DULCOLAX) 10 MG suppository Place 1 suppository (10 mg total) rectally daily as needed for moderate constipation. 12 suppository 0  . ciprofloxacin (CIPRO) 500 MG tablet Take 1 tablet (500 mg total) by mouth 2 (two) times daily. 20 tablet 0  . clindamycin (CLEOCIN) 300 MG capsule     . clopidogrel (PLAVIX) 75 MG tablet Take 75 mg by mouth daily.    . collagenase (SANTYL) ointment Apply topically daily. 15 g 0  . feeding supplement, GLUCERNA SHAKE, (GLUCERNA SHAKE) LIQD Take 237 mLs by mouth 3 (three) times daily between meals.  0  . gabapentin (NEURONTIN) 300 MG capsule Take 300 mg by mouth 3 (three) times daily.    Marland Kitchen gabapentin (NEURONTIN) 300 MG capsule Take 600 mg by mouth 3 (three) times daily.    Marland Kitchen glimepiride (AMARYL) 2 MG tablet Take 2 mg by mouth daily with breakfast.    . glimepiride (AMARYL) 2 MG tablet Take 2 mg by mouth daily with breakfast.    . HEPARIN LOCK FLUSH IV Inject into the vein.    Marland Kitchen HYDROcodone-acetaminophen (NORCO) 10-325 MG tablet Take 1 tablet by mouth every 6 (six) hours as needed for moderate pain.    Marland Kitchen lisinopril (PRINIVIL,ZESTRIL) 10 MG tablet Take 10 mg by mouth daily.    Marland Kitchen lisinopril (PRINIVIL,ZESTRIL) 10 MG tablet Take 10 mg by mouth daily.    Marland Kitchen lisinopril-hydrochlorothiazide (ZESTORETIC) 20-25 MG tablet     . Multiple Vitamin (MULTIVITAMIN WITH MINERALS) TABS tablet Take  1 tablet by mouth daily.    . multivitamin-iron-minerals-folic acid (CENTRUM) chewable tablet Chew 1 tablet by mouth daily.    . nicotine (NICODERM CQ - DOSED IN MG/24 HOURS) 14 mg/24hr patch Place 1 patch (14 mg total) onto the skin daily. 28 patch 0  . omega-3 acid ethyl esters (LOVAZA) 1 g capsule Take 1 g by mouth daily.    . Omega-3 Fatty Acids (FISH OIL PO) Take 1 tablet by mouth daily.    Marland Kitchen oxyCODONE-acetaminophen (PERCOCET) 10-325 MG tablet Take 1 tablet by mouth every 6 (six) hours as needed for pain. 40 tablet 0  . polyethylene glycol (MIRALAX /  GLYCOLAX) packet Take 17 g by mouth daily as needed for mild constipation.    . terbinafine (LAMISIL) 250 MG tablet Take 250 mg by mouth daily.     No current facility-administered medications on file prior to visit.   No Known Allergies  Recent Results (from the past 2160 hour(s))  WOUND CULTURE     Status: Abnormal   Collection Time: 06/04/19  9:36 AM   Specimen: Foot, Right; Wound   WOUND CULTURE AND SENS  Result Value Ref Range   Gram Stain Result Final report    Organism ID, Bacteria Comment     Comment: Rare white blood cells.   Organism ID, Bacteria Comment     Comment: Many gram positive cocci.   Organism ID, Bacteria Comment     Comment: Few gram positive rods.   Organism ID, Bacteria Comment     Comment: Few gram negative rods.   Aerobic Bacterial Culture Final report (A)    Organism ID, Bacteria Comment (A)     Comment: Enterobacter cloacae complex Heavy growth    Organism ID, Bacteria Routine flora     Comment: Heavy growth   Antimicrobial Susceptibility Comment     Comment:       ** S = Susceptible; I = Intermediate; R = Resistant **                    P = Positive; N = Negative             MICS are expressed in micrograms per mL    Antibiotic                 RSLT#1    RSLT#2    RSLT#3    RSLT#4 Amoxicillin/Clavulanic Acid    R Cefazolin                      R Cefepime                       S Cefuroxime                     R Ciprofloxacin                  S Ertapenem                      S Gentamicin                     S Imipenem                       S Levofloxacin                   S Meropenem  S Tetracycline                   S Tobramycin                     S Trimethoprim/Sulfa             S     Objective: There were no vitals filed for this visit.  General: Patient is awake, alert, oriented in no acute distress and is very talkative and tends to make jokes and divert away from the topic of concern  Dermatology: Skin is warm  and dry bilateral with a partial thickness ulceration present  Right amputation stump. Ulceration is healed at the distal aspect now there is a wound at the proximal and distal aspect that measures pre-debridement 1 x 0.2 x 0.2 cm and post debridement measure 1.2 x 0.3 x 0.2 cm with a granular base macerated keratotic margins. The ulceration does not probe to bone.  There is no malodor, no active drainage, no erythema, trace edema to stump on right.  No other acute signs of infection.   Vascular: Dorsalis Pedis pulse = 1/4 Bilateral,  Posterior Tibial pulse = 0/4 Bilateral,  Capillary Fill Time < 5 seconds  Neurologic: Protective sensation absent bilateral.    Musculosketal: No pain with palpation to ulcerated area.  Status post right foot TMA.  No pain with compression to calves bilateral.  No results for input(s): GRAMSTAIN, LABORGA in the last 8760 hours.  Assessment and Plan:  Problem List Items Addressed This Visit      Cardiovascular and Mediastinum   PAD (peripheral artery disease) ([place])     Endocrine   Diabetic polyneuropathy associated with type 2 diabetes mellitus ([place])     Other   History of transmetatarsal amputation of right foot ([place])    Other Visit Diagnoses    Skin ulcer of right foot, limited to breakdown of skin ([place])    -  Primary     -Re-examined patient and discussed the progression of the wound and treatment alternatives. -Excisionally dedbrided ulceration at right amputation stump to healthy bleeding borders removing nonviable tissue using a sterile chisel blade. Wound measures post debridement as above. Wound was debrided to the level of the dermis with viable wound base exposed to promote healing. Hemostasis was achieved with manuel pressure. Patient tolerated procedure well without any discomfort or anesthesia necessary for this wound debridement. -Antibiotics completed -Applied Prisma and dry sterile dressing and order home nursing with [facility] to do the  same twice weekly like before and may apply foot miracle cream to dry skin areas like we did on today's visit as well.  -Patient is awaiting custom insole like before - Advised patient to go to the ER or return to office if the wound worsens or if constitutional symptoms are present. -Patient to return to office in 2-3 weeks for follow up wound care.    Landis Martins, DPM

## 2019-07-16 ENCOUNTER — Encounter: Payer: Self-pay | Admitting: Sports Medicine

## 2019-07-16 ENCOUNTER — Ambulatory Visit (INDEPENDENT_AMBULATORY_CARE_PROVIDER_SITE_OTHER): Payer: BC Managed Care – PPO | Admitting: Sports Medicine

## 2019-07-16 ENCOUNTER — Other Ambulatory Visit: Payer: Self-pay

## 2019-07-16 DIAGNOSIS — E1142 Type 2 diabetes mellitus with diabetic polyneuropathy: Secondary | ICD-10-CM

## 2019-07-16 DIAGNOSIS — L97511 Non-pressure chronic ulcer of other part of right foot limited to breakdown of skin: Secondary | ICD-10-CM

## 2019-07-16 DIAGNOSIS — Z89431 Acquired absence of right foot: Secondary | ICD-10-CM

## 2019-07-16 DIAGNOSIS — I739 Peripheral vascular disease, unspecified: Secondary | ICD-10-CM

## 2019-07-16 NOTE — Progress Notes (Signed)
Subjective: Adrian Bean is a 69 y.o. male patient seen in office for follow up evaluation of ulceration of the right foot amputation stump.  Patient reports that nurse is helping to change the dressing and he thinks that is doing okay because the nurses has not said anything but has been applying the Prisma and dry dressing as instructed. Patient denies nausea vomiting fever chills or any other constitutional symptoms at this time.  Fasting blood sugar does not check as previously noted last A1c 6  Patient Active Problem List   Diagnosis Date Noted  . Infected surgical wound 06/18/2018  . Peripheral polyneuropathy 06/18/2018  . Mixed dyslipidemia 06/17/2018  . Essential hypertension 12/12/2017  . Need for hepatitis C screening test 12/12/2017  . Screening for colon cancer 12/12/2017  . Diabetic polyneuropathy associated with type 2 diabetes mellitus ([place]) 09/17/2017  . 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 History of transmetatarsal amputation of right foot ([place]) 07/04/2017  . PAD (peripheral artery disease) ([place]) 06/02/2017  . Sepsis ([place]) 06/02/2017  . DM (diabetes mellitus), type 2 ([place]) 06/02/2017  . Tobacco abuse 06/02/2017  . Tick bite 01/22/2017  . Pain in limb 10/21/2011  . Swelling of limb 10/21/2011   Current Outpatient Medications on File Prior to Visit  Medication Sig Dispense Refill  . albuterol (PROVENTIL) (2.5 MG/3ML) 0.083% nebulizer solution Take 3 mLs (2.5 mg total) by nebulization every 4 (four) hours as needed for wheezing or shortness of breath. 75 mL 12  . Amino Acids-Protein Hydrolys (FEEDING SUPPLEMENT, PRO-STAT SUGAR FREE 64,) LIQD Take 30 mLs by mouth 2 (two) times daily. 900 mL 0  . aspirin EC 81 MG EC tablet Take 1 tablet (81 mg total) by mouth daily.    Marland Kitchen atorvastatin (LIPITOR) 10 MG tablet Take 10 mg by mouth daily.    Marland Kitchen atorvastatin (LIPITOR) 10 MG tablet Take 10 mg by mouth daily at 6 PM.    .  bisacodyl (DULCOLAX) 10 MG suppository Place 1 suppository (10 mg total) rectally daily as needed for moderate constipation. 12 suppository 0  . ciprofloxacin (CIPRO) 500 MG tablet Take 1 tablet (500 mg total) by mouth 2 (two) times daily. 20 tablet 0  . clindamycin (CLEOCIN) 300 MG capsule     . clopidogrel (PLAVIX) 75 MG tablet Take 75 mg by mouth daily.    . collagenase (SANTYL) ointment Apply topically daily. 15 g 0  . feeding supplement, GLUCERNA SHAKE, (GLUCERNA SHAKE) LIQD Take 237 mLs by mouth 3 (three) times daily between meals.  0  . gabapentin (NEURONTIN) 300 MG capsule Take 300 mg by mouth 3 (three) times daily.    Marland Kitchen gabapentin (NEURONTIN) 300 MG capsule Take 600 mg by mouth 3 (three) times daily.    Marland Kitchen glimepiride (AMARYL) 2 MG tablet Take 2 mg by mouth daily with breakfast.    . glimepiride (AMARYL) 2 MG tablet Take 2 mg by mouth daily with breakfast.    . HEPARIN LOCK FLUSH IV Inject into the vein.    Marland Kitchen HYDROcodone-acetaminophen (NORCO) 10-325 MG tablet Take 1 tablet by mouth every 6 (six) hours as needed for moderate pain.    Marland Kitchen lisinopril (PRINIVIL,ZESTRIL) 10 MG tablet Take 10 mg by mouth daily.    Marland Kitchen lisinopril (PRINIVIL,ZESTRIL) 10 MG tablet Take 10 mg by mouth daily.    Marland Kitchen lisinopril-hydrochlorothiazide (ZESTORETIC) 20-25 MG tablet     . Multiple Vitamin (MULTIVITAMIN WITH MINERALS) TABS tablet Take 1 tablet by mouth  daily.    . multivitamin-iron-minerals-folic acid (CENTRUM) chewable tablet Chew 1 tablet by mouth daily.    . nicotine (NICODERM CQ - DOSED IN MG/24 HOURS) 14 mg/24hr patch Place 1 patch (14 mg total) onto the skin daily. 28 patch 0  . omega-3 acid ethyl esters (LOVAZA) 1 g capsule Take 1 g by mouth daily.    . Omega-3 Fatty Acids (FISH OIL PO) Take 1 tablet by mouth daily.    Marland Kitchen oxyCODONE-acetaminophen (PERCOCET) 10-325 MG tablet Take 1 tablet by mouth every 6 (six) hours as needed for pain. 40 tablet 0  . polyethylene glycol (MIRALAX / GLYCOLAX) packet Take 17 g  by mouth daily as needed for mild constipation.    . terbinafine (LAMISIL) 250 MG tablet Take 250 mg by mouth daily.     No current facility-administered medications on file prior to visit.   No Known Allergies  Recent Results (from the past 2160 hour(s))  WOUND CULTURE     Status: Abnormal   Collection Time: 06/04/19  9:36 AM   Specimen: Foot, Right; Wound   WOUND CULTURE AND SENS  Result Value Ref Range   Gram Stain Result Final report    Organism ID, Bacteria Comment     Comment: Rare white blood cells.   Organism ID, Bacteria Comment     Comment: Many gram positive cocci.   Organism ID, Bacteria Comment     Comment: Few gram positive rods.   Organism ID, Bacteria Comment     Comment: Few gram negative rods.   Aerobic Bacterial Culture Final report (A)    Organism ID, Bacteria Comment (A)     Comment: Enterobacter cloacae complex Heavy growth    Organism ID, Bacteria Routine flora     Comment: Heavy growth   Antimicrobial Susceptibility Comment     Comment:       ** S = Susceptible; I = Intermediate; R = Resistant **                    P = Positive; N = Negative             MICS are expressed in micrograms per mL    Antibiotic                 RSLT#1    RSLT#2    RSLT#3    RSLT#4 Amoxicillin/Clavulanic Acid    R Cefazolin                      R Cefepime                       S Cefuroxime                     R Ciprofloxacin                  S Ertapenem                      S Gentamicin                     S Imipenem                       S Levofloxacin                   S Meropenem  S Tetracycline                   S Tobramycin                     S Trimethoprim/Sulfa             S     Objective: There were no vitals filed for this visit.  General: Patient is awake, alert, oriented in no acute distress and is very talkative and tends to make jokes and divert away from the topic of concern  Dermatology: Skin is warm and dry bilateral with a  partial thickness ulceration present  Right amputation stump. Ulceration is healed at the distal aspect now there is a wound at the proximal and distal aspect that measures pre-debridement 1.5 x 1 x 0.2 cm and post debridement measure 2 x 1.5 x 0.3 cm larger than previous, with a granular base macerated keratotic margins. The ulceration does not probe to bone.  There is no malodor, no active drainage, no erythema, trace edema to stump on right.  No other acute signs of infection.   Vascular: Dorsalis Pedis pulse = 1/4 Bilateral,  Posterior Tibial pulse = 0/4 Bilateral,  Capillary Fill Time < 5 seconds  Neurologic: Protective sensation absent bilateral.    Musculosketal: No pain with palpation to ulcerated area.  Status post right foot TMA.  No pain with compression to calves bilateral.  No results for input(s): GRAMSTAIN, LABORGA in the last 8760 hours.  Assessment and Plan:  Problem List Items Addressed This Visit      Cardiovascular and Mediastinum   PAD (peripheral artery disease) ([place])     Endocrine   Diabetic polyneuropathy associated with type 2 diabetes mellitus ([place])     Other   History of transmetatarsal amputation of right foot ([place])    Other Visit Diagnoses    Skin ulcer of right foot, limited to breakdown of skin ([place])    -  Primary     -Re-examined patient and discussed the progression of the wound and treatment alternatives. -Excisionally dedbrided ulceration at right amputation stump to healthy bleeding borders removing nonviable tissue using a sterile chisel blade. Wound measures post debridement as above. Wound was debrided to the level of the dermis with viable wound base exposed to promote healing. Hemostasis was achieved with manuel pressure. Patient tolerated procedure well without any discomfort or anesthesia necessary for this wound debridement. -Applied Prisma and dry sterile dressing and order home nursing with [facility] to do the same twice weekly like before  and may apply foot miracle cream to dry skin areas like we did on today's visit as well.  -Patient will benefit from tight medical wound graft to help with healing of wound since his wound has been chronic in nature and I have been treating patient since July for this wound; discussed case with rep Rodman Key sides -Patient is awaiting custom insole like before - Advised patient to go to the ER or return to office if the wound worsens or if constitutional symptoms are present. -Patient to return to office in 2-3 weeks for follow up wound care.   Landis Martins, DPM

## 2019-08-06 ENCOUNTER — Ambulatory Visit (INDEPENDENT_AMBULATORY_CARE_PROVIDER_SITE_OTHER): Payer: BC Managed Care – PPO | Admitting: Sports Medicine

## 2019-08-06 ENCOUNTER — Encounter: Payer: Self-pay | Admitting: Sports Medicine

## 2019-08-06 ENCOUNTER — Other Ambulatory Visit: Payer: Self-pay

## 2019-08-06 DIAGNOSIS — E1142 Type 2 diabetes mellitus with diabetic polyneuropathy: Secondary | ICD-10-CM | POA: Diagnosis not present

## 2019-08-06 DIAGNOSIS — L97511 Non-pressure chronic ulcer of other part of right foot limited to breakdown of skin: Secondary | ICD-10-CM

## 2019-08-06 DIAGNOSIS — I739 Peripheral vascular disease, unspecified: Secondary | ICD-10-CM | POA: Diagnosis not present

## 2019-08-06 DIAGNOSIS — Z89431 Acquired absence of right foot: Secondary | ICD-10-CM

## 2019-08-06 NOTE — Progress Notes (Signed)
Subjective: Adrian Bean is a 69 y.o. male patient seen in office for follow up evaluation of ulceration of the right foot amputation stump.  Patient reports that nurse is helping to change the dressing and he thinks that is not too bad and nurses has been applying the Prisma and dry dressing as instructed. Patient denies nausea vomiting fever chills or any other constitutional symptoms at this time.  Fasting blood sugar does not check as previously noted last A1c 6  Patient Active Problem List   Diagnosis Date Noted  . Infected surgical wound 06/18/2018  . Peripheral polyneuropathy 06/18/2018  . Mixed dyslipidemia 06/17/2018  . Essential hypertension 12/12/2017  . Need for hepatitis C screening test 12/12/2017  . Screening for colon cancer 12/12/2017  . Diabetic polyneuropathy associated with type 2 diabetes mellitus ([place]) 09/17/2017  . 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 History of transmetatarsal amputation of right foot ([place]) 07/04/2017  . PAD (peripheral artery disease) ([place]) 06/02/2017  . Sepsis ([place]) 06/02/2017  . DM (diabetes mellitus), type 2 ([place]) 06/02/2017  . Tobacco abuse 06/02/2017  . Tick bite 01/22/2017  . Pain in limb 10/21/2011  . Swelling of limb 10/21/2011   Current Outpatient Medications on File Prior to Visit  Medication Sig Dispense Refill  . albuterol (PROVENTIL) (2.5 MG/3ML) 0.083% nebulizer solution Take 3 mLs (2.5 mg total) by nebulization every 4 (four) hours as needed for wheezing or shortness of breath. 75 mL 12  . Amino Acids-Protein Hydrolys (FEEDING SUPPLEMENT, PRO-STAT SUGAR FREE 64,) LIQD Take 30 mLs by mouth 2 (two) times daily. 900 mL 0  . aspirin EC 81 MG EC tablet Take 1 tablet (81 mg total) by mouth daily.    Marland Kitchen atorvastatin (LIPITOR) 10 MG tablet Take 10 mg by mouth daily.    Marland Kitchen atorvastatin (LIPITOR) 10 MG tablet Take 10 mg by mouth daily at 6 PM.    . bisacodyl (DULCOLAX) 10 MG  suppository Place 1 suppository (10 mg total) rectally daily as needed for moderate constipation. 12 suppository 0  . ciprofloxacin (CIPRO) 500 MG tablet Take 1 tablet (500 mg total) by mouth 2 (two) times daily. 20 tablet 0  . clindamycin (CLEOCIN) 300 MG capsule     . clopidogrel (PLAVIX) 75 MG tablet Take 75 mg by mouth daily.    . collagenase (SANTYL) ointment Apply topically daily. 15 g 0  . feeding supplement, GLUCERNA SHAKE, (GLUCERNA SHAKE) LIQD Take 237 mLs by mouth 3 (three) times daily between meals.  0  . gabapentin (NEURONTIN) 300 MG capsule Take 300 mg by mouth 3 (three) times daily.    Marland Kitchen gabapentin (NEURONTIN) 300 MG capsule Take 600 mg by mouth 3 (three) times daily.    Marland Kitchen glimepiride (AMARYL) 2 MG tablet Take 2 mg by mouth daily with breakfast.    . glimepiride (AMARYL) 2 MG tablet Take 2 mg by mouth daily with breakfast.    . HEPARIN LOCK FLUSH IV Inject into the vein.    Marland Kitchen HYDROcodone-acetaminophen (NORCO) 10-325 MG tablet Take 1 tablet by mouth every 6 (six) hours as needed for moderate pain.    Marland Kitchen lisinopril (PRINIVIL,ZESTRIL) 10 MG tablet Take 10 mg by mouth daily.    Marland Kitchen lisinopril (PRINIVIL,ZESTRIL) 10 MG tablet Take 10 mg by mouth daily.    Marland Kitchen lisinopril-hydrochlorothiazide (ZESTORETIC) 20-25 MG tablet     . Multiple Vitamin (MULTIVITAMIN WITH MINERALS) TABS tablet Take 1 tablet by mouth daily.    Marland Kitchen  multivitamin-iron-minerals-folic acid (CENTRUM) chewable tablet Chew 1 tablet by mouth daily.    . nicotine (NICODERM CQ - DOSED IN MG/24 HOURS) 14 mg/24hr patch Place 1 patch (14 mg total) onto the skin daily. 28 patch 0  . omega-3 acid ethyl esters (LOVAZA) 1 g capsule Take 1 g by mouth daily.    . Omega-3 Fatty Acids (FISH OIL PO) Take 1 tablet by mouth daily.    Marland Kitchen oxyCODONE-acetaminophen (PERCOCET) 10-325 MG tablet Take 1 tablet by mouth every 6 (six) hours as needed for pain. 40 tablet 0  . polyethylene glycol (MIRALAX / GLYCOLAX) packet Take 17 g by mouth daily as needed for  mild constipation.    . terbinafine (LAMISIL) 250 MG tablet Take 250 mg by mouth daily.     No current facility-administered medications on file prior to visit.   No Known Allergies  Recent Results (from the past 2160 hour(s))  WOUND CULTURE     Status: Abnormal   Collection Time: 06/04/19  9:36 AM   Specimen: Foot, Right; Wound   WOUND CULTURE AND SENS  Result Value Ref Range   Gram Stain Result Final report    Organism ID, Bacteria Comment     Comment: Rare white blood cells.   Organism ID, Bacteria Comment     Comment: Many gram positive cocci.   Organism ID, Bacteria Comment     Comment: Few gram positive rods.   Organism ID, Bacteria Comment     Comment: Few gram negative rods.   Aerobic Bacterial Culture Final report (A)    Organism ID, Bacteria Comment (A)     Comment: Enterobacter cloacae complex Heavy growth    Organism ID, Bacteria Routine flora     Comment: Heavy growth   Antimicrobial Susceptibility Comment     Comment:       ** S = Susceptible; I = Intermediate; R = Resistant **                    P = Positive; N = Negative             MICS are expressed in micrograms per mL    Antibiotic                 RSLT#1    RSLT#2    RSLT#3    RSLT#4 Amoxicillin/Clavulanic Acid    R Cefazolin                      R Cefepime                       S Cefuroxime                     R Ciprofloxacin                  S Ertapenem                      S Gentamicin                     S Imipenem                       S Levofloxacin                   S Meropenem  S Tetracycline                   S Tobramycin                     S Trimethoprim/Sulfa             S     Objective: There were no vitals filed for this visit.  General: Patient is awake, alert, oriented in no acute distress and is very talkative and tends to make jokes and divert away from the topic of concern  Dermatology: Skin is warm and dry bilateral with a partial thickness ulceration  present  Right amputation stump. Ulceration is healed at the distal aspect now there is a wound at the proximal and distal aspect that measures pre-debridement 1.5 x 0.5 x 0.2 cm and post debridement measure 2 x 0.5 x 0.3 cm larger than previous, with a granular base macerated keratotic margins. The ulceration does not probe to bone.  There is no malodor, no active drainage, no erythema, trace edema to stump on right.  No other acute signs of infection.   Vascular: Dorsalis Pedis pulse = 1/4 Bilateral,  Posterior Tibial pulse = 0/4 Bilateral,  Capillary Fill Time < 5 seconds  Neurologic: Protective sensation absent bilateral.    Musculosketal: No pain with palpation to ulcerated area.  Status post right foot TMA.  No pain with compression to calves bilateral.  No results for input(s): GRAMSTAIN, LABORGA in the last 8760 hours.  Assessment and Plan:  Problem List Items Addressed This Visit      Cardiovascular and Mediastinum   PAD (peripheral artery disease) ([place])     Endocrine   Diabetic polyneuropathy associated with type 2 diabetes mellitus ([place])     Other   History of transmetatarsal amputation of right foot ([place])    Other Visit Diagnoses    Skin ulcer of right foot, limited to breakdown of skin ([place])    -  Primary     -Re-examined patient and discussed the progression of the wound and treatment alternatives. -Excisionally dedbrided ulceration at right amputation stump to healthy bleeding borders removing nonviable tissue using a sterile chisel blade. Wound measures post debridement as above. Wound was debrided to the level of the dermis with viable wound base exposed to promote healing. Hemostasis was achieved with manuel pressure. Patient tolerated procedure well without any discomfort or anesthesia necessary for this wound debridement. -Applied Prisma and dry sterile dressing and order home nursing with [facility] to do the same twice weekly like before and may apply foot miracle  cream to dry skin areas like we did on today's visit as well.  -Patient will benefit from tight medical wound graft to help with healing of wound since his wound has been chronic in nature and I have been treating patient since July for this wound; will reach out to [facility] for options  -Patient is awaiting custom insole like before once we get wound healed - Advised patient to go to the ER or return to office if the wound worsens or if constitutional symptoms are present. -Patient to return to office in 2-3 weeks for follow up wound care.   Landis Martins, DPM

## 2019-08-12 ENCOUNTER — Other Ambulatory Visit: Payer: Self-pay

## 2019-08-24 ENCOUNTER — Telehealth: Payer: Self-pay | Admitting: *Deleted

## 2019-08-24 NOTE — Telephone Encounter (Signed)
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

## 2019-08-24 NOTE — Telephone Encounter (Signed)
Emailed message to [employer] for graft options.

## 2019-08-25 NOTE — Telephone Encounter (Signed)
Adrian Bean - [facility] states pt's primary insurance Medicare will cover Dr. Leeanne Rio preference in grafts. Required form completed with ICD.10 codes of 08-29-2019, clinicals from the beginning of each month pt was seen by Dr. Cannon Kettle and 06/2019 and 11/2018 wound cultures, demographics prepared to be faxed with Dr. Leeanne Rio graft preference.

## 2019-08-27 ENCOUNTER — Encounter: Payer: Self-pay | Admitting: Sports Medicine

## 2019-08-27 ENCOUNTER — Ambulatory Visit (INDEPENDENT_AMBULATORY_CARE_PROVIDER_SITE_OTHER): Payer: BC Managed Care – PPO | Admitting: Sports Medicine

## 2019-08-27 ENCOUNTER — Other Ambulatory Visit: Payer: Self-pay

## 2019-08-27 DIAGNOSIS — I739 Peripheral vascular disease, unspecified: Secondary | ICD-10-CM | POA: Diagnosis not present

## 2019-08-27 DIAGNOSIS — L97511 Non-pressure chronic ulcer of other part of right foot limited to breakdown of skin: Secondary | ICD-10-CM | POA: Diagnosis not present

## 2019-08-27 DIAGNOSIS — Z89431 Acquired absence of right foot: Secondary | ICD-10-CM

## 2019-08-27 DIAGNOSIS — E1142 Type 2 diabetes mellitus with diabetic polyneuropathy: Secondary | ICD-10-CM | POA: Diagnosis not present

## 2019-08-27 NOTE — Progress Notes (Signed)
Subjective: Adrian Bean is a 69 y.o. male patient seen in office for follow up evaluation of ulceration of the right foot amputation stump.  Patient reports that nurse is helping to change the dressing and he thinks that things are about the same.  Reports that his home nurse is about to change to a different one because his current nurse is getting married and moving out of state.  Patient denies nausea vomiting fever chills or any other constitutional symptoms at this time.  Fasting blood sugar does not check as previously noted last A1c 6 same as previous  Patient Active Problem List   Diagnosis Date Noted  . Infected surgical wound 06/18/2018  . Peripheral polyneuropathy 06/18/2018  . Mixed dyslipidemia 06/17/2018  . Essential hypertension 12/12/2017  . Need for hepatitis C screening test 12/12/2017  . Screening for colon cancer 12/12/2017  . Diabetic polyneuropathy associated with type 2 diabetes mellitus ([place]) 09/17/2017  . 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 History of transmetatarsal amputation of right foot ([place]) 07/04/2017  . PAD (peripheral artery disease) ([place]) 06/02/2017  . Sepsis ([place]) 06/02/2017  . DM (diabetes mellitus), type 2 ([place]) 06/02/2017  . Tobacco abuse 06/02/2017  . Tick bite 01/22/2017  . Pain in limb 10/21/2011  . Swelling of limb 10/21/2011   Current Outpatient Medications on File Prior to Visit  Medication Sig Dispense Refill  . albuterol (PROVENTIL) (2.5 MG/3ML) 0.083% nebulizer solution Take 3 mLs (2.5 mg total) by nebulization every 4 (four) hours as needed for wheezing or shortness of breath. 75 mL 12  . Amino Acids-Protein Hydrolys (FEEDING SUPPLEMENT, PRO-STAT SUGAR FREE 64,) LIQD Take 30 mLs by mouth 2 (two) times daily. 900 mL 0  . aspirin EC 81 MG EC tablet Take 1 tablet (81 mg total) by mouth daily.    Marland Kitchen atorvastatin (LIPITOR) 10 MG tablet Take 10 mg by mouth daily.    Marland Kitchen atorvastatin  (LIPITOR) 10 MG tablet Take 10 mg by mouth daily at 6 PM.    . bisacodyl (DULCOLAX) 10 MG suppository Place 1 suppository (10 mg total) rectally daily as needed for moderate constipation. 12 suppository 0  . ciprofloxacin (CIPRO) 500 MG tablet Take 1 tablet (500 mg total) by mouth 2 (two) times daily. 20 tablet 0  . clindamycin (CLEOCIN) 300 MG capsule     . clopidogrel (PLAVIX) 75 MG tablet Take 75 mg by mouth daily.    . collagenase (SANTYL) ointment Apply topically daily. 15 g 0  . feeding supplement, GLUCERNA SHAKE, (GLUCERNA SHAKE) LIQD Take 237 mLs by mouth 3 (three) times daily between meals.  0  . gabapentin (NEURONTIN) 300 MG capsule Take 300 mg by mouth 3 (three) times daily.    Marland Kitchen gabapentin (NEURONTIN) 300 MG capsule Take 600 mg by mouth 3 (three) times daily.    Marland Kitchen glimepiride (AMARYL) 2 MG tablet Take 2 mg by mouth daily with breakfast.    . glimepiride (AMARYL) 2 MG tablet Take 2 mg by mouth daily with breakfast.    . HEPARIN LOCK FLUSH IV Inject into the vein.    Marland Kitchen HYDROcodone-acetaminophen (NORCO) 10-325 MG tablet Take 1 tablet by mouth every 6 (six) hours as needed for moderate pain.    Marland Kitchen lisinopril (PRINIVIL,ZESTRIL) 10 MG tablet Take 10 mg by mouth daily.    Marland Kitchen lisinopril (PRINIVIL,ZESTRIL) 10 MG tablet Take 10 mg by mouth daily.    Marland Kitchen lisinopril-hydrochlorothiazide (ZESTORETIC) 20-25 MG tablet     .  Multiple Vitamin (MULTIVITAMIN WITH MINERALS) TABS tablet Take 1 tablet by mouth daily.    . multivitamin-iron-minerals-folic acid (CENTRUM) chewable tablet Chew 1 tablet by mouth daily.    . nicotine (NICODERM CQ - DOSED IN MG/24 HOURS) 14 mg/24hr patch Place 1 patch (14 mg total) onto the skin daily. 28 patch 0  . omega-3 acid ethyl esters (LOVAZA) 1 g capsule Take 1 g by mouth daily.    . Omega-3 Fatty Acids (FISH OIL PO) Take 1 tablet by mouth daily.    Marland Kitchen oxyCODONE-acetaminophen (PERCOCET) 10-325 MG tablet Take 1 tablet by mouth every 6 (six) hours as needed for pain. 40 tablet 0   . polyethylene glycol (MIRALAX / GLYCOLAX) packet Take 17 g by mouth daily as needed for mild constipation.    . terbinafine (LAMISIL) 250 MG tablet Take 250 mg by mouth daily.     No current facility-administered medications on file prior to visit.   No Known Allergies  Recent Results (from the past 2160 hour(s))  WOUND CULTURE     Status: Abnormal   Collection Time: 06/04/19  9:36 AM   Specimen: Foot, Right; Wound   WOUND CULTURE AND SENS  Result Value Ref Range   Gram Stain Result Final report    Organism ID, Bacteria Comment     Comment: Rare white blood cells.   Organism ID, Bacteria Comment     Comment: Many gram positive cocci.   Organism ID, Bacteria Comment     Comment: Few gram positive rods.   Organism ID, Bacteria Comment     Comment: Few gram negative rods.   Aerobic Bacterial Culture Final report (A)    Organism ID, Bacteria Comment (A)     Comment: Enterobacter cloacae complex Heavy growth    Organism ID, Bacteria Routine flora     Comment: Heavy growth   Antimicrobial Susceptibility Comment     Comment:       ** S = Susceptible; I = Intermediate; R = Resistant **                    P = Positive; N = Negative             MICS are expressed in micrograms per mL    Antibiotic                 RSLT#1    RSLT#2    RSLT#3    RSLT#4 Amoxicillin/Clavulanic Acid    R Cefazolin                      R Cefepime                       S Cefuroxime                     R Ciprofloxacin                  S Ertapenem                      S Gentamicin                     S Imipenem                       S Levofloxacin  S Meropenem                      S Tetracycline                   S Tobramycin                     S Trimethoprim/Sulfa             S     Objective: There were no vitals filed for this visit.  General: Patient is awake, alert, oriented in no acute distress and is very talkative and tends to make jokes and divert away from the topic of  concern  Dermatology: Skin is warm and dry bilateral with a partial thickness ulceration present  Right amputation stump. Ulceration is healed at the distal aspect now there is a wound at the proximal and distal aspect that measures pre-debridement 2 x 0.6 x 0.2 cm and post debridement measure 2 x 1 x 0.3 cm larger than previous, with a granular base macerated keratotic margins. The ulceration does not probe to bone.  There is no malodor, no active drainage, no erythema, trace edema to stump on right.  No other acute signs of infection.   Vascular: Dorsalis Pedis pulse = 1/4 Bilateral,  Posterior Tibial pulse = 0/4 Bilateral,  Capillary Fill Time < 5 seconds  Neurologic: Protective sensation absent bilateral.    Musculosketal: No pain with palpation to ulcerated area.  Status post right foot TMA.  No pain with compression to calves bilateral.  No results for input(s): GRAMSTAIN, LABORGA in the last 8760 hours.  Assessment and Plan:  Problem List Items Addressed This Visit      Cardiovascular and Mediastinum   PAD (peripheral artery disease) ([place])     Endocrine   Diabetic polyneuropathy associated with type 2 diabetes mellitus ([place])     Other   History of transmetatarsal amputation of right foot ([place])    Other Visit Diagnoses    Skin ulcer of right foot, limited to breakdown of skin ([place])    -  Primary     -Re-examined patient and discussed the progression of the wound and treatment alternatives. -Excisionally dedbrided ulceration at right amputation stump to healthy bleeding borders removing nonviable tissue using a sterile chisel blade. Wound measures post debridement as above. Wound was debrided to the level of the dermis with viable wound base exposed to promote healing. Hemostasis was achieved with manuel pressure. Patient tolerated procedure well without any discomfort or anesthesia necessary for this wound debridement. -Applied Prisma and dry sterile dressing and order home  nursing with [facility] to do the same twice weekly like before and may apply foot miracle cream to dry skin areas like we did on today's visit as well.  -Patient has been approved for [facility] we will order this for patient to have for application at next visit -Patient is awaiting custom insole like before once we get wound healed - Advised patient to go to the ER or return to office if the wound worsens or if constitutional symptoms are present. -Patient to return to office in 2 weeks for follow up wound care/PuraPly application.   Landis Martins, DPM

## 2019-09-01 DIAGNOSIS — E11621 Type 2 diabetes mellitus with foot ulcer: Secondary | ICD-10-CM | POA: Diagnosis not present

## 2019-09-01 DIAGNOSIS — E1151 Type 2 diabetes mellitus with diabetic peripheral angiopathy without gangrene: Secondary | ICD-10-CM

## 2019-09-01 DIAGNOSIS — L97419 Non-pressure chronic ulcer of right heel and midfoot with unspecified severity: Secondary | ICD-10-CM | POA: Diagnosis not present

## 2019-09-10 ENCOUNTER — Ambulatory Visit (INDEPENDENT_AMBULATORY_CARE_PROVIDER_SITE_OTHER): Payer: BC Managed Care – PPO | Admitting: Sports Medicine

## 2019-09-10 ENCOUNTER — Telehealth: Payer: Self-pay | Admitting: *Deleted

## 2019-09-10 ENCOUNTER — Other Ambulatory Visit: Payer: Self-pay

## 2019-09-10 ENCOUNTER — Encounter: Payer: Self-pay | Admitting: Sports Medicine

## 2019-09-10 DIAGNOSIS — Z89431 Acquired absence of right foot: Secondary | ICD-10-CM

## 2019-09-10 DIAGNOSIS — I739 Peripheral vascular disease, unspecified: Secondary | ICD-10-CM

## 2019-09-10 DIAGNOSIS — L97511 Non-pressure chronic ulcer of other part of right foot limited to breakdown of skin: Secondary | ICD-10-CM

## 2019-09-10 DIAGNOSIS — E1142 Type 2 diabetes mellitus with diabetic polyneuropathy: Secondary | ICD-10-CM

## 2019-09-10 NOTE — Telephone Encounter (Signed)
-----   Message from Landis Martins, [place] sent at 09/10/2019  1:34 PM EST ----- Regarding: Wound care orders: [facility] Change outer dressings only (apply 4x4, adb, kerlix, and ace), patient had a graft placed in the wound bed that's covered steristrips and adaptic twice weekly. -Dr. Cannon Kettle

## 2019-09-10 NOTE — Progress Notes (Signed)
Subjective: Adrian Bean is a 69 y.o. male patient seen in office for follow up evaluation of ulceration of the right foot amputation stump.  Patient reports that nurse is helping to change the dressing and that the drainage is the same. Patient denies nausea vomiting fever chills or any other constitutional symptoms at this time.  Fasting blood sugar does not check as previously noted last A1c 6 same as previous  Patient Active Problem List   Diagnosis Date Noted  . Infected surgical wound 06/18/2018  . Peripheral polyneuropathy 06/18/2018  . Mixed dyslipidemia 06/17/2018  . Essential hypertension 12/12/2017  . Need for hepatitis C screening test 12/12/2017  . Screening for colon cancer 12/12/2017  . Diabetic polyneuropathy associated with type 2 diabetes mellitus ([place]) 09/17/2017  . 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 History of transmetatarsal amputation of right foot ([place]) 07/04/2017  . PAD (peripheral artery disease) ([place]) 06/02/2017  . Sepsis ([place]) 06/02/2017  . DM (diabetes mellitus), type 2 ([place]) 06/02/2017  . Tobacco abuse 06/02/2017  . Tick bite 01/22/2017  . Pain in limb 10/21/2011  . Swelling of limb 10/21/2011   Current Outpatient Medications on File Prior to Visit  Medication Sig Dispense Refill  . albuterol (PROVENTIL) (2.5 MG/3ML) 0.083% nebulizer solution Take 3 mLs (2.5 mg total) by nebulization every 4 (four) hours as needed for wheezing or shortness of breath. 75 mL 12  . Amino Acids-Protein Hydrolys (FEEDING SUPPLEMENT, PRO-STAT SUGAR FREE 64,) LIQD Take 30 mLs by mouth 2 (two) times daily. 900 mL 0  . aspirin EC 81 MG EC tablet Take 1 tablet (81 mg total) by mouth daily.    Marland Kitchen atorvastatin (LIPITOR) 10 MG tablet Take 10 mg by mouth daily.    Marland Kitchen atorvastatin (LIPITOR) 10 MG tablet Take 10 mg by mouth daily at 6 PM.    . bisacodyl (DULCOLAX) 10 MG suppository Place 1 suppository (10 mg total) rectally  daily as needed for moderate constipation. 12 suppository 0  . ciprofloxacin (CIPRO) 500 MG tablet Take 1 tablet (500 mg total) by mouth 2 (two) times daily. 20 tablet 0  . clindamycin (CLEOCIN) 300 MG capsule     . clopidogrel (PLAVIX) 75 MG tablet Take 75 mg by mouth daily.    . collagenase (SANTYL) ointment Apply topically daily. 15 g 0  . feeding supplement, GLUCERNA SHAKE, (GLUCERNA SHAKE) LIQD Take 237 mLs by mouth 3 (three) times daily between meals.  0  . gabapentin (NEURONTIN) 300 MG capsule Take 300 mg by mouth 3 (three) times daily.    Marland Kitchen gabapentin (NEURONTIN) 300 MG capsule Take 600 mg by mouth 3 (three) times daily.    Marland Kitchen glimepiride (AMARYL) 2 MG tablet Take 2 mg by mouth daily with breakfast.    . glimepiride (AMARYL) 2 MG tablet Take 2 mg by mouth daily with breakfast.    . HEPARIN LOCK FLUSH IV Inject into the vein.    Marland Kitchen HYDROcodone-acetaminophen (NORCO) 10-325 MG tablet Take 1 tablet by mouth every 6 (six) hours as needed for moderate pain.    Marland Kitchen lisinopril (PRINIVIL,ZESTRIL) 10 MG tablet Take 10 mg by mouth daily.    Marland Kitchen lisinopril (PRINIVIL,ZESTRIL) 10 MG tablet Take 10 mg by mouth daily.    Marland Kitchen lisinopril-hydrochlorothiazide (ZESTORETIC) 20-25 MG tablet     . Multiple Vitamin (MULTIVITAMIN WITH MINERALS) TABS tablet Take 1 tablet by mouth daily.    . multivitamin-iron-minerals-folic acid (CENTRUM) chewable tablet Chew 1 tablet by mouth  daily.    . nicotine (NICODERM CQ - DOSED IN MG/24 HOURS) 14 mg/24hr patch Place 1 patch (14 mg total) onto the skin daily. 28 patch 0  . omega-3 acid ethyl esters (LOVAZA) 1 g capsule Take 1 g by mouth daily.    . Omega-3 Fatty Acids (FISH OIL PO) Take 1 tablet by mouth daily.    Marland Kitchen oxyCODONE-acetaminophen (PERCOCET) 10-325 MG tablet Take 1 tablet by mouth every 6 (six) hours as needed for pain. 40 tablet 0  . polyethylene glycol (MIRALAX / GLYCOLAX) packet Take 17 g by mouth daily as needed for mild constipation.    . terbinafine (LAMISIL) 250 MG  tablet Take 250 mg by mouth daily.     No current facility-administered medications on file prior to visit.   No Known Allergies  No results found for this or any previous visit (from the past 2160 hour(s)).  Objective: There were no vitals filed for this visit.  General: Patient is awake, alert, oriented in no acute distress and is very talkative and tends to make jokes and divert away from the topic of concern  Dermatology: Skin is warm and dry bilateral with a partial thickness ulceration present  Right amputation stump. Ulceration is healed at the distal aspect now there is a wound at the proximal and distal aspect that measures pre-debridement 2 x 1 x 0.2 cm and post debridement measure 2.4 x 1.9 x 0.3 cm larger than previous, with a granular base macerated keratotic margins. The ulceration does not probe to bone.  There is no malodor, no active drainage, no erythema, trace edema to stump on right.  No other acute signs of infection.    Vascular: Dorsalis Pedis pulse = 1/4 Bilateral,  Posterior Tibial pulse = 0/4 Bilateral,  Capillary Fill Time < 5 seconds  Neurologic: Protective sensation absent bilateral.    Musculosketal: No pain with palpation to ulcerated area.  Status post right foot TMA.  No pain with compression to calves bilateral.  No results for input(s): GRAMSTAIN, LABORGA in the last 8760 hours.  Assessment and Plan:  Problem List Items Addressed This Visit      Cardiovascular and Mediastinum   PAD (peripheral artery disease) ([place])     Endocrine   Diabetic polyneuropathy associated with type 2 diabetes mellitus ([place])     Other   History of transmetatarsal amputation of right foot ([place])    Other Visit Diagnoses    Skin ulcer of right foot, limited to breakdown of skin ([place])    -  Primary     -Re-examined patient and discussed the progression of the wound and treatment alternatives. -Excisionally dedbrided ulceration at right amputation stump to healthy  bleeding borders removing nonviable tissue using a sterile chisel blade. Wound measures post debridement as above. Wound was debrided to the level of the dermis with viable wound base exposed to promote healing. Hemostasis was achieved with manuel pressure. Patient tolerated procedure well without any discomfort or anesthesia necessary for this wound debridement. -Applied Puraply 2x4cm wound matrix secured with steristrips and dry sterile dressing and order home nursing with [facility] to change outer dressing layers only. -Patient is awaiting custom insole like before once we get wound healed - Advised patient to go to the ER or return to office if the wound worsens or if constitutional symptoms are present. -Patient to return to office in 1 week for follow up wound care/PuraPly application.   Landis Martins, DPM

## 2019-09-10 NOTE — Telephone Encounter (Signed)
Faxed orders to [facility]. 

## 2019-09-17 ENCOUNTER — Ambulatory Visit (INDEPENDENT_AMBULATORY_CARE_PROVIDER_SITE_OTHER): Payer: BC Managed Care – PPO | Admitting: Sports Medicine

## 2019-09-17 ENCOUNTER — Encounter: Payer: Self-pay | Admitting: Sports Medicine

## 2019-09-17 ENCOUNTER — Other Ambulatory Visit: Payer: Self-pay

## 2019-09-17 ENCOUNTER — Telehealth: Payer: Self-pay | Admitting: *Deleted

## 2019-09-17 DIAGNOSIS — I739 Peripheral vascular disease, unspecified: Secondary | ICD-10-CM

## 2019-09-17 DIAGNOSIS — E1142 Type 2 diabetes mellitus with diabetic polyneuropathy: Secondary | ICD-10-CM | POA: Diagnosis not present

## 2019-09-17 DIAGNOSIS — Z89431 Acquired absence of right foot: Secondary | ICD-10-CM | POA: Diagnosis not present

## 2019-09-17 DIAGNOSIS — L97511 Non-pressure chronic ulcer of other part of right foot limited to breakdown of skin: Secondary | ICD-10-CM

## 2019-09-17 NOTE — Progress Notes (Addendum)
Subjective: Adrian Bean is a 69 y.o. male patient seen in office for follow up evaluation of ulceration of the right foot amputation stump.  Patient had PuraPly applied last visit.  Patient reports that nurse is helping to change the outer dressings and thinks that things are doing about the same.  Patient denies nausea vomiting fever chills or any other constitutional symptoms at this time.  Fasting blood sugar does not check as previously noted last A1c 6 same as previous  Patient Active Problem List   Diagnosis Date Noted  . Infected surgical wound 06/18/2018  . Peripheral polyneuropathy 06/18/2018  . Mixed dyslipidemia 06/17/2018  . Essential hypertension 12/12/2017  . Need for hepatitis C screening test 12/12/2017  . Screening for colon cancer 12/12/2017  . Diabetic polyneuropathy associated with type 2 diabetes mellitus ([place]) 09/17/2017  . 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 History of transmetatarsal amputation of right foot ([place]) 07/04/2017  . PAD (peripheral artery disease) ([place]) 06/02/2017  . Sepsis ([place]) 06/02/2017  . DM (diabetes mellitus), type 2 ([place]) 06/02/2017  . Tobacco abuse 06/02/2017  . Tick bite 01/22/2017  . Pain in limb 10/21/2011  . Swelling of limb 10/21/2011   Current Outpatient Medications on File Prior to Visit  Medication Sig Dispense Refill  . albuterol (PROVENTIL) (2.5 MG/3ML) 0.083% nebulizer solution Take 3 mLs (2.5 mg total) by nebulization every 4 (four) hours as needed for wheezing or shortness of breath. 75 mL 12  . Amino Acids-Protein Hydrolys (FEEDING SUPPLEMENT, PRO-STAT SUGAR FREE 64,) LIQD Take 30 mLs by mouth 2 (two) times daily. 900 mL 0  . aspirin EC 81 MG EC tablet Take 1 tablet (81 mg total) by mouth daily.    Marland Kitchen atorvastatin (LIPITOR) 10 MG tablet Take 10 mg by mouth daily.    Marland Kitchen atorvastatin (LIPITOR) 10 MG tablet Take 10 mg by mouth daily at 6 PM.    . bisacodyl (DULCOLAX) 10  MG suppository Place 1 suppository (10 mg total) rectally daily as needed for moderate constipation. 12 suppository 0  . ciprofloxacin (CIPRO) 500 MG tablet Take 1 tablet (500 mg total) by mouth 2 (two) times daily. 20 tablet 0  . clindamycin (CLEOCIN) 300 MG capsule     . clopidogrel (PLAVIX) 75 MG tablet Take 75 mg by mouth daily.    . collagenase (SANTYL) ointment Apply topically daily. 15 g 0  . feeding supplement, GLUCERNA SHAKE, (GLUCERNA SHAKE) LIQD Take 237 mLs by mouth 3 (three) times daily between meals.  0  . gabapentin (NEURONTIN) 300 MG capsule Take 300 mg by mouth 3 (three) times daily.    Marland Kitchen gabapentin (NEURONTIN) 300 MG capsule Take 600 mg by mouth 3 (three) times daily.    Marland Kitchen glimepiride (AMARYL) 2 MG tablet Take 2 mg by mouth daily with breakfast.    . glimepiride (AMARYL) 2 MG tablet Take 2 mg by mouth daily with breakfast.    . HEPARIN LOCK FLUSH IV Inject into the vein.    Marland Kitchen HYDROcodone-acetaminophen (NORCO) 10-325 MG tablet Take 1 tablet by mouth every 6 (six) hours as needed for moderate pain.    Marland Kitchen lisinopril (PRINIVIL,ZESTRIL) 10 MG tablet Take 10 mg by mouth daily.    Marland Kitchen lisinopril (PRINIVIL,ZESTRIL) 10 MG tablet Take 10 mg by mouth daily.    Marland Kitchen lisinopril-hydrochlorothiazide (ZESTORETIC) 20-25 MG tablet     . Multiple Vitamin (MULTIVITAMIN WITH MINERALS) TABS tablet Take 1 tablet by mouth daily.    Marland Kitchen  multivitamin-iron-minerals-folic acid (CENTRUM) chewable tablet Chew 1 tablet by mouth daily.    . nicotine (NICODERM CQ - DOSED IN MG/24 HOURS) 14 mg/24hr patch Place 1 patch (14 mg total) onto the skin daily. 28 patch 0  . omega-3 acid ethyl esters (LOVAZA) 1 g capsule Take 1 g by mouth daily.    . Omega-3 Fatty Acids (FISH OIL PO) Take 1 tablet by mouth daily.    Marland Kitchen oxyCODONE-acetaminophen (PERCOCET) 10-325 MG tablet Take 1 tablet by mouth every 6 (six) hours as needed for pain. 40 tablet 0  . polyethylene glycol (MIRALAX / GLYCOLAX) packet Take 17 g by mouth daily as needed  for mild constipation.    . terbinafine (LAMISIL) 250 MG tablet Take 250 mg by mouth daily.     No current facility-administered medications on file prior to visit.   No Known Allergies  No results found for this or any previous visit (from the past 2160 hour(s)).  Objective: There were no vitals filed for this visit.  General: Patient is awake, alert, oriented in no acute distress and is very talkative and tends to make jokes and divert away from the topic of concern  Dermatology: Skin is warm and dry bilateral with a partial thickness ulceration present  Right amputation stump. Ulceration is healed at the distal aspect now there is a wound at the proximal and distal aspect that measures pre-debridement 2 x 1 x 0.2 cm and post debridement measure 1.5 x 1 x 0.3 cm larger than previous, with a granular base macerated keratotic margins. The ulceration does not probe to bone.  There is no malodor, no active drainage, no erythema, trace edema to stump on right.  No other acute signs of infection.   Vascular: Dorsalis Pedis pulse = 1/4 Bilateral,  Posterior Tibial pulse = 0/4 Bilateral,  Capillary Fill Time < 5 seconds  Neurologic: Protective sensation absent bilateral.    Musculosketal: No pain with palpation to ulcerated area.  Status post right foot TMA.  No pain with compression to calves bilateral.  No results for input(s): GRAMSTAIN, LABORGA in the last 8760 hours.  Assessment and Plan:  Problem List Items Addressed This Visit      Cardiovascular and Mediastinum   PAD (peripheral artery disease) ([place])     Endocrine   Diabetic polyneuropathy associated with type 2 diabetes mellitus ([place])     Other   History of transmetatarsal amputation of right foot ([place])    Other Visit Diagnoses    Skin ulcer of right foot, limited to breakdown of skin ([place])    -  Primary     -Re-examined patient and discussed the progression of the wound and treatment alternatives. -Excisionally dedbrided  ulceration at right amputation stump to healthy bleeding borders removing nonviable tissue using a sterile chisel blade. Wound measures post debridement as above. Wound was debrided to the level of the dermis with viable wound base exposed to promote healing. Hemostasis was achieved with manuel pressure. Patient tolerated procedure well without any discomfort or anesthesia necessary for this wound debridement. -Applied Puraply 3.76x3.76cm wound matrix lot [id number] expiration 02/18/2022 secured with steristrips and dry sterile dressing and order home nursing with [facility] to change outer dressing layers only. - Advised patient to go to the ER or return to office if the wound worsens or if constitutional symptoms are present. -Patient to return to office in 1 week for follow up wound care/PuraPly application.   Landis Martins, DPM

## 2019-09-17 NOTE — Telephone Encounter (Signed)
-----   Message from Landis Martins, [place] sent at 09/17/2019  2:56 PM EDT ----- Regarding: reorder Puraply Reorder for next visit with Sharyn Lull

## 2019-09-17 NOTE — Telephone Encounter (Signed)
Emailed order for [employer] for application in [place] by Dr. Cannon Kettle on 09/23/2019 at 11:30am.

## 2019-09-17 NOTE — Patient Instructions (Signed)
Applied Puraply 2x4cm wound matrix secured with steristrips and dry sterile dressing and order home nursing with [facility] to change outer dressing layers only.

## 2019-09-23 ENCOUNTER — Ambulatory Visit: Payer: Medicare Other | Admitting: Sports Medicine

## 2019-09-24 ENCOUNTER — Other Ambulatory Visit: Payer: Self-pay

## 2019-09-24 ENCOUNTER — Ambulatory Visit (INDEPENDENT_AMBULATORY_CARE_PROVIDER_SITE_OTHER): Payer: BC Managed Care – PPO | Admitting: Sports Medicine

## 2019-09-24 DIAGNOSIS — I739 Peripheral vascular disease, unspecified: Secondary | ICD-10-CM

## 2019-09-24 DIAGNOSIS — L97511 Non-pressure chronic ulcer of other part of right foot limited to breakdown of skin: Secondary | ICD-10-CM | POA: Diagnosis not present

## 2019-09-24 DIAGNOSIS — E1142 Type 2 diabetes mellitus with diabetic polyneuropathy: Secondary | ICD-10-CM

## 2019-09-24 DIAGNOSIS — Z89431 Acquired absence of right foot: Secondary | ICD-10-CM

## 2019-09-25 ENCOUNTER — Encounter: Payer: Self-pay | Admitting: Sports Medicine

## 2019-09-25 NOTE — Progress Notes (Signed)
Subjective: Adrian Bean is a 69 y.o. male patient seen in office for follow up evaluation of ulceration of the right foot amputation stump.  Patient had PuraPly application #2 applied last visit.  Patient reports that nurse is helping to change the outer dressings and thinks that things are pretty good however there is a little more drainage.  Patient denies redness warmth swelling nausea vomiting fever chills or any other constitutional symptoms at this time however does admit to some dry skin.  Fasting blood sugar does not check as previously noted last A1c 6 same as previous  Patient Active Problem List   Diagnosis Date Noted  . Infected surgical wound 06/18/2018  . Peripheral polyneuropathy 06/18/2018  . Mixed dyslipidemia 06/17/2018  . Essential hypertension 12/12/2017  . Need for hepatitis C screening test 12/12/2017  . Screening for colon cancer 12/12/2017  . Diabetic polyneuropathy associated with type 2 diabetes mellitus ([place]) 09/17/2017  . 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 History of transmetatarsal amputation of right foot ([place]) 07/04/2017  . PAD (peripheral artery disease) ([place]) 06/02/2017  . Sepsis ([place]) 06/02/2017  . DM (diabetes mellitus), type 2 ([place]) 06/02/2017  . Tobacco abuse 06/02/2017  . Tick bite 01/22/2017  . Pain in limb 10/21/2011  . Swelling of limb 10/21/2011   Current Outpatient Medications on File Prior to Visit  Medication Sig Dispense Refill  . albuterol (PROVENTIL) (2.5 MG/3ML) 0.083% nebulizer solution Take 3 mLs (2.5 mg total) by nebulization every 4 (four) hours as needed for wheezing or shortness of breath. 75 mL 12  . Amino Acids-Protein Hydrolys (FEEDING SUPPLEMENT, PRO-STAT SUGAR FREE 64,) LIQD Take 30 mLs by mouth 2 (two) times daily. 900 mL 0  . aspirin EC 81 MG EC tablet Take 1 tablet (81 mg total) by mouth daily.    Marland Kitchen atorvastatin (LIPITOR) 10 MG tablet Take 10 mg by mouth daily.     Marland Kitchen atorvastatin (LIPITOR) 10 MG tablet Take 10 mg by mouth daily at 6 PM.    . bisacodyl (DULCOLAX) 10 MG suppository Place 1 suppository (10 mg total) rectally daily as needed for moderate constipation. 12 suppository 0  . ciprofloxacin (CIPRO) 500 MG tablet Take 1 tablet (500 mg total) by mouth 2 (two) times daily. 20 tablet 0  . clindamycin (CLEOCIN) 300 MG capsule     . clopidogrel (PLAVIX) 75 MG tablet Take 75 mg by mouth daily.    . collagenase (SANTYL) ointment Apply topically daily. 15 g 0  . feeding supplement, GLUCERNA SHAKE, (GLUCERNA SHAKE) LIQD Take 237 mLs by mouth 3 (three) times daily between meals.  0  . gabapentin (NEURONTIN) 300 MG capsule Take 300 mg by mouth 3 (three) times daily.    Marland Kitchen gabapentin (NEURONTIN) 300 MG capsule Take 600 mg by mouth 3 (three) times daily.    Marland Kitchen glimepiride (AMARYL) 2 MG tablet Take 2 mg by mouth daily with breakfast.    . glimepiride (AMARYL) 2 MG tablet Take 2 mg by mouth daily with breakfast.    . HEPARIN LOCK FLUSH IV Inject into the vein.    Marland Kitchen HYDROcodone-acetaminophen (NORCO) 10-325 MG tablet Take 1 tablet by mouth every 6 (six) hours as needed for moderate pain.    Marland Kitchen lisinopril (PRINIVIL,ZESTRIL) 10 MG tablet Take 10 mg by mouth daily.    Marland Kitchen lisinopril (PRINIVIL,ZESTRIL) 10 MG tablet Take 10 mg by mouth daily.    Marland Kitchen lisinopril-hydrochlorothiazide (ZESTORETIC) 20-25 MG tablet     .  Multiple Vitamin (MULTIVITAMIN WITH MINERALS) TABS tablet Take 1 tablet by mouth daily.    . multivitamin-iron-minerals-folic acid (CENTRUM) chewable tablet Chew 1 tablet by mouth daily.    . nicotine (NICODERM CQ - DOSED IN MG/24 HOURS) 14 mg/24hr patch Place 1 patch (14 mg total) onto the skin daily. 28 patch 0  . omega-3 acid ethyl esters (LOVAZA) 1 g capsule Take 1 g by mouth daily.    . Omega-3 Fatty Acids (FISH OIL PO) Take 1 tablet by mouth daily.    Marland Kitchen oxyCODONE-acetaminophen (PERCOCET) 10-325 MG tablet Take 1 tablet by mouth every 6 (six) hours as needed for  pain. 40 tablet 0  . polyethylene glycol (MIRALAX / GLYCOLAX) packet Take 17 g by mouth daily as needed for mild constipation.    . terbinafine (LAMISIL) 250 MG tablet Take 250 mg by mouth daily.     No current facility-administered medications on file prior to visit.   No Known Allergies  No results found for this or any previous visit (from the past 2160 hour(s)).  Objective: There were no vitals filed for this visit.  General: Patient is awake, alert, oriented in no acute distress and is very talkative and tends to make jokes and divert away from the topic of concern  Dermatology: Skin is warm and dry bilateral with a partial thickness ulceration present  Right amputation stump. Ulceration is healed at the distal aspect now there is a wound at the proximal and distal aspect that measures pre-debridement 1.5x 1 x 0.2 cm and post debridement measure 1.7 x 0.9 x 0.2 cm, with a granular base macerated keratotic margins. The ulceration does not probe to bone.  There is no malodor, no active drainage, no erythema, trace edema to stump on right.  No other acute signs of infection.   Vascular: Dorsalis Pedis pulse = 1/4 Bilateral,  Posterior Tibial pulse = 0/4 Bilateral,  Capillary Fill Time < 5 seconds  Neurologic: Protective sensation absent bilateral.    Musculosketal: No pain with palpation to ulcerated area.  Status post right foot TMA.  No pain with compression to calves bilateral.  No results for input(s): GRAMSTAIN, LABORGA in the last 8760 hours.  Assessment and Plan:  Problem List Items Addressed This Visit      Cardiovascular and Mediastinum   PAD (peripheral artery disease) ([place])     Endocrine   Diabetic polyneuropathy associated with type 2 diabetes mellitus ([place])     Other   History of transmetatarsal amputation of right foot ([place])    Other Visit Diagnoses    Skin ulcer of right foot, limited to breakdown of skin ([place])    -  Primary     -Re-examined patient and  discussed the progression of the wound and treatment alternatives. -Excisionally dedbrided ulceration at right amputation stump to healthy bleeding borders removing nonviable tissue using a sterile chisel blade. Wound measures post debridement as above. Wound was debrided to the level of the dermis with viable wound base exposed to promote healing. Hemostasis was achieved with manuel pressure. Patient tolerated procedure well without any discomfort or anesthesia necessary for this wound debridement. -Applied Puraply 3.76x3.76cm wound matrix lot [id number] expiration 12/25/2021 secured with steristrips and dry sterile dressing.  This is application #3 to the area. Home nursing with [facility] to change outer dressing layers only since graft/wound matrix was applied at this visit. - Advised patient to go to the ER or return to office if the wound worsens or if  constitutional symptoms are present. -Patient to return to office in 1 week for follow up wound care/PuraPly application.   Landis Martins, DPM

## 2019-09-27 ENCOUNTER — Telehealth: Payer: Self-pay | Admitting: *Deleted

## 2019-09-27 NOTE — Telephone Encounter (Signed)
Emailed to [employer]. Fransisco Beau order for PuraPly for application [id number] at 3:30pm in [place] by Dr. Cannon Kettle.

## 2019-09-27 NOTE — Telephone Encounter (Signed)
-----   Message from Landis Martins, [place] sent at 09/25/2019  7:14 AM EDT ----- Regarding: Reorder Puraply Reorder for next visit, contact Sharyn Lull from [facility]

## 2019-09-29 ENCOUNTER — Ambulatory Visit: Payer: Medicare Other | Admitting: Sports Medicine

## 2019-09-29 ENCOUNTER — Encounter: Payer: Self-pay | Admitting: Sports Medicine

## 2019-09-29 ENCOUNTER — Ambulatory Visit (INDEPENDENT_AMBULATORY_CARE_PROVIDER_SITE_OTHER): Payer: BC Managed Care – PPO | Admitting: Sports Medicine

## 2019-09-29 ENCOUNTER — Other Ambulatory Visit: Payer: Self-pay

## 2019-09-29 DIAGNOSIS — I739 Peripheral vascular disease, unspecified: Secondary | ICD-10-CM | POA: Diagnosis not present

## 2019-09-29 DIAGNOSIS — E1142 Type 2 diabetes mellitus with diabetic polyneuropathy: Secondary | ICD-10-CM

## 2019-09-29 DIAGNOSIS — Z89431 Acquired absence of right foot: Secondary | ICD-10-CM | POA: Diagnosis not present

## 2019-09-29 DIAGNOSIS — L97511 Non-pressure chronic ulcer of other part of right foot limited to breakdown of skin: Secondary | ICD-10-CM | POA: Diagnosis not present

## 2019-09-29 NOTE — Progress Notes (Signed)
Subjective: Adrian Bean is a 69 y.o. male patient seen in office for follow up evaluation of ulceration of the right foot amputation stump.  Patient had PuraPly application #3 applied last visit.  Patient reports that nurse is helping to change the outer dressings and thinks he had a little more drainage but otherwise he is doing fine.  Patient denies redness warmth swelling nausea vomiting fever chills or any other constitutional symptoms at this time.  Fasting blood sugar does not check as previously noted last A1c 6 same as previous  Patient Active Problem List   Diagnosis Date Noted  . Infected surgical wound 06/18/2018  . Peripheral polyneuropathy 06/18/2018  . Mixed dyslipidemia 06/17/2018  . Essential hypertension 12/12/2017  . Need for hepatitis C screening test 12/12/2017  . Screening for colon cancer 12/12/2017  . Diabetic polyneuropathy associated with type 2 diabetes mellitus ([place]) 09/17/2017  . 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 History of transmetatarsal amputation of right foot ([place]) 07/04/2017  . PAD (peripheral artery disease) ([place]) 06/02/2017  . Sepsis ([place]) 06/02/2017  . DM (diabetes mellitus), type 2 ([place]) 06/02/2017  . Tobacco abuse 06/02/2017  . Tick bite 01/22/2017  . Pain in limb 10/21/2011  . Swelling of limb 10/21/2011   Current Outpatient Medications on File Prior to Visit  Medication Sig Dispense Refill  . albuterol (PROVENTIL) (2.5 MG/3ML) 0.083% nebulizer solution Take 3 mLs (2.5 mg total) by nebulization every 4 (four) hours as needed for wheezing or shortness of breath. 75 mL 12  . Amino Acids-Protein Hydrolys (FEEDING SUPPLEMENT, PRO-STAT SUGAR FREE 64,) LIQD Take 30 mLs by mouth 2 (two) times daily. 900 mL 0  . aspirin EC 81 MG EC tablet Take 1 tablet (81 mg total) by mouth daily.    Marland Kitchen atorvastatin (LIPITOR) 10 MG tablet Take 10 mg by mouth daily.    Marland Kitchen atorvastatin (LIPITOR) 10 MG tablet  Take 10 mg by mouth daily at 6 PM.    . bisacodyl (DULCOLAX) 10 MG suppository Place 1 suppository (10 mg total) rectally daily as needed for moderate constipation. 12 suppository 0  . ciprofloxacin (CIPRO) 500 MG tablet Take 1 tablet (500 mg total) by mouth 2 (two) times daily. 20 tablet 0  . clindamycin (CLEOCIN) 300 MG capsule     . clopidogrel (PLAVIX) 75 MG tablet Take 75 mg by mouth daily.    . collagenase (SANTYL) ointment Apply topically daily. 15 g 0  . feeding supplement, GLUCERNA SHAKE, (GLUCERNA SHAKE) LIQD Take 237 mLs by mouth 3 (three) times daily between meals.  0  . gabapentin (NEURONTIN) 300 MG capsule Take 300 mg by mouth 3 (three) times daily.    Marland Kitchen gabapentin (NEURONTIN) 300 MG capsule Take 600 mg by mouth 3 (three) times daily.    Marland Kitchen glimepiride (AMARYL) 2 MG tablet Take 2 mg by mouth daily with breakfast.    . glimepiride (AMARYL) 2 MG tablet Take 2 mg by mouth daily with breakfast.    . HEPARIN LOCK FLUSH IV Inject into the vein.    Marland Kitchen HYDROcodone-acetaminophen (NORCO) 10-325 MG tablet Take 1 tablet by mouth every 6 (six) hours as needed for moderate pain.    Marland Kitchen lisinopril (PRINIVIL,ZESTRIL) 10 MG tablet Take 10 mg by mouth daily.    Marland Kitchen lisinopril (PRINIVIL,ZESTRIL) 10 MG tablet Take 10 mg by mouth daily.    Marland Kitchen lisinopril-hydrochlorothiazide (ZESTORETIC) 20-25 MG tablet     . Multiple Vitamin (MULTIVITAMIN WITH MINERALS) TABS  tablet Take 1 tablet by mouth daily.    . multivitamin-iron-minerals-folic acid (CENTRUM) chewable tablet Chew 1 tablet by mouth daily.    . nicotine (NICODERM CQ - DOSED IN MG/24 HOURS) 14 mg/24hr patch Place 1 patch (14 mg total) onto the skin daily. 28 patch 0  . omega-3 acid ethyl esters (LOVAZA) 1 g capsule Take 1 g by mouth daily.    . Omega-3 Fatty Acids (FISH OIL PO) Take 1 tablet by mouth daily.    Marland Kitchen oxyCODONE-acetaminophen (PERCOCET) 10-325 MG tablet Take 1 tablet by mouth every 6 (six) hours as needed for pain. 40 tablet 0  . polyethylene  glycol (MIRALAX / GLYCOLAX) packet Take 17 g by mouth daily as needed for mild constipation.    . terbinafine (LAMISIL) 250 MG tablet Take 250 mg by mouth daily.     No current facility-administered medications on file prior to visit.   No Known Allergies  No results found for this or any previous visit (from the past 2160 hour(s)).  Objective: There were no vitals filed for this visit.  General: Patient is awake, alert, oriented in no acute distress  Dermatology: Skin is warm and dry bilateral with a partial thickness ulceration present at Right amputation stump. Ulceration is healed at the distal aspect now there is a wound at the proximal and distal aspect that measures post debridement measure 1.5x 0.5 x 0.3 cm, smaller than previous with a granular base macerated keratotic margins. The ulceration does not probe to bone.  There is no malodor, no active drainage, no erythema, trace edema to stump on right.  No other acute signs of infection.   Vascular: Dorsalis Pedis pulse = 1/4 Bilateral,  Posterior Tibial pulse = 0/4 Bilateral,  Capillary Fill Time < 5 seconds  Neurologic: Protective sensation absent bilateral.    Musculosketal: No pain with palpation to ulcerated area.  Status post right foot TMA.  No pain with compression to calves bilateral.  No results for input(s): GRAMSTAIN, LABORGA in the last 8760 hours.  Assessment and Plan:  Problem List Items Addressed This Visit      Cardiovascular and Mediastinum   PAD (peripheral artery disease) ([place])     Endocrine   Diabetic polyneuropathy associated with type 2 diabetes mellitus ([place])     Other   History of transmetatarsal amputation of right foot ([place])    Other Visit Diagnoses    Skin ulcer of right foot, limited to breakdown of skin ([place])    -  Primary     -Re-examined patient and discussed the progression of the wound and treatment alternatives. -Excisionally dedbrided ulceration at right amputation stump to healthy  bleeding borders removing nonviable tissue using a sterile chisel blade. Wound measures post debridement as above. Wound was debrided to the level of the dermis with viable wound base exposed to promote healing. Hemostasis was achieved with manuel pressure. Patient tolerated procedure well without any discomfort or anesthesia necessary for this wound debridement. -Applied Puraply 3.76x3.76cm wound matrix lot AM [id number] expiration 12/25/2021 secured with steristrips and dry sterile dressing.  This is application #4 to the area. Home nursing with [facility] to change outer dressing layers only since graft/wound matrix was applied at this visit. - Advised patient to go to the ER or return to office if the wound worsens or if constitutional symptoms are present. -Patient to return to office in 2 weeks for follow up wound care/PuraPly application.   Landis Martins, DPM

## 2019-09-30 ENCOUNTER — Telehealth: Payer: Self-pay | Admitting: *Deleted

## 2019-09-30 NOTE — Telephone Encounter (Signed)
-----   Message from Landis Martins, [place] sent at 09/29/2019  9:58 PM EDT ----- Regarding: Reorder PuraPly Reorder for next visit

## 2019-09-30 NOTE — Telephone Encounter (Signed)
Emailed order for PuraPly to be applied 10/15/2019 at 10:45am in [place] by Dr. Cannon Kettle.

## 2019-10-15 ENCOUNTER — Ambulatory Visit (INDEPENDENT_AMBULATORY_CARE_PROVIDER_SITE_OTHER): Payer: BC Managed Care – PPO | Admitting: Sports Medicine

## 2019-10-15 ENCOUNTER — Other Ambulatory Visit: Payer: Self-pay

## 2019-10-15 ENCOUNTER — Encounter: Payer: Self-pay | Admitting: Sports Medicine

## 2019-10-15 DIAGNOSIS — L97511 Non-pressure chronic ulcer of other part of right foot limited to breakdown of skin: Secondary | ICD-10-CM | POA: Diagnosis not present

## 2019-10-15 DIAGNOSIS — Z89431 Acquired absence of right foot: Secondary | ICD-10-CM

## 2019-10-15 DIAGNOSIS — I739 Peripheral vascular disease, unspecified: Secondary | ICD-10-CM

## 2019-10-15 DIAGNOSIS — E1142 Type 2 diabetes mellitus with diabetic polyneuropathy: Secondary | ICD-10-CM

## 2019-10-15 NOTE — Progress Notes (Signed)
Subjective: Adrian Bean is a 69 y.o. male patient seen in office for follow up evaluation of ulceration of the right foot amputation stump.  Patient had PuraPly application #4 applied last visit.  Patient reports that nurse is helping to change the outer dressings and thinks he is doing fine and nurse said it looks good.  Patient denies redness warmth swelling nausea vomiting fever chills or any other constitutional symptoms at this time.  Fasting blood sugar does not check as previously noted last A1c 6 same as previous  Patient Active Problem List   Diagnosis Date Noted  . Infected surgical wound 06/18/2018  . Peripheral polyneuropathy 06/18/2018  . Mixed dyslipidemia 06/17/2018  . Essential hypertension 12/12/2017  . Need for hepatitis C screening test 12/12/2017  . Screening for colon cancer 12/12/2017  . Diabetic polyneuropathy associated with type 2 diabetes mellitus ([place]) 09/17/2017  . 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 History of transmetatarsal amputation of right foot ([place]) 07/04/2017  . PAD (peripheral artery disease) ([place]) 06/02/2017  . Sepsis ([place]) 06/02/2017  . DM (diabetes mellitus), type 2 ([place]) 06/02/2017  . Tobacco abuse 06/02/2017  . Tick bite 01/22/2017  . Pain in limb 10/21/2011  . Swelling of limb 10/21/2011   Current Outpatient Medications on File Prior to Visit  Medication Sig Dispense Refill  . albuterol (PROVENTIL) (2.5 MG/3ML) 0.083% nebulizer solution Take 3 mLs (2.5 mg total) by nebulization every 4 (four) hours as needed for wheezing or shortness of breath. 75 mL 12  . Amino Acids-Protein Hydrolys (FEEDING SUPPLEMENT, PRO-STAT SUGAR FREE 64,) LIQD Take 30 mLs by mouth 2 (two) times daily. 900 mL 0  . aspirin EC 81 MG EC tablet Take 1 tablet (81 mg total) by mouth daily.    Marland Kitchen atorvastatin (LIPITOR) 10 MG tablet Take 10 mg by mouth daily.    Marland Kitchen atorvastatin (LIPITOR) 10 MG tablet Take 10 mg by  mouth daily at 6 PM.    . bisacodyl (DULCOLAX) 10 MG suppository Place 1 suppository (10 mg total) rectally daily as needed for moderate constipation. 12 suppository 0  . ciprofloxacin (CIPRO) 500 MG tablet Take 1 tablet (500 mg total) by mouth 2 (two) times daily. 20 tablet 0  . clindamycin (CLEOCIN) 300 MG capsule     . clopidogrel (PLAVIX) 75 MG tablet Take 75 mg by mouth daily.    . collagenase (SANTYL) ointment Apply topically daily. 15 g 0  . feeding supplement, GLUCERNA SHAKE, (GLUCERNA SHAKE) LIQD Take 237 mLs by mouth 3 (three) times daily between meals.  0  . gabapentin (NEURONTIN) 300 MG capsule Take 300 mg by mouth 3 (three) times daily.    Marland Kitchen gabapentin (NEURONTIN) 300 MG capsule Take 600 mg by mouth 3 (three) times daily.    Marland Kitchen glimepiride (AMARYL) 2 MG tablet Take 2 mg by mouth daily with breakfast.    . glimepiride (AMARYL) 2 MG tablet Take 2 mg by mouth daily with breakfast.    . HEPARIN LOCK FLUSH IV Inject into the vein.    Marland Kitchen HYDROcodone-acetaminophen (NORCO) 10-325 MG tablet Take 1 tablet by mouth every 6 (six) hours as needed for moderate pain.    Marland Kitchen lisinopril (PRINIVIL,ZESTRIL) 10 MG tablet Take 10 mg by mouth daily.    Marland Kitchen lisinopril (PRINIVIL,ZESTRIL) 10 MG tablet Take 10 mg by mouth daily.    Marland Kitchen lisinopril-hydrochlorothiazide (ZESTORETIC) 20-25 MG tablet     . Multiple Vitamin (MULTIVITAMIN WITH MINERALS) TABS tablet Take  1 tablet by mouth daily.    . multivitamin-iron-minerals-folic acid (CENTRUM) chewable tablet Chew 1 tablet by mouth daily.    . nicotine (NICODERM CQ - DOSED IN MG/24 HOURS) 14 mg/24hr patch Place 1 patch (14 mg total) onto the skin daily. 28 patch 0  . omega-3 acid ethyl esters (LOVAZA) 1 g capsule Take 1 g by mouth daily.    . Omega-3 Fatty Acids (FISH OIL PO) Take 1 tablet by mouth daily.    Marland Kitchen oxyCODONE-acetaminophen (PERCOCET) 10-325 MG tablet Take 1 tablet by mouth every 6 (six) hours as needed for pain. 40 tablet 0  . polyethylene glycol (MIRALAX /  GLYCOLAX) packet Take 17 g by mouth daily as needed for mild constipation.    . terbinafine (LAMISIL) 250 MG tablet Take 250 mg by mouth daily.     No current facility-administered medications on file prior to visit.   No Known Allergies  No results found for this or any previous visit (from the past 2160 hour(s)).  Objective: There were no vitals filed for this visit.  General: Patient is awake, alert, oriented in no acute distress  Dermatology: Skin is warm and dry bilateral with a partial thickness ulceration present at Right amputation stump. Ulceration is healed at the distal aspect now there is a wound at the proximal and distal aspect that measures post debridement measure 1.4x 0.3 x 0.3 cm, smaller than previous with a granular base macerated keratotic margins. The ulceration does not probe to bone.  There is no malodor, no active drainage, no erythema, trace edema to stump on right.  No other acute signs of infection.    Vascular: Dorsalis Pedis pulse = 1/4 Bilateral,  Posterior Tibial pulse = 0/4 Bilateral,  Capillary Fill Time < 5 seconds  Neurologic: Protective sensation absent bilateral.    Musculosketal: No pain with palpation to ulcerated area.  Status post right foot TMA.  No pain with compression to calves bilateral.  No results for input(s): GRAMSTAIN, LABORGA in the last 8760 hours.  Assessment and Plan:  Problem List Items Addressed This Visit      Cardiovascular and Mediastinum   PAD (peripheral artery disease) ([place])     Endocrine   Diabetic polyneuropathy associated with type 2 diabetes mellitus ([place])     Other   History of transmetatarsal amputation of right foot ([place])    Other Visit Diagnoses    Skin ulcer of right foot, limited to breakdown of skin ([place])    -  Primary     -Re-examined patient and discussed the progression of the wound and treatment alternatives. -Excisionally dedbrided ulceration at right amputation stump to healthy bleeding borders  removing nonviable tissue using a sterile chisel blade. Wound measures post debridement as above. Wound was debrided to the level of the dermis with viable wound base exposed to promote healing. Hemostasis was achieved with manuel pressure. Patient tolerated procedure well without any discomfort or anesthesia necessary for this wound debridement. -Applied Puraply 3.02x3.02cm wound matrix lot [id number] expiration 02/18/2022 secured with steristrips and dry sterile dressing.  This is application #5 to the area. Home nursing with [facility] to change outer dressing layers only since graft/wound matrix was applied at this visit. - Advised patient to go to the ER or return to office if the wound worsens or if constitutional symptoms are present. -Patient to return to office in 2 weeks for follow up wound care/PuraPly application.   Landis Martins, DPM

## 2019-10-22 ENCOUNTER — Encounter: Payer: Self-pay | Admitting: Sports Medicine

## 2019-10-22 ENCOUNTER — Other Ambulatory Visit: Payer: Self-pay

## 2019-10-22 ENCOUNTER — Ambulatory Visit (INDEPENDENT_AMBULATORY_CARE_PROVIDER_SITE_OTHER): Payer: BC Managed Care – PPO | Admitting: Sports Medicine

## 2019-10-22 DIAGNOSIS — I739 Peripheral vascular disease, unspecified: Secondary | ICD-10-CM

## 2019-10-22 DIAGNOSIS — L97511 Non-pressure chronic ulcer of other part of right foot limited to breakdown of skin: Secondary | ICD-10-CM | POA: Diagnosis not present

## 2019-10-22 DIAGNOSIS — Z89431 Acquired absence of right foot: Secondary | ICD-10-CM

## 2019-10-22 DIAGNOSIS — E1142 Type 2 diabetes mellitus with diabetic polyneuropathy: Secondary | ICD-10-CM

## 2019-10-22 NOTE — Progress Notes (Signed)
Subjective: Adrian Bean is a 69 y.o. male patient seen in office for follow up evaluation of ulceration of the right foot amputation stump.  Patient had PuraPly application # 5 applied last visit.  Patient reports that nurse is helping to change the outer dressings and reports that his home nurse thinks that it is looking good with sometimes a little more drainage going through the bandage.  Patient denies redness warmth swelling nausea vomiting fever chills or any other constitutional symptoms at this time.  Fasting blood sugar does not check as previously noted last A1c 6 same as previously documented.  Patient Active Problem List   Diagnosis Date Noted  . Infected surgical wound 06/18/2018  . Peripheral polyneuropathy 06/18/2018  . Mixed dyslipidemia 06/17/2018  . Essential hypertension 12/12/2017  . Need for hepatitis C screening test 12/12/2017  . Screening for colon cancer 12/12/2017  . Diabetic polyneuropathy associated with type 2 diabetes mellitus ([place]) 09/17/2017  . 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 History of transmetatarsal amputation of right foot ([place]) 07/04/2017  . PAD (peripheral artery disease) ([place]) 06/02/2017  . Sepsis ([place]) 06/02/2017  . DM (diabetes mellitus), type 2 ([place]) 06/02/2017  . Tobacco abuse 06/02/2017  . Tick bite 01/22/2017  . Pain in limb 10/21/2011  . Swelling of limb 10/21/2011   Current Outpatient Medications on File Prior to Visit  Medication Sig Dispense Refill  . albuterol (PROVENTIL) (2.5 MG/3ML) 0.083% nebulizer solution Take 3 mLs (2.5 mg total) by nebulization every 4 (four) hours as needed for wheezing or shortness of breath. 75 mL 12  . Amino Acids-Protein Hydrolys (FEEDING SUPPLEMENT, PRO-STAT SUGAR FREE 64,) LIQD Take 30 mLs by mouth 2 (two) times daily. 900 mL 0  . aspirin EC 81 MG EC tablet Take 1 tablet (81 mg total) by mouth daily.    Marland Kitchen atorvastatin (LIPITOR) 10 MG tablet  Take 10 mg by mouth daily.    Marland Kitchen atorvastatin (LIPITOR) 10 MG tablet Take 10 mg by mouth daily at 6 PM.    . bisacodyl (DULCOLAX) 10 MG suppository Place 1 suppository (10 mg total) rectally daily as needed for moderate constipation. 12 suppository 0  . ciprofloxacin (CIPRO) 500 MG tablet Take 1 tablet (500 mg total) by mouth 2 (two) times daily. 20 tablet 0  . clindamycin (CLEOCIN) 300 MG capsule     . clopidogrel (PLAVIX) 75 MG tablet Take 75 mg by mouth daily.    . collagenase (SANTYL) ointment Apply topically daily. 15 g 0  . feeding supplement, GLUCERNA SHAKE, (GLUCERNA SHAKE) LIQD Take 237 mLs by mouth 3 (three) times daily between meals.  0  . gabapentin (NEURONTIN) 300 MG capsule Take 300 mg by mouth 3 (three) times daily.    Marland Kitchen gabapentin (NEURONTIN) 300 MG capsule Take 600 mg by mouth 3 (three) times daily.    Marland Kitchen glimepiride (AMARYL) 2 MG tablet Take 2 mg by mouth daily with breakfast.    . glimepiride (AMARYL) 2 MG tablet Take 2 mg by mouth daily with breakfast.    . HEPARIN LOCK FLUSH IV Inject into the vein.    Marland Kitchen HYDROcodone-acetaminophen (NORCO) 10-325 MG tablet Take 1 tablet by mouth every 6 (six) hours as needed for moderate pain.    Marland Kitchen lisinopril (PRINIVIL,ZESTRIL) 10 MG tablet Take 10 mg by mouth daily.    Marland Kitchen lisinopril (PRINIVIL,ZESTRIL) 10 MG tablet Take 10 mg by mouth daily.    Marland Kitchen lisinopril-hydrochlorothiazide (ZESTORETIC) 20-25 MG tablet     .  Multiple Vitamin (MULTIVITAMIN WITH MINERALS) TABS tablet Take 1 tablet by mouth daily.    . multivitamin-iron-minerals-folic acid (CENTRUM) chewable tablet Chew 1 tablet by mouth daily.    . nicotine (NICODERM CQ - DOSED IN MG/24 HOURS) 14 mg/24hr patch Place 1 patch (14 mg total) onto the skin daily. 28 patch 0  . omega-3 acid ethyl esters (LOVAZA) 1 g capsule Take 1 g by mouth daily.    . Omega-3 Fatty Acids (FISH OIL PO) Take 1 tablet by mouth daily.    Marland Kitchen oxyCODONE-acetaminophen (PERCOCET) 10-325 MG tablet Take 1 tablet by mouth every  6 (six) hours as needed for pain. 40 tablet 0  . polyethylene glycol (MIRALAX / GLYCOLAX) packet Take 17 g by mouth daily as needed for mild constipation.    . terbinafine (LAMISIL) 250 MG tablet Take 250 mg by mouth daily.     No current facility-administered medications on file prior to visit.   No Known Allergies  No results found for this or any previous visit (from the past 2160 hour(s)).  Objective: There were no vitals filed for this visit.  General: Patient is awake, alert, oriented in no acute distress  Dermatology: Skin is warm and dry bilateral with a partial thickness ulceration present at Right amputation stump. Ulceration is healed at the distal aspect now there is a wound at the proximal and distal aspect that measures post debridement measure 1.5x 0.7 x 0.3 cm with a granular base with keratotic margins. The ulceration does not probe to bone.  There is no malodor, no active drainage, no erythema, trace edema to stump on right.  No other acute signs of infection.  Vascular: Dorsalis Pedis pulse = 1/4 Bilateral,  Posterior Tibial pulse = 0/4 Bilateral,  Capillary Fill Time < 5 seconds  Neurologic: Protective sensation absent bilateral.    Musculosketal: No pain with palpation to ulcerated area.  Status post right foot TMA.  No pain with compression to calves bilateral.  No results for input(s): GRAMSTAIN, LABORGA in the last 8760 hours.  Assessment and Plan:  Problem List Items Addressed This Visit      Cardiovascular and Mediastinum   PAD (peripheral artery disease) ([place])     Endocrine   Diabetic polyneuropathy associated with type 2 diabetes mellitus ([place])     Other   History of transmetatarsal amputation of right foot ([place])    Other Visit Diagnoses    Skin ulcer of right foot, limited to breakdown of skin ([place])    -  Primary     -Re-examined patient and discussed the progression of the wound and treatment alternatives. -Excisionally dedbrided ulceration at  right amputation stump to healthy bleeding borders removing nonviable tissue using a sterile chisel blade. Wound measures post debridement as above. Wound was debrided to the level of the dermis with viable wound base exposed to promote healing. Hemostasis was achieved with manuel pressure. Patient tolerated procedure well without any discomfort or anesthesia necessary for this wound debridement. -Applied NuShield 3.2 x 3.2 cm, donor [id number], ID number [id number] expiration 06/25/2024  secured with steristrips and dry sterile dressing.  This is application #5 to the area. Home nursing with [facility] to change outer dressing layers only since graft/wound matrix was applied at this visit. - Advised patient to go to the ER or return to office if the wound worsens or if constitutional symptoms are present. -Patient to return to office in 1 week for follow up wound care/NuShield application.  Landis Martins, DPM

## 2019-10-29 ENCOUNTER — Other Ambulatory Visit: Payer: Self-pay

## 2019-10-29 ENCOUNTER — Ambulatory Visit (INDEPENDENT_AMBULATORY_CARE_PROVIDER_SITE_OTHER): Payer: BC Managed Care – PPO | Admitting: Sports Medicine

## 2019-10-29 ENCOUNTER — Encounter: Payer: Self-pay | Admitting: Sports Medicine

## 2019-10-29 DIAGNOSIS — L97511 Non-pressure chronic ulcer of other part of right foot limited to breakdown of skin: Secondary | ICD-10-CM

## 2019-10-29 DIAGNOSIS — Z89431 Acquired absence of right foot: Secondary | ICD-10-CM | POA: Diagnosis not present

## 2019-10-29 DIAGNOSIS — E1142 Type 2 diabetes mellitus with diabetic polyneuropathy: Secondary | ICD-10-CM

## 2019-10-29 DIAGNOSIS — I739 Peripheral vascular disease, unspecified: Secondary | ICD-10-CM | POA: Diagnosis not present

## 2019-10-29 NOTE — Progress Notes (Signed)
Subjective: Adrian Bean is a 69 y.o. male patient seen in office for follow up evaluation of ulceration of the right foot amputation stump.  Patient had Nushield application # 1 applied last visit.  Patient reports that nurse is helping to change the outer dressings and reports that his home nurse thinks that it is looking good.  Patient denies redness warmth swelling nausea vomiting fever chills or any other constitutional symptoms at this time.  Fasting blood sugar does not check as previously noted last A1c 6 same as per patient report.  Patient Active Problem List   Diagnosis Date Noted  . Infected surgical wound 06/18/2018  . Peripheral polyneuropathy 06/18/2018  . Mixed dyslipidemia 06/17/2018  . Essential hypertension 12/12/2017  . Need for hepatitis C screening test 12/12/2017  . Screening for colon cancer 12/12/2017  . Diabetic polyneuropathy associated with type 2 diabetes mellitus ([place]) 09/17/2017  . 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 History of transmetatarsal amputation of right foot ([place]) 07/04/2017  . PAD (peripheral artery disease) ([place]) 06/02/2017  . Sepsis ([place]) 06/02/2017  . DM (diabetes mellitus), type 2 ([place]) 06/02/2017  . Tobacco abuse 06/02/2017  . Tick bite 01/22/2017  . Pain in limb 10/21/2011  . Swelling of limb 10/21/2011   Current Outpatient Medications on File Prior to Visit  Medication Sig Dispense Refill  . albuterol (PROVENTIL) (2.5 MG/3ML) 0.083% nebulizer solution Take 3 mLs (2.5 mg total) by nebulization every 4 (four) hours as needed for wheezing or shortness of breath. 75 mL 12  . Amino Acids-Protein Hydrolys (FEEDING SUPPLEMENT, PRO-STAT SUGAR FREE 64,) LIQD Take 30 mLs by mouth 2 (two) times daily. 900 mL 0  . aspirin EC 81 MG EC tablet Take 1 tablet (81 mg total) by mouth daily.    Marland Kitchen atorvastatin (LIPITOR) 10 MG tablet Take 10 mg by mouth daily.    Marland Kitchen atorvastatin (LIPITOR) 10 MG  tablet Take 10 mg by mouth daily at 6 PM.    . bisacodyl (DULCOLAX) 10 MG suppository Place 1 suppository (10 mg total) rectally daily as needed for moderate constipation. 12 suppository 0  . ciprofloxacin (CIPRO) 500 MG tablet Take 1 tablet (500 mg total) by mouth 2 (two) times daily. 20 tablet 0  . clindamycin (CLEOCIN) 300 MG capsule     . clopidogrel (PLAVIX) 75 MG tablet Take 75 mg by mouth daily.    . collagenase (SANTYL) ointment Apply topically daily. 15 g 0  . feeding supplement, GLUCERNA SHAKE, (GLUCERNA SHAKE) LIQD Take 237 mLs by mouth 3 (three) times daily between meals.  0  . gabapentin (NEURONTIN) 300 MG capsule Take 300 mg by mouth 3 (three) times daily.    Marland Kitchen gabapentin (NEURONTIN) 300 MG capsule Take 600 mg by mouth 3 (three) times daily.    Marland Kitchen glimepiride (AMARYL) 2 MG tablet Take 2 mg by mouth daily with breakfast.    . glimepiride (AMARYL) 2 MG tablet Take 2 mg by mouth daily with breakfast.    . HEPARIN LOCK FLUSH IV Inject into the vein.    Marland Kitchen HYDROcodone-acetaminophen (NORCO) 10-325 MG tablet Take 1 tablet by mouth every 6 (six) hours as needed for moderate pain.    Marland Kitchen lisinopril (PRINIVIL,ZESTRIL) 10 MG tablet Take 10 mg by mouth daily.    Marland Kitchen lisinopril (PRINIVIL,ZESTRIL) 10 MG tablet Take 10 mg by mouth daily.    Marland Kitchen lisinopril-hydrochlorothiazide (ZESTORETIC) 20-25 MG tablet     . Multiple Vitamin (MULTIVITAMIN WITH MINERALS)  TABS tablet Take 1 tablet by mouth daily.    . multivitamin-iron-minerals-folic acid (CENTRUM) chewable tablet Chew 1 tablet by mouth daily.    . nicotine (NICODERM CQ - DOSED IN MG/24 HOURS) 14 mg/24hr patch Place 1 patch (14 mg total) onto the skin daily. 28 patch 0  . omega-3 acid ethyl esters (LOVAZA) 1 g capsule Take 1 g by mouth daily.    . Omega-3 Fatty Acids (FISH OIL PO) Take 1 tablet by mouth daily.    Marland Kitchen oxyCODONE-acetaminophen (PERCOCET) 10-325 MG tablet Take 1 tablet by mouth every 6 (six) hours as needed for pain. 40 tablet 0  .  polyethylene glycol (MIRALAX / GLYCOLAX) packet Take 17 g by mouth daily as needed for mild constipation.    . terbinafine (LAMISIL) 250 MG tablet Take 250 mg by mouth daily.     No current facility-administered medications on file prior to visit.   No Known Allergies  No results found for this or any previous visit (from the past 2160 hour(s)).  Objective: There were no vitals filed for this visit.  General: Patient is awake, alert, oriented in no acute distress  Dermatology: Skin is warm and dry bilateral with a partial thickness ulceration present at Right amputation stump. Ulceration is healed at the distal aspect now there is a wound at the proximal and distal aspect that measures post debridement measure 1.0x 0.4 x 0.3 cm with a granular base with keratotic margins. The ulceration does not probe to bone.  There is no malodor, no active drainage, no erythema, trace edema to stump on right.  No other acute signs of infection.    Vascular: Dorsalis Pedis pulse = 1/4 Bilateral,  Posterior Tibial pulse = 0/4 Bilateral,  Capillary Fill Time < 5 seconds  Neurologic: Protective sensation absent bilateral.    Musculosketal: No pain with palpation to ulcerated area.  Status post right foot TMA.  No pain with compression to calves bilateral.  No results for input(s): GRAMSTAIN, LABORGA in the last 8760 hours.  Assessment and Plan:  Problem List Items Addressed This Visit      Cardiovascular and Mediastinum   PAD (peripheral artery disease) ([place])     Endocrine   Diabetic polyneuropathy associated with type 2 diabetes mellitus ([place])     Other   History of transmetatarsal amputation of right foot ([place])    Other Visit Diagnoses    Skin ulcer of right foot, limited to breakdown of skin ([place])    -  Primary     -Re-examined patient and discussed the progression of the wound and treatment alternatives. -Excisionally dedbrided ulceration at right amputation stump to healthy bleeding  borders removing nonviable tissue using a sterile chisel blade. Wound measures post debridement as above. Wound was debrided to the level of the dermis with viable wound base exposed to promote healing. Hemostasis was achieved with manuel pressure. Patient tolerated procedure well without any discomfort or anesthesia necessary for this wound debridement. -Applied NuShield 3.2 x 3.2 cm, donor [id number], ID number [id number] expiration 06/17/2024  secured with steristrips and dry sterile dressing.  This is application #6 to the area but only the 2nd time that NuShield has been used. Home nursing with [facility] to change outer dressing layers only since graft/wound matrix was applied at this visit. - Advised patient to go to the ER or return to office if the wound worsens or if constitutional symptoms are present. -Patient to return to office in 1 week for follow  up wound care/NuShield application.   Landis Martins, DPM

## 2019-11-02 ENCOUNTER — Telehealth: Payer: Self-pay

## 2019-11-02 NOTE — Telephone Encounter (Signed)
Ivin Booty RN from [facility] called stating they have only been changing outer dressing on pt. Ivin Booty states that is not a skilled nursing for home health to come out to pt's house. Sharone states they can not continue to do outer dressing on the pt, that they will try to find a helper, CNA, or a neighbor to help the pt out.

## 2019-11-02 NOTE — Telephone Encounter (Signed)
Ok thanks 

## 2019-11-04 ENCOUNTER — Other Ambulatory Visit: Payer: Self-pay

## 2019-11-04 ENCOUNTER — Telehealth: Payer: Self-pay | Admitting: *Deleted

## 2019-11-04 ENCOUNTER — Ambulatory Visit (INDEPENDENT_AMBULATORY_CARE_PROVIDER_SITE_OTHER): Payer: BC Managed Care – PPO | Admitting: Sports Medicine

## 2019-11-04 ENCOUNTER — Encounter: Payer: Self-pay | Admitting: Sports Medicine

## 2019-11-04 DIAGNOSIS — L97511 Non-pressure chronic ulcer of other part of right foot limited to breakdown of skin: Secondary | ICD-10-CM

## 2019-11-04 DIAGNOSIS — E1142 Type 2 diabetes mellitus with diabetic polyneuropathy: Secondary | ICD-10-CM | POA: Diagnosis not present

## 2019-11-04 DIAGNOSIS — Z89431 Acquired absence of right foot: Secondary | ICD-10-CM

## 2019-11-04 NOTE — Telephone Encounter (Signed)
-----   Message from Landis Martins, [place] sent at 11/03/2019 11:10 PM EDT ----- Regarding: Reorder Next week NuShield 3.2x3.2 ([facility])

## 2019-11-04 NOTE — Telephone Encounter (Signed)
Ordered NuShield - [employer] - M. Brock for application [id number] at 10:45am in [place] by Dr. Cannon Kettle.

## 2019-11-04 NOTE — Progress Notes (Addendum)
Subjective: Adrian Bean is a 69 y.o. male patient seen in office for follow up evaluation of ulceration of the right foot amputation stump.  Patient had Nushield application # 2 applied last visit.  Patient reports that nurse is helping Bean change the outer dressings and reports that his home nurse will stop this week. Patient denies redness warmth swelling nausea vomiting fever chills or any other constitutional symptoms at this time.  Fasting blood sugar does not check as previously noted last A1c 6 per patient report.  Patient reports that he has [place].  Patient Active Problem List   Diagnosis Date Noted  . Infected surgical wound 06/18/2018  . Peripheral polyneuropathy 06/18/2018  . Mixed dyslipidemia 06/17/2018  . Essential hypertension 12/12/2017  . Need for hepatitis C screening test 12/12/2017  . Screening for colon cancer 12/12/2017  . Diabetic polyneuropathy associated with type 2 diabetes mellitus ([place]) 09/17/2017  . 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 History of transmetatarsal amputation of right foot ([place]) 07/04/2017  . PAD (peripheral artery disease) ([place]) 06/02/2017  . Sepsis ([place]) 06/02/2017  . DM (diabetes mellitus), type 2 ([place]) 06/02/2017  . Tobacco abuse 06/02/2017  . Tick bite 01/22/2017  . Pain in limb 10/21/2011  . Swelling of limb 10/21/2011   Current Outpatient Medications on File Prior Bean Visit  Medication Sig Dispense Refill  . albuterol (PROVENTIL) (2.5 MG/3ML) 0.083% nebulizer solution Take 3 mLs (2.5 mg total) by nebulization every 4 (four) hours as needed for wheezing or shortness of breath. 75 mL 12  . Amino Acids-Protein Hydrolys (FEEDING SUPPLEMENT, PRO-STAT SUGAR FREE 64,) LIQD Take 30 mLs by mouth 2 (two) times daily. 900 mL 0  . aspirin EC 81 MG EC tablet Take 1 tablet (81 mg total) by mouth daily.    Marland Kitchen atorvastatin (LIPITOR) 10 MG tablet Take 10 mg by mouth daily.    Marland Kitchen  atorvastatin (LIPITOR) 10 MG tablet Take 10 mg by mouth daily at 6 PM.    . bisacodyl (DULCOLAX) 10 MG suppository Place 1 suppository (10 mg total) rectally daily as needed for moderate constipation. 12 suppository 0  . ciprofloxacin (CIPRO) 500 MG tablet Take 1 tablet (500 mg total) by mouth 2 (two) times daily. 20 tablet 0  . clindamycin (CLEOCIN) 300 MG capsule     . clopidogrel (PLAVIX) 75 MG tablet Take 75 mg by mouth daily.    . collagenase (SANTYL) ointment Apply topically daily. 15 g 0  . feeding supplement, GLUCERNA SHAKE, (GLUCERNA SHAKE) LIQD Take 237 mLs by mouth 3 (three) times daily between meals.  0  . gabapentin (NEURONTIN) 300 MG capsule Take 300 mg by mouth 3 (three) times daily.    Marland Kitchen gabapentin (NEURONTIN) 300 MG capsule Take 600 mg by mouth 3 (three) times daily.    Marland Kitchen glimepiride (AMARYL) 2 MG tablet Take 2 mg by mouth daily with breakfast.    . glimepiride (AMARYL) 2 MG tablet Take 2 mg by mouth daily with breakfast.    . HEPARIN LOCK FLUSH IV Inject into the vein.    Marland Kitchen HYDROcodone-acetaminophen (NORCO) 10-325 MG tablet Take 1 tablet by mouth every 6 (six) hours as needed for moderate pain.    Marland Kitchen lisinopril (PRINIVIL,ZESTRIL) 10 MG tablet Take 10 mg by mouth daily.    Marland Kitchen lisinopril (PRINIVIL,ZESTRIL) 10 MG tablet Take 10 mg by mouth daily.    Marland Kitchen lisinopril-hydrochlorothiazide (ZESTORETIC) 20-25 MG tablet     . Multiple  Vitamin (MULTIVITAMIN WITH MINERALS) TABS tablet Take 1 tablet by mouth daily.    . multivitamin-iron-minerals-folic acid (CENTRUM) chewable tablet Chew 1 tablet by mouth daily.    . nicotine (NICODERM CQ - DOSED IN MG/24 HOURS) 14 mg/24hr patch Place 1 patch (14 mg total) onto the skin daily. 28 patch 0  . omega-3 acid ethyl esters (LOVAZA) 1 g capsule Take 1 g by mouth daily.    . Omega-3 Fatty Acids (FISH OIL PO) Take 1 tablet by mouth daily.    Marland Kitchen oxyCODONE-acetaminophen (PERCOCET) 10-325 MG tablet Take 1 tablet by mouth every 6 (six) hours as needed for  pain. 40 tablet 0  . polyethylene glycol (MIRALAX / GLYCOLAX) packet Take 17 g by mouth daily as needed for mild constipation.    . terbinafine (LAMISIL) 250 MG tablet Take 250 mg by mouth daily.     No current facility-administered medications on file prior Bean visit.   No Known Allergies  No results found for this or any previous visit (from the past 2160 hour(s)).  Objective: There were no vitals filed for this visit.  General: Patient is awake, alert, oriented in no acute distress  Dermatology: Skin is warm and dry bilateral with a partial thickness ulceration present at Right amputation stump. Ulceration plantar stump that measures post debridement measure 0.8x 0.3 x 0.3 cm with a granular base with keratotic margins. The ulceration does not probe Bean bone.  There is no malodor, no active drainage, no erythema, trace edema Bean stump on right.  No other acute signs of infection.   Vascular: Dorsalis Pedis pulse = 1/4 Bilateral,  Posterior Tibial pulse = 0/4 Bilateral,  Capillary Fill Time < 5 seconds  Neurologic: Protective sensation absent bilateral.    Musculosketal: No pain with palpation Bean ulcerated area.  Status post right foot TMA.  No pain with compression Bean calves bilateral.  No results for input(s): GRAMSTAIN, LABORGA in the last 8760 hours.  Assessment and Plan:  Problem List Items Addressed This Visit      Endocrine   Diabetic polyneuropathy associated with type 2 diabetes mellitus ([place])     Other   History of transmetatarsal amputation of right foot ([place])    Other Visit Diagnoses    Skin ulcer of right foot, limited Bean breakdown of skin ([place])    -  Primary     -Re-examined patient and discussed the progression of the wound and treatment alternatives. -Excisionally dedbrided ulceration at right amputation stump Bean healthy bleeding borders removing nonviable tissue using a sterile chisel blade. Wound measures post debridement as above. Wound was debrided Bean the  level of the dermis with viable wound base exposed Bean promote healing. Hemostasis was achieved with manuel pressure. Patient tolerated procedure well without any discomfort or anesthesia necessary for this wound debridement. -Applied NuShield 2 x 3 cm, donor [id number], ID number [id number] expiration 05/14/2023  secured with steristrips and dry sterile dressing.  This is application #7 of a graft Bean the area but only the 3rd time that NuShield has been used. Home nursing has discharged patient and will follow weekly in office for wound care. -Advised patient Bean keep dressing clean, dry, and intact - Advised patient Bean go Bean the ER or return Bean office if the wound worsens or if constitutional symptoms are present. -Smoking cessation encouraged  -Patient Bean return Bean office in 1 week for follow up wound care/NuShield application.   Landis Martins, DPM

## 2019-11-05 ENCOUNTER — Ambulatory Visit: Payer: Medicare Other | Admitting: Sports Medicine

## 2019-11-12 ENCOUNTER — Encounter: Payer: Self-pay | Admitting: Sports Medicine

## 2019-11-12 ENCOUNTER — Other Ambulatory Visit: Payer: Self-pay

## 2019-11-12 ENCOUNTER — Ambulatory Visit (INDEPENDENT_AMBULATORY_CARE_PROVIDER_SITE_OTHER): Payer: BC Managed Care – PPO | Admitting: Sports Medicine

## 2019-11-12 DIAGNOSIS — L97511 Non-pressure chronic ulcer of other part of right foot limited to breakdown of skin: Secondary | ICD-10-CM | POA: Diagnosis not present

## 2019-11-12 DIAGNOSIS — E1142 Type 2 diabetes mellitus with diabetic polyneuropathy: Secondary | ICD-10-CM

## 2019-11-12 DIAGNOSIS — Z89431 Acquired absence of right foot: Secondary | ICD-10-CM | POA: Diagnosis not present

## 2019-11-12 DIAGNOSIS — I739 Peripheral vascular disease, unspecified: Secondary | ICD-10-CM | POA: Diagnosis not present

## 2019-11-12 NOTE — Progress Notes (Signed)
Subjective: Adrian Bean is a 69 y.o. male patient seen in office for follow up evaluation of ulceration of the right foot amputation stump.  Patient had Nushield application # 3 applied last visit.  Patient reports he was able to keep the dressing clean dry and intact and had no issues. Patient denies increase in pain, redness, warmth, swelling, nausea, vomiting, fever, chills, or any other constitutional symptoms at this time.  Fasting blood sugar does not check as previously noted last A1c 6 per patient report.   Patient Active Problem List   Diagnosis Date Noted  . Infected surgical wound 06/18/2018  . Peripheral polyneuropathy 06/18/2018  . Mixed dyslipidemia 06/17/2018  . Essential hypertension 12/12/2017  . Need for hepatitis C screening test 12/12/2017  . Screening for colon cancer 12/12/2017  . Diabetic polyneuropathy associated with type 2 diabetes mellitus ([place]) 09/17/2017  . 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 History of transmetatarsal amputation of right foot ([place]) 07/04/2017  . PAD (peripheral artery disease) ([place]) 06/02/2017  . Sepsis ([place]) 06/02/2017  . DM (diabetes mellitus), type 2 ([place]) 06/02/2017  . Tobacco abuse 06/02/2017  . Tick bite 01/22/2017  . Pain in limb 10/21/2011  . Swelling of limb 10/21/2011   Current Outpatient Medications on File Prior to Visit  Medication Sig Dispense Refill  . albuterol (PROVENTIL) (2.5 MG/3ML) 0.083% nebulizer solution Take 3 mLs (2.5 mg total) by nebulization every 4 (four) hours as needed for wheezing or shortness of breath. 75 mL 12  . Amino Acids-Protein Hydrolys (FEEDING SUPPLEMENT, PRO-STAT SUGAR FREE 64,) LIQD Take 30 mLs by mouth 2 (two) times daily. 900 mL 0  . aspirin EC 81 MG EC tablet Take 1 tablet (81 mg total) by mouth daily.    Marland Kitchen atorvastatin (LIPITOR) 10 MG tablet Take 10 mg by mouth daily.    Marland Kitchen atorvastatin (LIPITOR) 10 MG tablet Take 10 mg by mouth  daily at 6 PM.    . bisacodyl (DULCOLAX) 10 MG suppository Place 1 suppository (10 mg total) rectally daily as needed for moderate constipation. 12 suppository 0  . ciprofloxacin (CIPRO) 500 MG tablet Take 1 tablet (500 mg total) by mouth 2 (two) times daily. 20 tablet 0  . clindamycin (CLEOCIN) 300 MG capsule     . clopidogrel (PLAVIX) 75 MG tablet Take 75 mg by mouth daily.    . collagenase (SANTYL) ointment Apply topically daily. 15 g 0  . feeding supplement, GLUCERNA SHAKE, (GLUCERNA SHAKE) LIQD Take 237 mLs by mouth 3 (three) times daily between meals.  0  . gabapentin (NEURONTIN) 300 MG capsule Take 300 mg by mouth 3 (three) times daily.    Marland Kitchen gabapentin (NEURONTIN) 300 MG capsule Take 600 mg by mouth 3 (three) times daily.    Marland Kitchen glimepiride (AMARYL) 2 MG tablet Take 2 mg by mouth daily with breakfast.    . glimepiride (AMARYL) 2 MG tablet Take 2 mg by mouth daily with breakfast.    . HEPARIN LOCK FLUSH IV Inject into the vein.    Marland Kitchen HYDROcodone-acetaminophen (NORCO) 10-325 MG tablet Take 1 tablet by mouth every 6 (six) hours as needed for moderate pain.    Marland Kitchen lisinopril (PRINIVIL,ZESTRIL) 10 MG tablet Take 10 mg by mouth daily.    Marland Kitchen lisinopril (PRINIVIL,ZESTRIL) 10 MG tablet Take 10 mg by mouth daily.    Marland Kitchen lisinopril-hydrochlorothiazide (ZESTORETIC) 20-25 MG tablet     . Multiple Vitamin (MULTIVITAMIN WITH MINERALS) TABS tablet Take 1 tablet  by mouth daily.    . multivitamin-iron-minerals-folic acid (CENTRUM) chewable tablet Chew 1 tablet by mouth daily.    . nicotine (NICODERM CQ - DOSED IN MG/24 HOURS) 14 mg/24hr patch Place 1 patch (14 mg total) onto the skin daily. 28 patch 0  . omega-3 acid ethyl esters (LOVAZA) 1 g capsule Take 1 g by mouth daily.    . Omega-3 Fatty Acids (FISH OIL PO) Take 1 tablet by mouth daily.    Marland Kitchen oxyCODONE-acetaminophen (PERCOCET) 10-325 MG tablet Take 1 tablet by mouth every 6 (six) hours as needed for pain. 40 tablet 0  . polyethylene glycol (MIRALAX /  GLYCOLAX) packet Take 17 g by mouth daily as needed for mild constipation.    . terbinafine (LAMISIL) 250 MG tablet Take 250 mg by mouth daily.     No current facility-administered medications on file prior to visit.   No Known Allergies  No results found for this or any previous visit (from the past 2160 hour(s)).  Objective: There were no vitals filed for this visit.  General: Patient is awake, alert, oriented in no acute distress  Dermatology: Skin is warm and dry bilateral with a partial thickness ulceration present at Right amputation stump. Ulceration plantar stump that measures post debridement measure 0.8x 0.3 x 0.3 cm predebridement and post debridement measure 1.3 x 0.3 x 0.3 cm with a granular base with keratotic margins. The ulceration does not probe to bone.  There is no malodor, no active drainage, no erythema, trace edema to stump on right.  No other acute signs of infection.  Vascular: Dorsalis Pedis pulse = 1/4 Bilateral,  Posterior Tibial pulse = 0/4 Bilateral,  Capillary Fill Time < 5 seconds  Neurologic: Protective sensation absent bilateral.    Musculosketal: No pain with palpation to ulcerated area.  Status post right foot TMA.  No pain with compression to calves bilateral.  No results for input(s): GRAMSTAIN, LABORGA in the last 8760 hours.  Assessment and Plan:  Problem List Items Addressed This Visit      Cardiovascular and Mediastinum   PAD (peripheral artery disease) ([place])     Endocrine   Diabetic polyneuropathy associated with type 2 diabetes mellitus ([place])     Other   History of transmetatarsal amputation of right foot ([place])    Other Visit Diagnoses    Skin ulcer of right foot, limited to breakdown of skin ([place])    -  Primary     -Re-examined patient and discussed the progression of the wound and treatment alternatives. -Excisionally dedbrided ulceration at right amputation stump to healthy bleeding borders removing nonviable tissue using a  sterile chisel blade. Wound measures post debridement as above. Wound was debrided to the level of the dermis with viable wound base exposed to promote healing. Hemostasis was achieved with manuel pressure. Patient tolerated procedure well without any discomfort or anesthesia necessary for this wound debridement. -Applied NuShield 3.2 x 3.2 cm, donor # [id number], ID number [id number] expiration 07/27/2024 secured with steristrips and offloading padding and dry sterile dressing.  This is application #4 of nuShield d has been used -Advised patient to keep dressing clean, dry, and intact - Advised patient to go to the ER or return to office if the wound worsens or if constitutional symptoms are present. -Smoking cessation encouraged again this visit -Patient to return to office in 1 week for follow up wound care/NuShield application.   Landis Martins, DPM

## 2019-11-15 ENCOUNTER — Telehealth: Payer: Self-pay | Admitting: *Deleted

## 2019-11-15 NOTE — Telephone Encounter (Signed)
-----   Message from Landis Martins, [place] sent at 11/12/2019  1:56 PM EDT ----- Regarding: Reorder Roland Rack with [facility] to reorder for next visit

## 2019-11-15 NOTE — Telephone Encounter (Signed)
Emailed orders to [employer]. Fransisco Beau.

## 2019-11-19 ENCOUNTER — Other Ambulatory Visit: Payer: Self-pay

## 2019-11-19 ENCOUNTER — Encounter: Payer: Self-pay | Admitting: Sports Medicine

## 2019-11-19 ENCOUNTER — Ambulatory Visit (INDEPENDENT_AMBULATORY_CARE_PROVIDER_SITE_OTHER): Payer: BC Managed Care – PPO | Admitting: Sports Medicine

## 2019-11-19 DIAGNOSIS — L97511 Non-pressure chronic ulcer of other part of right foot limited to breakdown of skin: Secondary | ICD-10-CM | POA: Diagnosis not present

## 2019-11-19 DIAGNOSIS — Z89431 Acquired absence of right foot: Secondary | ICD-10-CM | POA: Diagnosis not present

## 2019-11-19 DIAGNOSIS — E1142 Type 2 diabetes mellitus with diabetic polyneuropathy: Secondary | ICD-10-CM

## 2019-11-19 DIAGNOSIS — I739 Peripheral vascular disease, unspecified: Secondary | ICD-10-CM

## 2019-11-19 NOTE — Progress Notes (Signed)
Subjective: Adrian Bean is a 69 y.o. male patient seen in office for follow up evaluation of ulceration of the right foot amputation stump.  Patient had [place] application # 4 applied last visit.  Patient reports he was able to keep the dressing clean dry and intact but did have a little soreness upon removal of today's dressings.  Patient denies increase in pain, redness, warmth, swelling, nausea, vomiting, fever, chills, or any other constitutional symptoms at this time.  Fasting blood sugar does not check as previously noted last A1c 6 per patient report.   Patient Active Problem List   Diagnosis Date Noted  . Infected surgical wound 06/18/2018  . Peripheral polyneuropathy 06/18/2018  . Mixed dyslipidemia 06/17/2018  . Essential hypertension 12/12/2017  . Need for hepatitis C screening test 12/12/2017  . Screening for colon cancer 12/12/2017  . Diabetic polyneuropathy associated with type 2 diabetes mellitus ([place]) 09/17/2017  . 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 History of transmetatarsal amputation of right foot ([place]) 07/04/2017  . PAD (peripheral artery disease) ([place]) 06/02/2017  . Sepsis ([place]) 06/02/2017  . DM (diabetes mellitus), type 2 ([place]) 06/02/2017  . Tobacco abuse 06/02/2017  . Tick bite 01/22/2017  . Pain in limb 10/21/2011  . Swelling of limb 10/21/2011   Current Outpatient Medications on File Prior to Visit  Medication Sig Dispense Refill  . albuterol (PROVENTIL) (2.5 MG/3ML) 0.083% nebulizer solution Take 3 mLs (2.5 mg total) by nebulization every 4 (four) hours as needed for wheezing or shortness of breath. 75 mL 12  . Amino Acids-Protein Hydrolys (FEEDING SUPPLEMENT, PRO-STAT SUGAR FREE 64,) LIQD Take 30 mLs by mouth 2 (two) times daily. 900 mL 0  . aspirin EC 81 MG EC tablet Take 1 tablet (81 mg total) by mouth daily.    Marland Kitchen atorvastatin (LIPITOR) 10 MG tablet Take 10 mg by mouth daily.    Marland Kitchen  atorvastatin (LIPITOR) 10 MG tablet Take 10 mg by mouth daily at 6 PM.    . bisacodyl (DULCOLAX) 10 MG suppository Place 1 suppository (10 mg total) rectally daily as needed for moderate constipation. 12 suppository 0  . ciprofloxacin (CIPRO) 500 MG tablet Take 1 tablet (500 mg total) by mouth 2 (two) times daily. 20 tablet 0  . clindamycin (CLEOCIN) 300 MG capsule     . clopidogrel (PLAVIX) 75 MG tablet Take 75 mg by mouth daily.    . collagenase (SANTYL) ointment Apply topically daily. 15 g 0  . feeding supplement, GLUCERNA SHAKE, (GLUCERNA SHAKE) LIQD Take 237 mLs by mouth 3 (three) times daily between meals.  0  . gabapentin (NEURONTIN) 300 MG capsule Take 300 mg by mouth 3 (three) times daily.    Marland Kitchen gabapentin (NEURONTIN) 300 MG capsule Take 600 mg by mouth 3 (three) times daily.    Marland Kitchen glimepiride (AMARYL) 2 MG tablet Take 2 mg by mouth daily with breakfast.    . glimepiride (AMARYL) 2 MG tablet Take 2 mg by mouth daily with breakfast.    . HEPARIN LOCK FLUSH IV Inject into the vein.    Marland Kitchen HYDROcodone-acetaminophen (NORCO) 10-325 MG tablet Take 1 tablet by mouth every 6 (six) hours as needed for moderate pain.    Marland Kitchen lisinopril (PRINIVIL,ZESTRIL) 10 MG tablet Take 10 mg by mouth daily.    Marland Kitchen lisinopril (PRINIVIL,ZESTRIL) 10 MG tablet Take 10 mg by mouth daily.    Marland Kitchen lisinopril-hydrochlorothiazide (ZESTORETIC) 20-25 MG tablet     . Multiple Vitamin (  MULTIVITAMIN WITH MINERALS) TABS tablet Take 1 tablet by mouth daily.    . multivitamin-iron-minerals-folic acid (CENTRUM) chewable tablet Chew 1 tablet by mouth daily.    . nicotine (NICODERM CQ - DOSED IN MG/24 HOURS) 14 mg/24hr patch Place 1 patch (14 mg total) onto the skin daily. 28 patch 0  . omega-3 acid ethyl esters (LOVAZA) 1 g capsule Take 1 g by mouth daily.    . Omega-3 Fatty Acids (FISH OIL PO) Take 1 tablet by mouth daily.    Marland Kitchen oxyCODONE-acetaminophen (PERCOCET) 10-325 MG tablet Take 1 tablet by mouth every 6 (six) hours as needed for  pain. 40 tablet 0  . polyethylene glycol (MIRALAX / GLYCOLAX) packet Take 17 g by mouth daily as needed for mild constipation.    . terbinafine (LAMISIL) 250 MG tablet Take 250 mg by mouth daily.     No current facility-administered medications on file prior to visit.   No Known Allergies  No results found for this or any previous visit (from the past 2160 hour(s)).  Objective: There were no vitals filed for this visit.  General: Patient is awake, alert, oriented in no acute distress  Dermatology: Skin is warm and dry bilateral with a partial thickness ulceration present at Right amputation stump. Ulceration plantar stump that measures post debridement measure 1x 0.6 x 0.3 cm predebridement and post debridement measure 1.8 x 0.9 x 0.3 cm with a granular base with macerated and keratotic margins. The ulceration does not probe to bone.  There is no malodor, no active drainage, no erythema, trace edema to stump on right.  No other acute signs of infection.  Vascular: Dorsalis Pedis pulse = 1/4 Bilateral,  Posterior Tibial pulse = 0/4 Bilateral,  Capillary Fill Time < 5 seconds, 1+ pitting edema.  Neurologic: Protective sensation absent bilateral.    Musculosketal: No pain with palpation to ulcerated area.  Status post right foot TMA.  No pain with compression to calves bilateral.  No results for input(s): GRAMSTAIN, LABORGA in the last 8760 hours.  Assessment and Plan:  Problem List Items Addressed This Visit      Cardiovascular and Mediastinum   PAD (peripheral artery disease) ([place])     Endocrine   Diabetic polyneuropathy associated with type 2 diabetes mellitus ([place])     Other   History of transmetatarsal amputation of right foot ([place])    Other Visit Diagnoses    Skin ulcer of right foot, limited to breakdown of skin ([place])    -  Primary     -Re-examined patient and discussed the progression of the wound and treatment alternatives. -Excisionally dedbrided ulceration at right  amputation stump to healthy bleeding borders removing nonviable tissue using a sterile chisel blade. Wound measures post debridement as above. Wound was debrided to the level of the dermis with viable wound base exposed to promote healing. Hemostasis was achieved with manuel pressure. Patient tolerated procedure well without any discomfort or anesthesia necessary for this wound debridement. -Due to the maceration skin graft was not applied at this visit -Applied protective gauze dressing and Betadine and Unna boot and advised patient to keep clean dry and intact for the week -Applied offloading padding in shoe - Advised patient to go to the ER or return to office if the wound worsens or if constitutional symptoms are present. -Patient to return to office in 1 week for follow up wound care/NuShield application.   Landis Martins, DPM

## 2019-11-26 ENCOUNTER — Other Ambulatory Visit: Payer: Self-pay

## 2019-11-26 ENCOUNTER — Ambulatory Visit (INDEPENDENT_AMBULATORY_CARE_PROVIDER_SITE_OTHER): Payer: BC Managed Care – PPO | Admitting: Sports Medicine

## 2019-11-26 ENCOUNTER — Encounter: Payer: Self-pay | Admitting: Sports Medicine

## 2019-11-26 DIAGNOSIS — Z89431 Acquired absence of right foot: Secondary | ICD-10-CM

## 2019-11-26 DIAGNOSIS — L97511 Non-pressure chronic ulcer of other part of right foot limited to breakdown of skin: Secondary | ICD-10-CM

## 2019-11-26 DIAGNOSIS — I739 Peripheral vascular disease, unspecified: Secondary | ICD-10-CM

## 2019-11-26 DIAGNOSIS — E1142 Type 2 diabetes mellitus with diabetic polyneuropathy: Secondary | ICD-10-CM

## 2019-11-26 NOTE — Progress Notes (Signed)
Subjective: Adrian Bean is a 69 y.o. male patient seen in office for follow up evaluation of ulceration of the right foot amputation stump.  Patient had a Unna boot placed last visit.  Reports that his leg swelling and he was able to keep the dressing clean dry and intact without any problems.  Patient denies increase in pain, redness, warmth, swelling, nausea, vomiting, fever, chills, or any other constitutional symptoms at this time.  Fasting blood sugar does not check as previously noted last A1c 6 per patient report.   Patient Active Problem List   Diagnosis Date Noted  . Infected surgical wound 06/18/2018  . Peripheral polyneuropathy 06/18/2018  . Mixed dyslipidemia 06/17/2018  . Essential hypertension 12/12/2017  . Need for hepatitis C screening test 12/12/2017  . Screening for colon cancer 12/12/2017  . Diabetic polyneuropathy associated with type 2 diabetes mellitus ([place]) 09/17/2017  . 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 History of transmetatarsal amputation of right foot ([place]) 07/04/2017  . PAD (peripheral artery disease) ([place]) 06/02/2017  . Sepsis ([place]) 06/02/2017  . DM (diabetes mellitus), type 2 ([place]) 06/02/2017  . Tobacco abuse 06/02/2017  . Tick bite 01/22/2017  . Pain in limb 10/21/2011  . Swelling of limb 10/21/2011   Current Outpatient Medications on File Prior to Visit  Medication Sig Dispense Refill  . albuterol (PROVENTIL) (2.5 MG/3ML) 0.083% nebulizer solution Take 3 mLs (2.5 mg total) by nebulization every 4 (four) hours as needed for wheezing or shortness of breath. 75 mL 12  . Amino Acids-Protein Hydrolys (FEEDING SUPPLEMENT, PRO-STAT SUGAR FREE 64,) LIQD Take 30 mLs by mouth 2 (two) times daily. 900 mL 0  . aspirin EC 81 MG EC tablet Take 1 tablet (81 mg total) by mouth daily.    Marland Kitchen atorvastatin (LIPITOR) 10 MG tablet Take 10 mg by mouth daily.    Marland Kitchen atorvastatin (LIPITOR) 10 MG tablet Take 10 mg by  mouth daily at 6 PM.    . bisacodyl (DULCOLAX) 10 MG suppository Place 1 suppository (10 mg total) rectally daily as needed for moderate constipation. 12 suppository 0  . ciprofloxacin (CIPRO) 500 MG tablet Take 1 tablet (500 mg total) by mouth 2 (two) times daily. 20 tablet 0  . clindamycin (CLEOCIN) 300 MG capsule     . clopidogrel (PLAVIX) 75 MG tablet Take 75 mg by mouth daily.    . collagenase (SANTYL) ointment Apply topically daily. 15 g 0  . feeding supplement, GLUCERNA SHAKE, (GLUCERNA SHAKE) LIQD Take 237 mLs by mouth 3 (three) times daily between meals.  0  . gabapentin (NEURONTIN) 300 MG capsule Take 300 mg by mouth 3 (three) times daily.    Marland Kitchen gabapentin (NEURONTIN) 300 MG capsule Take 600 mg by mouth 3 (three) times daily.    Marland Kitchen glimepiride (AMARYL) 2 MG tablet Take 2 mg by mouth daily with breakfast.    . glimepiride (AMARYL) 2 MG tablet Take 2 mg by mouth daily with breakfast.    . HEPARIN LOCK FLUSH IV Inject into the vein.    Marland Kitchen HYDROcodone-acetaminophen (NORCO) 10-325 MG tablet Take 1 tablet by mouth every 6 (six) hours as needed for moderate pain.    Marland Kitchen lisinopril (PRINIVIL,ZESTRIL) 10 MG tablet Take 10 mg by mouth daily.    Marland Kitchen lisinopril (PRINIVIL,ZESTRIL) 10 MG tablet Take 10 mg by mouth daily.    Marland Kitchen lisinopril-hydrochlorothiazide (ZESTORETIC) 20-25 MG tablet     . Multiple Vitamin (MULTIVITAMIN WITH MINERALS) TABS tablet  Take 1 tablet by mouth daily.    . multivitamin-iron-minerals-folic acid (CENTRUM) chewable tablet Chew 1 tablet by mouth daily.    . nicotine (NICODERM CQ - DOSED IN MG/24 HOURS) 14 mg/24hr patch Place 1 patch (14 mg total) onto the skin daily. 28 patch 0  . omega-3 acid ethyl esters (LOVAZA) 1 g capsule Take 1 g by mouth daily.    . Omega-3 Fatty Acids (FISH OIL PO) Take 1 tablet by mouth daily.    Marland Kitchen oxyCODONE-acetaminophen (PERCOCET) 10-325 MG tablet Take 1 tablet by mouth every 6 (six) hours as needed for pain. 40 tablet 0  . polyethylene glycol (MIRALAX /  GLYCOLAX) packet Take 17 g by mouth daily as needed for mild constipation.    . terbinafine (LAMISIL) 250 MG tablet Take 250 mg by mouth daily.     No current facility-administered medications on file prior to visit.   No Known Allergies  No results found for this or any previous visit (from the past 2160 hour(s)).  Objective: There were no vitals filed for this visit.  General: Patient is awake, alert, oriented in no acute distress  Dermatology: Skin is warm and dry bilateral with a partial thickness ulceration present at Right amputation stump. Ulceration plantar stump that measures post debridement measure 0.9x 0.4 x 0.2 cm predebridement and post debridement measure 1.1 x 0.4 x 0.2 cm with a granular base with macerated and keratotic margins. The ulceration does not probe to bone.  There is no malodor, no active drainage, no erythema, trace edema to stump on right.  No other acute signs of infection.  Vascular: Dorsalis Pedis pulse = 1/4 Bilateral,  Posterior Tibial pulse = 0/4 Bilateral,  Capillary Fill Time < 5 seconds, 1+ pitting edema.  Neurologic: Protective sensation absent bilateral.    Musculosketal: No pain with palpation to ulcerated area.  Status post right foot TMA.  No pain with compression to calves bilateral.  No results for input(s): GRAMSTAIN, LABORGA in the last 8760 hours.  Assessment and Plan:  Problem List Items Addressed This Visit      Cardiovascular and Mediastinum   PAD (peripheral artery disease) ([place])     Endocrine   Diabetic polyneuropathy associated with type 2 diabetes mellitus ([place])     Other   History of transmetatarsal amputation of right foot ([place])    Other Visit Diagnoses    Skin ulcer of right foot, limited to breakdown of skin ([place])    -  Primary     -Re-examined patient and discussed the progression of the wound and treatment alternatives. -Excisionally dedbrided ulceration at right amputation stump to healthy bleeding borders  removing nonviable tissue using a sterile chisel blade. Wound measures post debridement as above. Wound was debrided to the level of the dermis with viable wound base exposed to promote healing. Hemostasis was achieved with manuel pressure. Patient tolerated procedure well without any discomfort or anesthesia necessary for this wound debridement. -Due to swelling skin graft was not applied this week -Applied protective gauze dressing and Betadine plantarly and Adaptic to anterior ankle covered with Unna boot and advised patient to keep clean dry and intact for the week -Continue with offloading padding in shoe - Advised patient to go to the ER or return to office if the wound worsens or if constitutional symptoms are present. -Patient to return to office in 1 week for follow up wound care/NuShield application.   Landis Martins, DPM

## 2019-12-03 ENCOUNTER — Encounter: Payer: Self-pay | Admitting: Sports Medicine

## 2019-12-03 ENCOUNTER — Other Ambulatory Visit: Payer: Self-pay

## 2019-12-03 ENCOUNTER — Ambulatory Visit (INDEPENDENT_AMBULATORY_CARE_PROVIDER_SITE_OTHER): Payer: BC Managed Care – PPO | Admitting: Sports Medicine

## 2019-12-03 DIAGNOSIS — Z89431 Acquired absence of right foot: Secondary | ICD-10-CM

## 2019-12-03 DIAGNOSIS — E1142 Type 2 diabetes mellitus with diabetic polyneuropathy: Secondary | ICD-10-CM

## 2019-12-03 DIAGNOSIS — I739 Peripheral vascular disease, unspecified: Secondary | ICD-10-CM

## 2019-12-03 DIAGNOSIS — L97511 Non-pressure chronic ulcer of other part of right foot limited to breakdown of skin: Secondary | ICD-10-CM

## 2019-12-03 NOTE — Progress Notes (Signed)
Subjective: Adrian Bean is a 69 y.o. male patient seen in office for follow up evaluation of ulceration of the right foot amputation stump.  Patient had a Unna boot placed last visit.  Reports that he was able to keep it clean dry and intact and denies any problems or anything different.  Patient denies increase in pain, redness, warmth, swelling, nausea, vomiting, fever, chills, or any other constitutional symptoms at this time.  Fasting blood sugar does not check as previously noted   Patient Active Problem List   Diagnosis Date Noted  . Infected surgical wound 06/18/2018  . Peripheral polyneuropathy 06/18/2018  . Mixed dyslipidemia 06/17/2018  . Essential hypertension 12/12/2017  . Need for hepatitis C screening test 12/12/2017  . Screening for colon cancer 12/12/2017  . Diabetic polyneuropathy associated with type 2 diabetes mellitus ([place]) 09/17/2017  . 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 History of transmetatarsal amputation of right foot ([place]) 07/04/2017  . PAD (peripheral artery disease) ([place]) 06/02/2017  . Sepsis ([place]) 06/02/2017  . DM (diabetes mellitus), type 2 ([place]) 06/02/2017  . Tobacco abuse 06/02/2017  . Tick bite 01/22/2017  . Pain in limb 10/21/2011  . Swelling of limb 10/21/2011   Current Outpatient Medications on File Prior to Visit  Medication Sig Dispense Refill  . albuterol (PROVENTIL) (2.5 MG/3ML) 0.083% nebulizer solution Take 3 mLs (2.5 mg total) by nebulization every 4 (four) hours as needed for wheezing or shortness of breath. 75 mL 12  . Amino Acids-Protein Hydrolys (FEEDING SUPPLEMENT, PRO-STAT SUGAR FREE 64,) LIQD Take 30 mLs by mouth 2 (two) times daily. 900 mL 0  . aspirin EC 81 MG EC tablet Take 1 tablet (81 mg total) by mouth daily.    Marland Kitchen atorvastatin (LIPITOR) 10 MG tablet Take 10 mg by mouth daily.    Marland Kitchen atorvastatin (LIPITOR) 10 MG tablet Take 10 mg by mouth daily at 6 PM.    . bisacodyl  (DULCOLAX) 10 MG suppository Place 1 suppository (10 mg total) rectally daily as needed for moderate constipation. 12 suppository 0  . ciprofloxacin (CIPRO) 500 MG tablet Take 1 tablet (500 mg total) by mouth 2 (two) times daily. 20 tablet 0  . clindamycin (CLEOCIN) 300 MG capsule     . clopidogrel (PLAVIX) 75 MG tablet Take 75 mg by mouth daily.    . collagenase (SANTYL) ointment Apply topically daily. 15 g 0  . feeding supplement, GLUCERNA SHAKE, (GLUCERNA SHAKE) LIQD Take 237 mLs by mouth 3 (three) times daily between meals.  0  . gabapentin (NEURONTIN) 300 MG capsule Take 300 mg by mouth 3 (three) times daily.    Marland Kitchen gabapentin (NEURONTIN) 300 MG capsule Take 600 mg by mouth 3 (three) times daily.    Marland Kitchen glimepiride (AMARYL) 2 MG tablet Take 2 mg by mouth daily with breakfast.    . glimepiride (AMARYL) 2 MG tablet Take 2 mg by mouth daily with breakfast.    . HEPARIN LOCK FLUSH IV Inject into the vein.    Marland Kitchen HYDROcodone-acetaminophen (NORCO) 10-325 MG tablet Take 1 tablet by mouth every 6 (six) hours as needed for moderate pain.    Marland Kitchen lisinopril (PRINIVIL,ZESTRIL) 10 MG tablet Take 10 mg by mouth daily.    Marland Kitchen lisinopril (PRINIVIL,ZESTRIL) 10 MG tablet Take 10 mg by mouth daily.    Marland Kitchen lisinopril-hydrochlorothiazide (ZESTORETIC) 20-25 MG tablet     . Multiple Vitamin (MULTIVITAMIN WITH MINERALS) TABS tablet Take 1 tablet by mouth daily.    Marland Kitchen  multivitamin-iron-minerals-folic acid (CENTRUM) chewable tablet Chew 1 tablet by mouth daily.    . nicotine (NICODERM CQ - DOSED IN MG/24 HOURS) 14 mg/24hr patch Place 1 patch (14 mg total) onto the skin daily. 28 patch 0  . omega-3 acid ethyl esters (LOVAZA) 1 g capsule Take 1 g by mouth daily.    . Omega-3 Fatty Acids (FISH OIL PO) Take 1 tablet by mouth daily.    Marland Kitchen oxyCODONE-acetaminophen (PERCOCET) 10-325 MG tablet Take 1 tablet by mouth every 6 (six) hours as needed for pain. 40 tablet 0  . polyethylene glycol (MIRALAX / GLYCOLAX) packet Take 17 g by mouth  daily as needed for mild constipation.    . terbinafine (LAMISIL) 250 MG tablet Take 250 mg by mouth daily.     No current facility-administered medications on file prior to visit.   No Known Allergies  No results found for this or any previous visit (from the past 2160 hour(s)).  Objective: There were no vitals filed for this visit.  General: Patient is awake, alert, oriented in no acute distress  Dermatology: Skin is warm and dry bilateral with a partial thickness ulceration present at Right amputation stump. Ulceration plantar stump that measures post debridement measure 0.7x 0.2 x 0.2 cm predebridement and post debridement measure 0.9 x 0.3x 0.2 cm with a granular base with macerated and keratotic margins. The ulceration does not probe to bone.  There is no malodor, no active drainage, no erythema, trace edema to stump on right.  No other acute signs of infection.  Vascular: Dorsalis Pedis pulse = 1/4 Bilateral,  Posterior Tibial pulse = 0/4 Bilateral,  Capillary Fill Time < 5 seconds, 1+ pitting edema.  Neurologic: Protective sensation absent bilateral.    Musculosketal: No pain with palpation to ulcerated area.  Status post right foot TMA.  No pain with compression to calves bilateral.  No results for input(s): GRAMSTAIN, LABORGA in the last 8760 hours.  Assessment and Plan:  Problem List Items Addressed This Visit      Cardiovascular and Mediastinum   PAD (peripheral artery disease) ([place])     Endocrine   Diabetic polyneuropathy associated with type 2 diabetes mellitus ([place])     Other   History of transmetatarsal amputation of right foot ([place])    Other Visit Diagnoses    Skin ulcer of right foot, limited to breakdown of skin ([place])    -  Primary     -Re-examined patient and discussed the progression of the wound and treatment alternatives. -Excisionally dedbrided ulceration at right amputation stump to healthy bleeding borders removing nonviable tissue using a sterile  chisel blade. Wound measures post debridement as above. Wound was debrided to the level of the dermis with viable wound base exposed to promote healing. Hemostasis was achieved with manuel pressure.  Minimal bleeding noted.  Patient tolerated procedure well without any discomfort or anesthesia necessary for this wound debridement. -Due to swelling and the patient making improvement with the Unna boot treatment we will continue with this skin graft not applied this week. -Applied protective gauze dressing and Betadine plantarly and Adaptic to anterior ankle covered with Unna boot and advised patient to keep clean dry and intact for the week -Continue with offloading padding in shoe - Advised patient to go to the ER or return to office if the wound worsens or if constitutional symptoms are present. -Patient to return to office in 1 week for follow up wound care/NuShield application.   Landis Martins, DPM

## 2019-12-10 ENCOUNTER — Ambulatory Visit (INDEPENDENT_AMBULATORY_CARE_PROVIDER_SITE_OTHER): Payer: BC Managed Care – PPO | Admitting: Sports Medicine

## 2019-12-10 ENCOUNTER — Other Ambulatory Visit: Payer: Self-pay

## 2019-12-10 ENCOUNTER — Encounter: Payer: Self-pay | Admitting: Sports Medicine

## 2019-12-10 DIAGNOSIS — I739 Peripheral vascular disease, unspecified: Secondary | ICD-10-CM

## 2019-12-10 DIAGNOSIS — Z89431 Acquired absence of right foot: Secondary | ICD-10-CM

## 2019-12-10 DIAGNOSIS — E1142 Type 2 diabetes mellitus with diabetic polyneuropathy: Secondary | ICD-10-CM | POA: Diagnosis not present

## 2019-12-10 DIAGNOSIS — L97511 Non-pressure chronic ulcer of other part of right foot limited to breakdown of skin: Secondary | ICD-10-CM | POA: Diagnosis not present

## 2019-12-10 NOTE — Progress Notes (Signed)
Subjective: Adrian Bean is a 69 y.o. male patient seen in office for follow up evaluation of ulceration of the right foot amputation stump.  Patient had a Unna boot placed last visit.  Reports that he noticed more drainage and is not sure if his wound has changed and he has a new spot above the original ulcer.  Patient denies increase in pain, redness, warmth, swelling, nausea, vomiting, fever, chills, or any other constitutional symptoms at this time.  Fasting blood sugar does not check as previously noted  Admits to a history of to smoking.   Patient Active Problem List   Diagnosis Date Noted  . Infected surgical wound 06/18/2018  . Peripheral polyneuropathy 06/18/2018  . Mixed dyslipidemia 06/17/2018  . Essential hypertension 12/12/2017  . Need for hepatitis C screening test 12/12/2017  . Screening for colon cancer 12/12/2017  . Diabetic polyneuropathy associated with type 2 diabetes mellitus ([place]) 09/17/2017  . 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 History of transmetatarsal amputation of right foot ([place]) 07/04/2017  . PAD (peripheral artery disease) ([place]) 06/02/2017  . Sepsis ([place]) 06/02/2017  . DM (diabetes mellitus), type 2 ([place]) 06/02/2017  . Tobacco abuse 06/02/2017  . Tick bite 01/22/2017  . Pain in limb 10/21/2011  . Swelling of limb 10/21/2011   Current Outpatient Medications on File Prior to Visit  Medication Sig Dispense Refill  . albuterol (PROVENTIL) (2.5 MG/3ML) 0.083% nebulizer solution Take 3 mLs (2.5 mg total) by nebulization every 4 (four) hours as needed for wheezing or shortness of breath. 75 mL 12  . Amino Acids-Protein Hydrolys (FEEDING SUPPLEMENT, PRO-STAT SUGAR FREE 64,) LIQD Take 30 mLs by mouth 2 (two) times daily. 900 mL 0  . aspirin EC 81 MG EC tablet Take 1 tablet (81 mg total) by mouth daily.    Marland Kitchen atorvastatin (LIPITOR) 10 MG tablet Take 10 mg by mouth daily.    Marland Kitchen atorvastatin (LIPITOR) 10  MG tablet Take 10 mg by mouth daily at 6 PM.    . bisacodyl (DULCOLAX) 10 MG suppository Place 1 suppository (10 mg total) rectally daily as needed for moderate constipation. 12 suppository 0  . ciprofloxacin (CIPRO) 500 MG tablet Take 1 tablet (500 mg total) by mouth 2 (two) times daily. 20 tablet 0  . clindamycin (CLEOCIN) 300 MG capsule     . clopidogrel (PLAVIX) 75 MG tablet Take 75 mg by mouth daily.    . collagenase (SANTYL) ointment Apply topically daily. 15 g 0  . feeding supplement, GLUCERNA SHAKE, (GLUCERNA SHAKE) LIQD Take 237 mLs by mouth 3 (three) times daily between meals.  0  . gabapentin (NEURONTIN) 300 MG capsule Take 300 mg by mouth 3 (three) times daily.    Marland Kitchen gabapentin (NEURONTIN) 300 MG capsule Take 600 mg by mouth 3 (three) times daily.    Marland Kitchen glimepiride (AMARYL) 2 MG tablet Take 2 mg by mouth daily with breakfast.    . glimepiride (AMARYL) 2 MG tablet Take 2 mg by mouth daily with breakfast.    . HEPARIN LOCK FLUSH IV Inject into the vein.    Marland Kitchen HYDROcodone-acetaminophen (NORCO) 10-325 MG tablet Take 1 tablet by mouth every 6 (six) hours as needed for moderate pain.    Marland Kitchen lisinopril (PRINIVIL,ZESTRIL) 10 MG tablet Take 10 mg by mouth daily.    Marland Kitchen lisinopril (PRINIVIL,ZESTRIL) 10 MG tablet Take 10 mg by mouth daily.    Marland Kitchen lisinopril-hydrochlorothiazide (ZESTORETIC) 20-25 MG tablet     .  Multiple Vitamin (MULTIVITAMIN WITH MINERALS) TABS tablet Take 1 tablet by mouth daily.    . multivitamin-iron-minerals-folic acid (CENTRUM) chewable tablet Chew 1 tablet by mouth daily.    . nicotine (NICODERM CQ - DOSED IN MG/24 HOURS) 14 mg/24hr patch Place 1 patch (14 mg total) onto the skin daily. 28 patch 0  . omega-3 acid ethyl esters (LOVAZA) 1 g capsule Take 1 g by mouth daily.    . Omega-3 Fatty Acids (FISH OIL PO) Take 1 tablet by mouth daily.    Marland Kitchen oxyCODONE-acetaminophen (PERCOCET) 10-325 MG tablet Take 1 tablet by mouth every 6 (six) hours as needed for pain. 40 tablet 0  .  polyethylene glycol (MIRALAX / GLYCOLAX) packet Take 17 g by mouth daily as needed for mild constipation.    . terbinafine (LAMISIL) 250 MG tablet Take 250 mg by mouth daily.     No current facility-administered medications on file prior to visit.   No Known Allergies  No results found for this or any previous visit (from the past 2160 hour(s)).  Objective: There were no vitals filed for this visit.  General: Patient is awake, alert, oriented in no acute distress  Dermatology: Skin is warm and dry bilateral with a partial thickness ulceration present at Right amputation stump. Ulceration plantar stump that measures post debridement measure 0.9x 0.6x 0.2 cm predebridement and post debridement measure 1.5 x 1x 0.2 cm with a granular base with increased macerated and keratotic margins. The ulceration does not probe to bone.  There is no malodor, no active drainage, no erythema, trace edema to stump on right.  No other acute signs of infection.  Vascular: Dorsalis Pedis pulse = 1/4 Bilateral,  Posterior Tibial pulse = 0/4 Bilateral,  Capillary Fill Time < 5 seconds, 1+ pitting edema.  Neurologic: Protective sensation absent bilateral.    Musculosketal: No pain with palpation to ulcerated area.  Status post right foot TMA.  No pain with compression to calves bilateral.  No results for input(s): GRAMSTAIN, LABORGA in the last 8760 hours.  Assessment and Plan:  Problem List Items Addressed This Visit      Cardiovascular and Mediastinum   PAD (peripheral artery disease) ([place])     Endocrine   Diabetic polyneuropathy associated with type 2 diabetes mellitus ([place])     Other   History of transmetatarsal amputation of right foot ([place])    Other Visit Diagnoses    Skin ulcer of right foot, limited to breakdown of skin ([place])    -  Primary     -Re-examined patient and discussed the progression of the wound and treatment alternatives. -Excisionally dedbrided ulceration at right amputation  stump to healthy bleeding borders removing nonviable tissue using a sterile chisel blade. Wound measures post debridement as above. Wound was debrided to the level of the dermis with viable wound base exposed to promote healing. Hemostasis was achieved with manuel pressure.  Minimal bleeding noted.  Patient tolerated procedure well without any discomfort or anesthesia necessary for this wound debridement. -Due to patient having to possibly go next week for ABI testing did not applied Unna boot. -Applied protective gauze dressing and Betadine plantarly, gauze, and Coban.  Advised patient to keep clean dry and intact for the week or until he goes for ABI testing -Continue with offloading padding in shoe - Advised patient to go to the ER or return to office if the wound worsens or if constitutional symptoms are present. -Patient to return to office in 1 week for  follow up wound care/NuShield application.   Landis Martins, DPM

## 2019-12-13 ENCOUNTER — Telehealth: Payer: Self-pay | Admitting: *Deleted

## 2019-12-13 DIAGNOSIS — Z89431 Acquired absence of right foot: Secondary | ICD-10-CM

## 2019-12-13 DIAGNOSIS — L97511 Non-pressure chronic ulcer of other part of right foot limited to breakdown of skin: Secondary | ICD-10-CM

## 2019-12-13 DIAGNOSIS — I739 Peripheral vascular disease, unspecified: Secondary | ICD-10-CM

## 2019-12-13 DIAGNOSIS — E1142 Type 2 diabetes mellitus with diabetic polyneuropathy: Secondary | ICD-10-CM

## 2019-12-13 NOTE — Telephone Encounter (Signed)
[facility] scheduled [phone] ABI with TBI on 12/21/2019 arrive 9:30am for 10:00am testing. Faxed orders to [facility]. I informed pt of 12/21/2019 [facility] appt.

## 2019-12-13 NOTE — Telephone Encounter (Signed)
-----   Message from Landis Martins, [place] sent at 12/10/2019 11:48 AM EDT ----- Regarding: ABI testing ABI vascular testing Chronic nonhealing right foot stump ulceration History of stenting

## 2019-12-17 ENCOUNTER — Ambulatory Visit (INDEPENDENT_AMBULATORY_CARE_PROVIDER_SITE_OTHER): Payer: BC Managed Care – PPO | Admitting: Sports Medicine

## 2019-12-17 ENCOUNTER — Encounter: Payer: Self-pay | Admitting: Sports Medicine

## 2019-12-17 ENCOUNTER — Other Ambulatory Visit: Payer: Self-pay

## 2019-12-17 DIAGNOSIS — I739 Peripheral vascular disease, unspecified: Secondary | ICD-10-CM

## 2019-12-17 DIAGNOSIS — E1142 Type 2 diabetes mellitus with diabetic polyneuropathy: Secondary | ICD-10-CM | POA: Diagnosis not present

## 2019-12-17 DIAGNOSIS — L97511 Non-pressure chronic ulcer of other part of right foot limited to breakdown of skin: Secondary | ICD-10-CM

## 2019-12-17 DIAGNOSIS — Z89431 Acquired absence of right foot: Secondary | ICD-10-CM

## 2019-12-17 NOTE — Progress Notes (Signed)
Subjective: Adrian Bean is a 69 y.o. male patient seen in office for follow up evaluation of ulceration of the right foot amputation stump.  Reports that he has been able to keep dressing intact, denies nausea, vomiting, fever, chills, or any other constitutional symptoms at this time.  Fasting blood sugar does not check as previously noted  Admits to a history of to smoking still and has not quit.   Patient Active Problem List   Diagnosis Date Noted  . Infected surgical wound 06/18/2018  . Peripheral polyneuropathy 06/18/2018  . Mixed dyslipidemia 06/17/2018  . Essential hypertension 12/12/2017  . Need for hepatitis C screening test 12/12/2017  . Screening for colon cancer 12/12/2017  . Diabetic polyneuropathy associated with type 2 diabetes mellitus ([place]) 09/17/2017  . 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 History of transmetatarsal amputation of right foot ([place]) 07/04/2017  . PAD (peripheral artery disease) ([place]) 06/02/2017  . Sepsis ([place]) 06/02/2017  . DM (diabetes mellitus), type 2 ([place]) 06/02/2017  . Tobacco abuse 06/02/2017  . Tick bite 01/22/2017  . Pain in limb 10/21/2011  . Swelling of limb 10/21/2011   Current Outpatient Medications on File Prior to Visit  Medication Sig Dispense Refill  . albuterol (PROVENTIL) (2.5 MG/3ML) 0.083% nebulizer solution Take 3 mLs (2.5 mg total) by nebulization every 4 (four) hours as needed for wheezing or shortness of breath. 75 mL 12  . Amino Acids-Protein Hydrolys (FEEDING SUPPLEMENT, PRO-STAT SUGAR FREE 64,) LIQD Take 30 mLs by mouth 2 (two) times daily. 900 mL 0  . aspirin EC 81 MG EC tablet Take 1 tablet (81 mg total) by mouth daily.    Marland Kitchen atorvastatin (LIPITOR) 10 MG tablet Take 10 mg by mouth daily.    Marland Kitchen atorvastatin (LIPITOR) 10 MG tablet Take 10 mg by mouth daily at 6 PM.    . bisacodyl (DULCOLAX) 10 MG suppository Place 1 suppository (10 mg total) rectally daily as  needed for moderate constipation. 12 suppository 0  . ciprofloxacin (CIPRO) 500 MG tablet Take 1 tablet (500 mg total) by mouth 2 (two) times daily. 20 tablet 0  . clindamycin (CLEOCIN) 300 MG capsule     . clopidogrel (PLAVIX) 75 MG tablet Take 75 mg by mouth daily.    . collagenase (SANTYL) ointment Apply topically daily. 15 g 0  . feeding supplement, GLUCERNA SHAKE, (GLUCERNA SHAKE) LIQD Take 237 mLs by mouth 3 (three) times daily between meals.  0  . gabapentin (NEURONTIN) 300 MG capsule Take 300 mg by mouth 3 (three) times daily.    Marland Kitchen gabapentin (NEURONTIN) 300 MG capsule Take 600 mg by mouth 3 (three) times daily.    Marland Kitchen glimepiride (AMARYL) 2 MG tablet Take 2 mg by mouth daily with breakfast.    . glimepiride (AMARYL) 2 MG tablet Take 2 mg by mouth daily with breakfast.    . HEPARIN LOCK FLUSH IV Inject into the vein.    Marland Kitchen HYDROcodone-acetaminophen (NORCO) 10-325 MG tablet Take 1 tablet by mouth every 6 (six) hours as needed for moderate pain.    Marland Kitchen lisinopril (PRINIVIL,ZESTRIL) 10 MG tablet Take 10 mg by mouth daily.    Marland Kitchen lisinopril (PRINIVIL,ZESTRIL) 10 MG tablet Take 10 mg by mouth daily.    Marland Kitchen lisinopril-hydrochlorothiazide (ZESTORETIC) 20-25 MG tablet     . Multiple Vitamin (MULTIVITAMIN WITH MINERALS) TABS tablet Take 1 tablet by mouth daily.    . multivitamin-iron-minerals-folic acid (CENTRUM) chewable tablet Chew 1 tablet by mouth  daily.    . nicotine (NICODERM CQ - DOSED IN MG/24 HOURS) 14 mg/24hr patch Place 1 patch (14 mg total) onto the skin daily. 28 patch 0  . omega-3 acid ethyl esters (LOVAZA) 1 g capsule Take 1 g by mouth daily.    . Omega-3 Fatty Acids (FISH OIL PO) Take 1 tablet by mouth daily.    Marland Kitchen oxyCODONE-acetaminophen (PERCOCET) 10-325 MG tablet Take 1 tablet by mouth every 6 (six) hours as needed for pain. 40 tablet 0  . polyethylene glycol (MIRALAX / GLYCOLAX) packet Take 17 g by mouth daily as needed for mild constipation.    . terbinafine (LAMISIL) 250 MG tablet  Take 250 mg by mouth daily.     No current facility-administered medications on file prior to visit.   No Known Allergies  No results found for this or any previous visit (from the past 2160 hour(s)).  Objective: There were no vitals filed for this visit.  General: Patient is awake, alert, oriented in no acute distress  Dermatology: Skin is warm and dry bilateral with a partial thickness ulceration present at Right amputation stump. Ulceration plantar stump that measures post debridement measure 0.9x 0.6x 0.2 cm predebridement and post debridement measure 1x 0.7x0.2 cm with a granular base with increased macerated and keratotic margins. The ulceration does not probe to bone.  There is no malodor, no active drainage, no erythema, trace edema to stump on right.  No other acute signs of infection.  Vascular: Dorsalis Pedis pulse = 1/4 Bilateral,  Posterior Tibial pulse = 0/4 Bilateral,  Capillary Fill Time < 5 seconds, 1+ pitting edema.  Neurologic: Protective sensation absent bilateral.    Musculosketal: No pain with palpation to ulcerated area.  Status post right foot TMA.  No pain with compression to calves bilateral.  No results for input(s): GRAMSTAIN, LABORGA in the last 8760 hours.  Assessment and Plan:  Problem List Items Addressed This Visit      Cardiovascular and Mediastinum   PAD (peripheral artery disease) ([place])     Endocrine   Diabetic polyneuropathy associated with type 2 diabetes mellitus ([place])     Other   History of transmetatarsal amputation of right foot ([place])    Other Visit Diagnoses    Skin ulcer of right foot, limited to breakdown of skin ([place])    -  Primary     -Re-examined patient and discussed the progression of the wound and treatment alternatives. -Excisionally dedbrided ulceration at right amputation stump to healthy bleeding borders removing nonviable tissue using a sterile chisel blade. Wound measures post debridement as above. Wound was debrided to  the level of the dermis with viable wound base exposed to promote healing. Hemostasis was achieved with manuel pressure.  Minimal bleeding noted.  Patient tolerated procedure well without any discomfort or anesthesia necessary for this wound debridement. -Applied protective gauze dressing and Betadine plantarly, gauze, and Coban.  Advised patient to keep clean dry and intact for the week or until he goes for ABI testing; Has appt on 6/22  -Continue with offloading padding in shoe like previous - Advised patient to go to the ER or return to office if the wound worsens or if constitutional symptoms are present. -Patient to return to office in 1 week for follow up wound care  Landis Martins, DPM

## 2019-12-23 ENCOUNTER — Encounter: Payer: Self-pay | Admitting: Sports Medicine

## 2019-12-24 ENCOUNTER — Ambulatory Visit (INDEPENDENT_AMBULATORY_CARE_PROVIDER_SITE_OTHER): Payer: BC Managed Care – PPO | Admitting: Sports Medicine

## 2019-12-24 ENCOUNTER — Other Ambulatory Visit: Payer: Self-pay

## 2019-12-24 ENCOUNTER — Encounter: Payer: Self-pay | Admitting: Sports Medicine

## 2019-12-24 DIAGNOSIS — I739 Peripheral vascular disease, unspecified: Secondary | ICD-10-CM

## 2019-12-24 DIAGNOSIS — Z89431 Acquired absence of right foot: Secondary | ICD-10-CM | POA: Diagnosis not present

## 2019-12-24 DIAGNOSIS — E1142 Type 2 diabetes mellitus with diabetic polyneuropathy: Secondary | ICD-10-CM

## 2019-12-24 DIAGNOSIS — L97511 Non-pressure chronic ulcer of other part of right foot limited to breakdown of skin: Secondary | ICD-10-CM | POA: Diagnosis not present

## 2019-12-24 NOTE — Progress Notes (Signed)
Subjective: Adrian Bean is a 69 y.o. male patient seen in office for follow up evaluation of ulceration of the right foot amputation stump.  Reports that he has been able to keep dressing intact and went for his vascular testing which costs him $200 denies nausea, vomiting, fever, chills, or any other constitutional symptoms at this time.  Fasting blood sugar does not check as previously noted.   Patient Active Problem List   Diagnosis Date Noted  . Infected surgical wound 06/18/2018  . Peripheral polyneuropathy 06/18/2018  . Mixed dyslipidemia 06/17/2018  . Essential hypertension 12/12/2017  . Need for hepatitis C screening test 12/12/2017  . Screening for colon cancer 12/12/2017  . Diabetic polyneuropathy associated with type 2 diabetes mellitus ([place]) 09/17/2017  . 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 History of transmetatarsal amputation of right foot ([place]) 07/04/2017  . PAD (peripheral artery disease) ([place]) 06/02/2017  . Sepsis ([place]) 06/02/2017  . DM (diabetes mellitus), type 2 ([place]) 06/02/2017  . Tobacco abuse 06/02/2017  . Tick bite 01/22/2017  . Pain in limb 10/21/2011  . Swelling of limb 10/21/2011   Current Outpatient Medications on File Prior to Visit  Medication Sig Dispense Refill  . albuterol (PROVENTIL) (2.5 MG/3ML) 0.083% nebulizer solution Take 3 mLs (2.5 mg total) by nebulization every 4 (four) hours as needed for wheezing or shortness of breath. 75 mL 12  . Amino Acids-Protein Hydrolys (FEEDING SUPPLEMENT, PRO-STAT SUGAR FREE 64,) LIQD Take 30 mLs by mouth 2 (two) times daily. 900 mL 0  . aspirin EC 81 MG EC tablet Take 1 tablet (81 mg total) by mouth daily.    Marland Kitchen atorvastatin (LIPITOR) 10 MG tablet Take 10 mg by mouth daily.    Marland Kitchen atorvastatin (LIPITOR) 10 MG tablet Take 10 mg by mouth daily at 6 PM.    . bisacodyl (DULCOLAX) 10 MG suppository Place 1 suppository (10 mg total) rectally daily as needed for  moderate constipation. 12 suppository 0  . ciprofloxacin (CIPRO) 500 MG tablet Take 1 tablet (500 mg total) by mouth 2 (two) times daily. 20 tablet 0  . clindamycin (CLEOCIN) 300 MG capsule     . clopidogrel (PLAVIX) 75 MG tablet Take 75 mg by mouth daily.    . collagenase (SANTYL) ointment Apply topically daily. 15 g 0  . feeding supplement, GLUCERNA SHAKE, (GLUCERNA SHAKE) LIQD Take 237 mLs by mouth 3 (three) times daily between meals.  0  . gabapentin (NEURONTIN) 300 MG capsule Take 300 mg by mouth 3 (three) times daily.    Marland Kitchen gabapentin (NEURONTIN) 300 MG capsule Take 600 mg by mouth 3 (three) times daily.    Marland Kitchen glimepiride (AMARYL) 2 MG tablet Take 2 mg by mouth daily with breakfast.    . glimepiride (AMARYL) 2 MG tablet Take 2 mg by mouth daily with breakfast.    . HEPARIN LOCK FLUSH IV Inject into the vein.    Marland Kitchen HYDROcodone-acetaminophen (NORCO) 10-325 MG tablet Take 1 tablet by mouth every 6 (six) hours as needed for moderate pain.    Marland Kitchen lisinopril (PRINIVIL,ZESTRIL) 10 MG tablet Take 10 mg by mouth daily.    Marland Kitchen lisinopril (PRINIVIL,ZESTRIL) 10 MG tablet Take 10 mg by mouth daily.    Marland Kitchen lisinopril-hydrochlorothiazide (ZESTORETIC) 20-25 MG tablet     . Multiple Vitamin (MULTIVITAMIN WITH MINERALS) TABS tablet Take 1 tablet by mouth daily.    . multivitamin-iron-minerals-folic acid (CENTRUM) chewable tablet Chew 1 tablet by mouth daily.    Marland Kitchen  nicotine (NICODERM CQ - DOSED IN MG/24 HOURS) 14 mg/24hr patch Place 1 patch (14 mg total) onto the skin daily. 28 patch 0  . omega-3 acid ethyl esters (LOVAZA) 1 g capsule Take 1 g by mouth daily.    . Omega-3 Fatty Acids (FISH OIL PO) Take 1 tablet by mouth daily.    Marland Kitchen oxyCODONE-acetaminophen (PERCOCET) 10-325 MG tablet Take 1 tablet by mouth every 6 (six) hours as needed for pain. 40 tablet 0  . polyethylene glycol (MIRALAX / GLYCOLAX) packet Take 17 g by mouth daily as needed for mild constipation.    . terbinafine (LAMISIL) 250 MG tablet Take 250 mg  by mouth daily.     No current facility-administered medications on file prior to visit.   No Known Allergies  No results found for this or any previous visit (from the past 2160 hour(s)).  Objective: There were no vitals filed for this visit.  General: Patient is awake, alert, oriented in no acute distress  Dermatology: Skin is warm and dry bilateral with a partial thickness ulceration present at Right amputation stump. Ulceration plantar stump that measures pre debridement measures 0.9x 0.3x 0.2 cm and post debridement measures 1x 0.3x0.2 cm with a granular base with increased macerated and keratotic margins. The ulceration does not probe to bone.  There is no malodor, no active drainage, no erythema, trace edema to stump on right.  No other acute signs of infection.  Vascular: Dorsalis Pedis pulse = 1/4 Bilateral,  Posterior Tibial pulse = 0/4 Bilateral,  Capillary Fill Time < 5 seconds, 1+ pitting edema.  Neurologic: Protective sensation absent bilateral.    Musculosketal: No pain with palpation to ulcerated area.  Status post right foot TMA.  No pain with compression to calves bilateral.  No results for input(s): GRAMSTAIN, LABORGA in the last 8760 hours.  Assessment and Plan:  Problem List Items Addressed This Visit      Cardiovascular and Mediastinum   PAD (peripheral artery disease) ([place])     Endocrine   Diabetic polyneuropathy associated with type 2 diabetes mellitus ([place])     Other   History of transmetatarsal amputation of right foot ([place])    Other Visit Diagnoses    Skin ulcer of right foot, limited to breakdown of skin ([place])    -  Primary     -Re-examined patient and discussed the progression of the wound and treatment alternatives. -ABIs normal  -Excisionally dedbrided ulceration at right amputation stump to healthy bleeding borders removing nonviable tissue using a sterile chisel blade. Wound measures post debridement as above. Wound was debrided to the level  of the dermis with viable wound base exposed to promote healing. Hemostasis was achieved with manuel pressure.  Minimal bleeding noted.  Patient tolerated procedure well without any discomfort or anesthesia necessary for this wound debridement. -Applied Kerracel AG and dry dressing and advised patient to leave intact until next week  -Continue with offloading padding in shoe like previous; new pad applied - Advised patient to go to the ER or return to office if the wound worsens or if constitutional symptoms are present. -Patient to return to office in 1 week for follow up wound care  Landis Martins, DPM

## 2019-12-31 ENCOUNTER — Encounter: Payer: Self-pay | Admitting: Sports Medicine

## 2019-12-31 ENCOUNTER — Ambulatory Visit (INDEPENDENT_AMBULATORY_CARE_PROVIDER_SITE_OTHER): Payer: BC Managed Care – PPO | Admitting: Sports Medicine

## 2019-12-31 ENCOUNTER — Other Ambulatory Visit: Payer: Self-pay

## 2019-12-31 DIAGNOSIS — L97511 Non-pressure chronic ulcer of other part of right foot limited to breakdown of skin: Secondary | ICD-10-CM | POA: Diagnosis not present

## 2019-12-31 DIAGNOSIS — Z89431 Acquired absence of right foot: Secondary | ICD-10-CM | POA: Diagnosis not present

## 2019-12-31 DIAGNOSIS — E1142 Type 2 diabetes mellitus with diabetic polyneuropathy: Secondary | ICD-10-CM | POA: Diagnosis not present

## 2019-12-31 DIAGNOSIS — I739 Peripheral vascular disease, unspecified: Secondary | ICD-10-CM | POA: Diagnosis not present

## 2019-12-31 NOTE — Progress Notes (Signed)
Subjective: Adrian Bean is a 69 y.o. male patient seen in office for follow up evaluation of ulceration of the right foot amputation stump.  Reports that he has been able to keep dressing intact and has not had any issues except a little increase in pain from his neuropathy, denies nausea, vomiting, fever, chills, or any other constitutional symptoms at this time.  Fasting blood sugar does not check    Patient Active Problem List   Diagnosis Date Noted  . Infected surgical wound 06/18/2018  . Peripheral polyneuropathy 06/18/2018  . Mixed dyslipidemia 06/17/2018  . Essential hypertension 12/12/2017  . Need for hepatitis C screening test 12/12/2017  . Screening for colon cancer 12/12/2017  . Diabetic polyneuropathy associated with type 2 diabetes mellitus ([place]) 09/17/2017  . 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 History of transmetatarsal amputation of right foot ([place]) 07/04/2017  . PAD (peripheral artery disease) ([place]) 06/02/2017  . Sepsis ([place]) 06/02/2017  . DM (diabetes mellitus), type 2 ([place]) 06/02/2017  . Tobacco abuse 06/02/2017  . Tick bite 01/22/2017  . Pain in limb 10/21/2011  . Swelling of limb 10/21/2011   Current Outpatient Medications on File Prior to Visit  Medication Sig Dispense Refill  . albuterol (PROVENTIL) (2.5 MG/3ML) 0.083% nebulizer solution Take 3 mLs (2.5 mg total) by nebulization every 4 (four) hours as needed for wheezing or shortness of breath. 75 mL 12  . Amino Acids-Protein Hydrolys (FEEDING SUPPLEMENT, PRO-STAT SUGAR FREE 64,) LIQD Take 30 mLs by mouth 2 (two) times daily. 900 mL 0  . aspirin EC 81 MG EC tablet Take 1 tablet (81 mg total) by mouth daily.    Marland Kitchen atorvastatin (LIPITOR) 10 MG tablet Take 10 mg by mouth daily.    Marland Kitchen atorvastatin (LIPITOR) 10 MG tablet Take 10 mg by mouth daily at 6 PM.    . bisacodyl (DULCOLAX) 10 MG suppository Place 1 suppository (10 mg total) rectally daily as  needed for moderate constipation. 12 suppository 0  . ciprofloxacin (CIPRO) 500 MG tablet Take 1 tablet (500 mg total) by mouth 2 (two) times daily. 20 tablet 0  . clindamycin (CLEOCIN) 300 MG capsule     . clopidogrel (PLAVIX) 75 MG tablet Take 75 mg by mouth daily.    . collagenase (SANTYL) ointment Apply topically daily. 15 g 0  . feeding supplement, GLUCERNA SHAKE, (GLUCERNA SHAKE) LIQD Take 237 mLs by mouth 3 (three) times daily between meals.  0  . gabapentin (NEURONTIN) 300 MG capsule Take 300 mg by mouth 3 (three) times daily.    Marland Kitchen gabapentin (NEURONTIN) 300 MG capsule Take 600 mg by mouth 3 (three) times daily.    Marland Kitchen glimepiride (AMARYL) 2 MG tablet Take 2 mg by mouth daily with breakfast.    . glimepiride (AMARYL) 2 MG tablet Take 2 mg by mouth daily with breakfast.    . HEPARIN LOCK FLUSH IV Inject into the vein.    Marland Kitchen HYDROcodone-acetaminophen (NORCO) 10-325 MG tablet Take 1 tablet by mouth every 6 (six) hours as needed for moderate pain.    Marland Kitchen lisinopril (PRINIVIL,ZESTRIL) 10 MG tablet Take 10 mg by mouth daily.    Marland Kitchen lisinopril (PRINIVIL,ZESTRIL) 10 MG tablet Take 10 mg by mouth daily.    Marland Kitchen lisinopril-hydrochlorothiazide (ZESTORETIC) 20-25 MG tablet     . Multiple Vitamin (MULTIVITAMIN WITH MINERALS) TABS tablet Take 1 tablet by mouth daily.    . multivitamin-iron-minerals-folic acid (CENTRUM) chewable tablet Chew 1 tablet by mouth  daily.    . nicotine (NICODERM CQ - DOSED IN MG/24 HOURS) 14 mg/24hr patch Place 1 patch (14 mg total) onto the skin daily. 28 patch 0  . omega-3 acid ethyl esters (LOVAZA) 1 g capsule Take 1 g by mouth daily.    . Omega-3 Fatty Acids (FISH OIL PO) Take 1 tablet by mouth daily.    Marland Kitchen oxyCODONE-acetaminophen (PERCOCET) 10-325 MG tablet Take 1 tablet by mouth every 6 (six) hours as needed for pain. 40 tablet 0  . polyethylene glycol (MIRALAX / GLYCOLAX) packet Take 17 g by mouth daily as needed for mild constipation.    . terbinafine (LAMISIL) 250 MG tablet  Take 250 mg by mouth daily.     No current facility-administered medications on file prior to visit.   No Known Allergies  No results found for this or any previous visit (from the past 2160 hour(s)).  Objective: There were no vitals filed for this visit.  General: Patient is awake, alert, oriented in no acute distress  Dermatology: Skin is warm and dry bilateral with a partial thickness ulceration present at Right amputation stump. Ulceration plantar stump that measures pre debridement measures 0.9x 0.8x 0.2 cm and post debridement measures 1x 1.5 x0.2 cm with a granular base with minimal macerated and keratotic margins. The ulceration does not probe to bone.  There is no malodor, no active drainage, no erythema, trace edema to stump on right.  No other acute signs of infection.  Vascular: Dorsalis Pedis pulse = 1/4 Bilateral,  Posterior Tibial pulse = 0/4 Bilateral,  Capillary Fill Time < 5 seconds, 1+ pitting edema.  Neurologic: Protective sensation absent bilateral.    Musculosketal: No pain with palpation to ulcerated area.  Status post right foot TMA.  No pain with compression to calves bilateral.  No results for input(s): GRAMSTAIN, LABORGA in the last 8760 hours.  Assessment and Plan:  Problem List Items Addressed This Visit      Cardiovascular and Mediastinum   PAD (peripheral artery disease) ([place])     Endocrine   Diabetic polyneuropathy associated with type 2 diabetes mellitus ([place])     Other   History of transmetatarsal amputation of right foot ([place])    Other Visit Diagnoses    Skin ulcer of right foot, limited to breakdown of skin ([place])    -  Primary     -Re-examined patient and discussed the progression of the wound and treatment alternatives. -Excisionally dedbrided ulceration at right amputation stump to healthy bleeding borders removing nonviable tissue using a sterile chisel blade. Wound measures post debridement as above. Wound was debrided to the level of  the dermis with viable wound base exposed to promote healing. Hemostasis was achieved with manuel pressure.  Minimal bleeding noted.  Patient tolerated procedure well without any discomfort or anesthesia necessary for this wound debridement. -Applied offloading padding, Kerracel AG, and dry dressing and Coban to the right foot.  Advised patient to leave intact until next week  -Continue with offloading padding in shoe as well - Advised patient to go to the ER or return to office if the wound worsens or if constitutional symptoms are present. -Patient to return to office next week with Dr. March Rummage for dressing change for follow up wound care  Landis Martins, DPM

## 2020-01-04 ENCOUNTER — Other Ambulatory Visit: Payer: Self-pay

## 2020-01-04 ENCOUNTER — Ambulatory Visit (INDEPENDENT_AMBULATORY_CARE_PROVIDER_SITE_OTHER): Payer: BC Managed Care – PPO | Admitting: Podiatry

## 2020-01-04 DIAGNOSIS — L97511 Non-pressure chronic ulcer of other part of right foot limited to breakdown of skin: Secondary | ICD-10-CM | POA: Diagnosis not present

## 2020-01-04 NOTE — Progress Notes (Signed)
°  Subjective:  Patient ID: Adrian Bean, male    DOB: 1950/09/08,  MRN: [id number]  No chief complaint on file.   69 y.o. male presents for wound care. Hx confirmed with patient.  States that the wound is the same as it was a few days ago.  Denies interval changes.  Wearing his shoe that has an offloading pad placed in side it. Objective:  Physical Exam: Wound Location: distal right central midfoot Wound Measurement: 1.5x1.3 post-debridement Wound Base: Mixed Granular/Fibrotic Peri-wound: Calloused Exudate: Scant/small amount Serosanguinous exudate wound without warmth, erythema, signs of acute infection  Assessment:   1. Skin ulcer of right foot, limited to breakdown of skin [facility])      Plan:  Patient was evaluated and treated and all questions answered.  Ulcer right foot -Per previous measurement wound somewhat similar in size -Wound cleansed and debrided -Dressing applied consisting of Kerasal AG, 4 x 4 Kerlix Coban  Procedure: Excisional Debridement of Wound Indication: Removal of non-viable soft tissue from the wound to promote healing.  Anesthesia: none Pre-Debridement Wound Measurements: 1 cm x 1.2 cm x 0.2 cm  Post-Debridement Wound Measurements: 1.5 cm x 1.3 cm x 0.2 cm  Type of Debridement: Sharp Excisional Tissue Removed: Non-viable soft tissue Instrumentation: 15 blade and tissue nipper Depth of Debridement: subcutaneous tissue. Technique: Sharp excisional debridement to bleeding, viable wound base.  Dressing: Dry, sterile, compression dressing. Disposition: Patient tolerated procedure well. Patient to return in 1 week for follow-up.    No follow-ups on file.

## 2020-01-14 ENCOUNTER — Other Ambulatory Visit: Payer: Self-pay

## 2020-01-14 ENCOUNTER — Ambulatory Visit (INDEPENDENT_AMBULATORY_CARE_PROVIDER_SITE_OTHER): Payer: BC Managed Care – PPO | Admitting: Sports Medicine

## 2020-01-14 DIAGNOSIS — L97511 Non-pressure chronic ulcer of other part of right foot limited to breakdown of skin: Secondary | ICD-10-CM | POA: Diagnosis not present

## 2020-01-14 DIAGNOSIS — E1142 Type 2 diabetes mellitus with diabetic polyneuropathy: Secondary | ICD-10-CM

## 2020-01-14 DIAGNOSIS — Z89431 Acquired absence of right foot: Secondary | ICD-10-CM

## 2020-01-14 NOTE — Progress Notes (Signed)
Subjective: Adrian Bean is a 69 y.o. male patient seen in office for follow up evaluation of ulceration of the right foot amputation stump.  Reports that he had an episode where he fainted on Wednesday.  Patient is unsure the reason why and does not know if his blood sugar dropped very low that caused him to faint.  Patient denies nausea vomiting fever chills or any other constitutional symptoms at this time.  Fasting blood sugar does not check    Patient Active Problem List   Diagnosis Date Noted  . Infected surgical wound 06/18/2018  . Peripheral polyneuropathy 06/18/2018  . Mixed dyslipidemia 06/17/2018  . Essential hypertension 12/12/2017  . Need for hepatitis C screening test 12/12/2017  . Screening for colon cancer 12/12/2017  . Diabetic polyneuropathy associated with type 2 diabetes mellitus ([place]) 09/17/2017  . 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 History of transmetatarsal amputation of right foot ([place]) 07/04/2017  . PAD (peripheral artery disease) ([place]) 06/02/2017  . Sepsis ([place]) 06/02/2017  . DM (diabetes mellitus), type 2 ([place]) 06/02/2017  . Tobacco abuse 06/02/2017  . Tick bite 01/22/2017  . Pain in limb 10/21/2011  . Swelling of limb 10/21/2011   Current Outpatient Medications on File Prior to Visit  Medication Sig Dispense Refill  . albuterol (PROVENTIL) (2.5 MG/3ML) 0.083% nebulizer solution Take 3 mLs (2.5 mg total) by nebulization every 4 (four) hours as needed for wheezing or shortness of breath. 75 mL 12  . Amino Acids-Protein Hydrolys (FEEDING SUPPLEMENT, PRO-STAT SUGAR FREE 64,) LIQD Take 30 mLs by mouth 2 (two) times daily. 900 mL 0  . aspirin EC 81 MG EC tablet Take 1 tablet (81 mg total) by mouth daily.    Marland Kitchen atorvastatin (LIPITOR) 10 MG tablet Take 10 mg by mouth daily.    Marland Kitchen atorvastatin (LIPITOR) 10 MG tablet Take 10 mg by mouth daily at 6 PM.    . bisacodyl (DULCOLAX) 10 MG suppository Place 1  suppository (10 mg total) rectally daily as needed for moderate constipation. 12 suppository 0  . ciprofloxacin (CIPRO) 500 MG tablet Take 1 tablet (500 mg total) by mouth 2 (two) times daily. 20 tablet 0  . clindamycin (CLEOCIN) 300 MG capsule     . clopidogrel (PLAVIX) 75 MG tablet Take 75 mg by mouth daily.    . collagenase (SANTYL) ointment Apply topically daily. 15 g 0  . feeding supplement, GLUCERNA SHAKE, (GLUCERNA SHAKE) LIQD Take 237 mLs by mouth 3 (three) times daily between meals.  0  . gabapentin (NEURONTIN) 300 MG capsule Take 300 mg by mouth 3 (three) times daily.    Marland Kitchen gabapentin (NEURONTIN) 300 MG capsule Take 600 mg by mouth 3 (three) times daily.    Marland Kitchen glimepiride (AMARYL) 2 MG tablet Take 2 mg by mouth daily with breakfast.    . glimepiride (AMARYL) 2 MG tablet Take 2 mg by mouth daily with breakfast.    . HEPARIN LOCK FLUSH IV Inject into the vein.    Marland Kitchen HYDROcodone-acetaminophen (NORCO) 10-325 MG tablet Take 1 tablet by mouth every 6 (six) hours as needed for moderate pain.    Marland Kitchen lisinopril (PRINIVIL,ZESTRIL) 10 MG tablet Take 10 mg by mouth daily.    Marland Kitchen lisinopril (PRINIVIL,ZESTRIL) 10 MG tablet Take 10 mg by mouth daily.    Marland Kitchen lisinopril-hydrochlorothiazide (ZESTORETIC) 20-25 MG tablet     . Multiple Vitamin (MULTIVITAMIN WITH MINERALS) TABS tablet Take 1 tablet by mouth daily.    Marland Kitchen  multivitamin-iron-minerals-folic acid (CENTRUM) chewable tablet Chew 1 tablet by mouth daily.    . nicotine (NICODERM CQ - DOSED IN MG/24 HOURS) 14 mg/24hr patch Place 1 patch (14 mg total) onto the skin daily. 28 patch 0  . omega-3 acid ethyl esters (LOVAZA) 1 g capsule Take 1 g by mouth daily.    . Omega-3 Fatty Acids (FISH OIL PO) Take 1 tablet by mouth daily.    Marland Kitchen oxyCODONE-acetaminophen (PERCOCET) 10-325 MG tablet Take 1 tablet by mouth every 6 (six) hours as needed for pain. 40 tablet 0  . polyethylene glycol (MIRALAX / GLYCOLAX) packet Take 17 g by mouth daily as needed for mild constipation.     . terbinafine (LAMISIL) 250 MG tablet Take 250 mg by mouth daily.     No current facility-administered medications on file prior to visit.   No Known Allergies  No results found for this or any previous visit (from the past 2160 hour(s)).  Objective: There were no vitals filed for this visit.  General: Patient is awake, alert, oriented in no acute distress  Dermatology: Skin is warm and dry bilateral with a partial thickness ulceration present at Right amputation stump. Ulceration plantar stump that measures pre debridement measures 0.9x 0.8x 0.2 cm and post debridement measures 0.9 x 1.0 x0.2 cm with a granular base with minimal macerated and keratotic margins. The ulceration does not probe to bone.  There is no malodor, no active drainage, no erythema, trace edema to stump on right.  No other acute signs of infection.  Vascular: Dorsalis Pedis pulse = 1/4 Bilateral,  Posterior Tibial pulse = 0/4 Bilateral,  Capillary Fill Time < 5 seconds, 1+ pitting edema.  Neurologic: Protective sensation absent bilateral.    Musculosketal: No pain with palpation to ulcerated area.  Status post right foot TMA.  No pain with compression to calves bilateral.  No results for input(s): GRAMSTAIN, LABORGA in the last 8760 hours.  Assessment and Plan:  Problem List Items Addressed This Visit      Endocrine   Diabetic polyneuropathy associated with type 2 diabetes mellitus ([place])     Other   History of transmetatarsal amputation of right foot ([place])    Other Visit Diagnoses    Skin ulcer of right foot, limited to breakdown of skin ([place])    -  Primary     -Re-examined patient and discussed the progression of the wound and treatment alternatives. -Excisionally dedbrided ulceration at right amputation stump to healthy bleeding borders removing nonviable tissue using a sterile chisel blade. Wound measures post debridement as above. Wound was debrided to the level of the dermis with viable wound base  exposed to promote healing. Hemostasis was achieved with manuel pressure.  Minimal bleeding noted.  Patient tolerated procedure well without any discomfort or anesthesia necessary for this wound debridement. -Applied offloading padding, actisorb, and dry dressing and Coban to the right foot.  Advised patient to leave intact until next week  -Continue with offloading padding in shoe as well - Advised patient to go to the ER or return to office if the wound worsens or if constitutional symptoms are present. -Encourage patient to follow-up with his PCP regarding fainting spells -Patient to return to office next week for follow-up wound care Landis Martins, DPM

## 2020-01-21 ENCOUNTER — Ambulatory Visit (INDEPENDENT_AMBULATORY_CARE_PROVIDER_SITE_OTHER): Payer: BC Managed Care – PPO | Admitting: Sports Medicine

## 2020-01-21 ENCOUNTER — Other Ambulatory Visit: Payer: Self-pay

## 2020-01-21 DIAGNOSIS — E1142 Type 2 diabetes mellitus with diabetic polyneuropathy: Secondary | ICD-10-CM

## 2020-01-21 DIAGNOSIS — Z89431 Acquired absence of right foot: Secondary | ICD-10-CM

## 2020-01-21 DIAGNOSIS — L97511 Non-pressure chronic ulcer of other part of right foot limited to breakdown of skin: Secondary | ICD-10-CM

## 2020-01-21 DIAGNOSIS — I739 Peripheral vascular disease, unspecified: Secondary | ICD-10-CM

## 2020-01-22 ENCOUNTER — Encounter: Payer: Self-pay | Admitting: Sports Medicine

## 2020-01-22 NOTE — Progress Notes (Signed)
Subjective: Adrian Bean is a 68 y.o. male patient seen in office for follow up evaluation of ulceration of the right foot amputation stump.  Reports that he is doing good and was able to keep dressing clean dry and intact with no drainage noted through the bandage.  Patient denies nausea vomiting fever chills or any other constitutional symptoms at this time.  Fasting blood sugar does not check as previously noted.   Patient Active Problem List   Diagnosis Date Noted  . Infected surgical wound 06/18/2018  . Peripheral polyneuropathy 06/18/2018  . Mixed dyslipidemia 06/17/2018  . Essential hypertension 12/12/2017  . Need for hepatitis C screening test 12/12/2017  . Screening for colon cancer 12/12/2017  . Diabetic polyneuropathy associated with type 2 diabetes mellitus ([place]) 09/17/2017  . 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 History of transmetatarsal amputation of right foot ([place]) 07/04/2017  . PAD (peripheral artery disease) ([place]) 06/02/2017  . Sepsis ([place]) 06/02/2017  . DM (diabetes mellitus), type 2 ([place]) 06/02/2017  . Tobacco abuse 06/02/2017  . Tick bite 01/22/2017  . Pain in limb 10/21/2011  . Swelling of limb 10/21/2011   Current Outpatient Medications on File Prior to Visit  Medication Sig Dispense Refill  . albuterol (PROVENTIL) (2.5 MG/3ML) 0.083% nebulizer solution Take 3 mLs (2.5 mg total) by nebulization every 4 (four) hours as needed for wheezing or shortness of breath. 75 mL 12  . Amino Acids-Protein Hydrolys (FEEDING SUPPLEMENT, PRO-STAT SUGAR FREE 64,) LIQD Take 30 mLs by mouth 2 (two) times daily. 900 mL 0  . aspirin EC 81 MG EC tablet Take 1 tablet (81 mg total) by mouth daily.    Marland Kitchen atorvastatin (LIPITOR) 10 MG tablet Take 10 mg by mouth daily.    Marland Kitchen atorvastatin (LIPITOR) 10 MG tablet Take 10 mg by mouth daily at 6 PM.    . bisacodyl (DULCOLAX) 10 MG suppository Place 1 suppository (10 mg total) rectally  daily as needed for moderate constipation. 12 suppository 0  . ciprofloxacin (CIPRO) 500 MG tablet Take 1 tablet (500 mg total) by mouth 2 (two) times daily. 20 tablet 0  . clindamycin (CLEOCIN) 300 MG capsule     . clopidogrel (PLAVIX) 75 MG tablet Take 75 mg by mouth daily.    . collagenase (SANTYL) ointment Apply topically daily. 15 g 0  . feeding supplement, GLUCERNA SHAKE, (GLUCERNA SHAKE) LIQD Take 237 mLs by mouth 3 (three) times daily between meals.  0  . gabapentin (NEURONTIN) 300 MG capsule Take 300 mg by mouth 3 (three) times daily.    Marland Kitchen gabapentin (NEURONTIN) 300 MG capsule Take 600 mg by mouth 3 (three) times daily.    Marland Kitchen glimepiride (AMARYL) 2 MG tablet Take 2 mg by mouth daily with breakfast.    . glimepiride (AMARYL) 2 MG tablet Take 2 mg by mouth daily with breakfast.    . HEPARIN LOCK FLUSH IV Inject into the vein.    Marland Kitchen HYDROcodone-acetaminophen (NORCO) 10-325 MG tablet Take 1 tablet by mouth every 6 (six) hours as needed for moderate pain.    Marland Kitchen lisinopril (PRINIVIL,ZESTRIL) 10 MG tablet Take 10 mg by mouth daily.    Marland Kitchen lisinopril (PRINIVIL,ZESTRIL) 10 MG tablet Take 10 mg by mouth daily.    Marland Kitchen lisinopril-hydrochlorothiazide (ZESTORETIC) 20-25 MG tablet     . Multiple Vitamin (MULTIVITAMIN WITH MINERALS) TABS tablet Take 1 tablet by mouth daily.    . multivitamin-iron-minerals-folic acid (CENTRUM) chewable tablet Chew 1 tablet  by mouth daily.    . nicotine (NICODERM CQ - DOSED IN MG/24 HOURS) 14 mg/24hr patch Place 1 patch (14 mg total) onto the skin daily. 28 patch 0  . omega-3 acid ethyl esters (LOVAZA) 1 g capsule Take 1 g by mouth daily.    . Omega-3 Fatty Acids (FISH OIL PO) Take 1 tablet by mouth daily.    Marland Kitchen oxyCODONE-acetaminophen (PERCOCET) 10-325 MG tablet Take 1 tablet by mouth every 6 (six) hours as needed for pain. 40 tablet 0  . polyethylene glycol (MIRALAX / GLYCOLAX) packet Take 17 g by mouth daily as needed for mild constipation.    . terbinafine (LAMISIL) 250 MG  tablet Take 250 mg by mouth daily.     No current facility-administered medications on file prior to visit.   No Known Allergies  No results found for this or any previous visit (from the past 2160 hour(s)).  Objective: There were no vitals filed for this visit.  General: Patient is awake, alert, oriented in no acute distress  Dermatology: Skin is warm and dry bilateral with a partial thickness ulceration present at Right amputation stump. Ulceration plantar stump that measures pre debridement measures 0.5x 0.7x 0.2 cm and post debridement measures 0.6 x 0.8x0.2 cm with a granular base with minimal macerated and keratotic margins. The ulceration does not probe to bone.  There is no malodor, no active drainage, no erythema, trace edema to stump on right.  No other acute signs of infection.  Vascular: Dorsalis Pedis pulse = 1/4 Bilateral,  Posterior Tibial pulse = 0/4 Bilateral,  Capillary Fill Time < 5 seconds, 1+ pitting edema.  Neurologic: Protective sensation absent bilateral.    Musculosketal: No pain with palpation to ulcerated area.  Status post right foot TMA.  No pain with compression to calves bilateral.  No results for input(s): GRAMSTAIN, LABORGA in the last 8760 hours.  Assessment and Plan:  Problem List Items Addressed This Visit      Cardiovascular and Mediastinum   PAD (peripheral artery disease) ([place])     Endocrine   Diabetic polyneuropathy associated with type 2 diabetes mellitus ([place])     Other   History of transmetatarsal amputation of right foot ([place])    Other Visit Diagnoses    Skin ulcer of right foot, limited to breakdown of skin ([place])    -  Primary     -Re-examined patient and discussed the progression of the wound and treatment alternatives. -Excisionally dedbrided ulceration at right amputation stump to healthy bleeding borders removing nonviable tissue using a sterile chisel blade. Wound measures post debridement as above. Wound was debrided to the  level of the dermis with viable wound base exposed to promote healing. Hemostasis was achieved with manuel pressure.  Minimal bleeding noted.  Patient tolerated procedure well without any discomfort or anesthesia necessary for this wound debridement. -Applied offloading padding, actisorb, and dry dressing and Coban to the right foot.  Advised patient to leave intact until next week  -Continue with offloading padding in shoe as well - Advised patient to go to the ER or return to office if the wound worsens or if constitutional symptoms are present. -Patient to return to office next week for follow-up wound care Landis Martins, DPM

## 2020-01-28 ENCOUNTER — Encounter: Payer: Self-pay | Admitting: Sports Medicine

## 2020-01-28 ENCOUNTER — Other Ambulatory Visit: Payer: Self-pay

## 2020-01-28 ENCOUNTER — Ambulatory Visit (INDEPENDENT_AMBULATORY_CARE_PROVIDER_SITE_OTHER): Payer: BC Managed Care – PPO | Admitting: Sports Medicine

## 2020-01-28 DIAGNOSIS — E1142 Type 2 diabetes mellitus with diabetic polyneuropathy: Secondary | ICD-10-CM | POA: Diagnosis not present

## 2020-01-28 DIAGNOSIS — I739 Peripheral vascular disease, unspecified: Secondary | ICD-10-CM

## 2020-01-28 DIAGNOSIS — Z89431 Acquired absence of right foot: Secondary | ICD-10-CM

## 2020-01-28 DIAGNOSIS — L97511 Non-pressure chronic ulcer of other part of right foot limited to breakdown of skin: Secondary | ICD-10-CM

## 2020-01-28 NOTE — Progress Notes (Signed)
Subjective: Adrian Bean is a 69 y.o. male patient seen in office for follow up evaluation of ulceration of the right foot amputation stump.  Reports that things are about the same with no changes. Denies any drainage from bandage and reports that he has been able to keep bandage clean and intact. Denies any other symptoms at this time.  Fasting blood sugar does not check as previously noted.   Patient Active Problem List   Diagnosis Date Noted  . Infected surgical wound 06/18/2018  . Peripheral polyneuropathy 06/18/2018  . Mixed dyslipidemia 06/17/2018  . Essential hypertension 12/12/2017  . Need for hepatitis C screening test 12/12/2017  . Screening for colon cancer 12/12/2017  . Diabetic polyneuropathy associated with type 2 diabetes mellitus ([place]) 09/17/2017  . 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 History of transmetatarsal amputation of right foot ([place]) 07/04/2017  . PAD (peripheral artery disease) ([place]) 06/02/2017  . Sepsis ([place]) 06/02/2017  . DM (diabetes mellitus), type 2 ([place]) 06/02/2017  . Tobacco abuse 06/02/2017  . Tick bite 01/22/2017  . Pain in limb 10/21/2011  . Swelling of limb 10/21/2011   Current Outpatient Medications on File Prior to Visit  Medication Sig Dispense Refill  . albuterol (PROVENTIL) (2.5 MG/3ML) 0.083% nebulizer solution Take 3 mLs (2.5 mg total) by nebulization every 4 (four) hours as needed for wheezing or shortness of breath. 75 mL 12  . Amino Acids-Protein Hydrolys (FEEDING SUPPLEMENT, PRO-STAT SUGAR FREE 64,) LIQD Take 30 mLs by mouth 2 (two) times daily. 900 mL 0  . aspirin EC 81 MG EC tablet Take 1 tablet (81 mg total) by mouth daily.    Marland Kitchen atorvastatin (LIPITOR) 10 MG tablet Take 10 mg by mouth daily.    Marland Kitchen atorvastatin (LIPITOR) 10 MG tablet Take 10 mg by mouth daily at 6 PM.    . bisacodyl (DULCOLAX) 10 MG suppository Place 1 suppository (10 mg total) rectally daily as needed for  moderate constipation. 12 suppository 0  . ciprofloxacin (CIPRO) 500 MG tablet Take 1 tablet (500 mg total) by mouth 2 (two) times daily. 20 tablet 0  . clindamycin (CLEOCIN) 300 MG capsule     . clopidogrel (PLAVIX) 75 MG tablet Take 75 mg by mouth daily.    . collagenase (SANTYL) ointment Apply topically daily. 15 g 0  . feeding supplement, GLUCERNA SHAKE, (GLUCERNA SHAKE) LIQD Take 237 mLs by mouth 3 (three) times daily between meals.  0  . gabapentin (NEURONTIN) 300 MG capsule Take 300 mg by mouth 3 (three) times daily.    Marland Kitchen gabapentin (NEURONTIN) 300 MG capsule Take 600 mg by mouth 3 (three) times daily.    Marland Kitchen glimepiride (AMARYL) 2 MG tablet Take 2 mg by mouth daily with breakfast.    . glimepiride (AMARYL) 2 MG tablet Take 2 mg by mouth daily with breakfast.    . HEPARIN LOCK FLUSH IV Inject into the vein.    Marland Kitchen HYDROcodone-acetaminophen (NORCO) 10-325 MG tablet Take 1 tablet by mouth every 6 (six) hours as needed for moderate pain.    Marland Kitchen lisinopril (PRINIVIL,ZESTRIL) 10 MG tablet Take 10 mg by mouth daily.    Marland Kitchen lisinopril (PRINIVIL,ZESTRIL) 10 MG tablet Take 10 mg by mouth daily.    Marland Kitchen lisinopril-hydrochlorothiazide (ZESTORETIC) 20-25 MG tablet     . Multiple Vitamin (MULTIVITAMIN WITH MINERALS) TABS tablet Take 1 tablet by mouth daily.    . multivitamin-iron-minerals-folic acid (CENTRUM) chewable tablet Chew 1 tablet by mouth daily.    Marland Kitchen  nicotine (NICODERM CQ - DOSED IN MG/24 HOURS) 14 mg/24hr patch Place 1 patch (14 mg total) onto the skin daily. 28 patch 0  . omega-3 acid ethyl esters (LOVAZA) 1 g capsule Take 1 g by mouth daily.    . Omega-3 Fatty Acids (FISH OIL PO) Take 1 tablet by mouth daily.    Marland Kitchen oxyCODONE-acetaminophen (PERCOCET) 10-325 MG tablet Take 1 tablet by mouth every 6 (six) hours as needed for pain. 40 tablet 0  . polyethylene glycol (MIRALAX / GLYCOLAX) packet Take 17 g by mouth daily as needed for mild constipation.    . terbinafine (LAMISIL) 250 MG tablet Take 250 mg  by mouth daily.     No current facility-administered medications on file prior to visit.   No Known Allergies  No results found for this or any previous visit (from the past 2160 hour(s)).  Objective: There were no vitals filed for this visit.  General: Patient is awake, alert, oriented in no acute distress  Dermatology: Skin is warm and dry bilateral with a partial thickness ulceration present at Right amputation stump. Ulceration plantar stump that measures pre debridement measures 0.4x 0.6x 0.2 cm and post debridement measures 0. 5 x 0. 7 x0.2 cm with a granular base with minimal macerated and keratotic margins. The ulceration does not probe to bone.  There is no malodor, no active drainage, no erythema, trace edema to stump on right.  No other acute signs of infection.  Vascular: Dorsalis Pedis pulse = 1/4 Bilateral,  Posterior Tibial pulse = 0/4 Bilateral,  Capillary Fill Time < 5 seconds, 1+ pitting edema.  Neurologic: Protective sensation absent bilateral.    Musculosketal: No pain with palpation to ulcerated area.  Status post right foot TMA.  No pain with compression to calves bilateral.  No results for input(s): GRAMSTAIN, LABORGA in the last 8760 hours.  Assessment and Plan:  Problem List Items Addressed This Visit      Cardiovascular and Mediastinum   PAD (peripheral artery disease) ([place])     Endocrine   Diabetic polyneuropathy associated with type 2 diabetes mellitus ([place])     Other   History of transmetatarsal amputation of right foot ([place])    Other Visit Diagnoses    Skin ulcer of right foot, limited to breakdown of skin ([place])    -  Primary     -Re-examined patient and discussed the progression of the wound and treatment alternatives. -Excisionally dedbrided ulceration at right amputation stump to healthy bleeding borders removing nonviable tissue using a sterile chisel blade. Wound measures post debridement as above. Wound was debrided to the level of the  dermis with viable wound base exposed to promote healing. Hemostasis was achieved with manuel pressure.  Minimal bleeding noted.  Patient tolerated procedure well without any discomfort or anesthesia necessary for this wound debridement like previous -Applied foot miracle cream, actisorb, and dry dressing and Coban to the right foot.  Advised patient to leave intact until next week  -Continue with offloading padding in shoe as well; replaced with a new pad at today's visit - Advised patient to go to the ER or return to office if the wound worsens or if constitutional symptoms are present. -Patient to return to office next week for follow-up wound care Landis Martins, DPM

## 2020-02-04 ENCOUNTER — Encounter: Payer: Self-pay | Admitting: Sports Medicine

## 2020-02-04 ENCOUNTER — Other Ambulatory Visit: Payer: Self-pay

## 2020-02-04 ENCOUNTER — Ambulatory Visit (INDEPENDENT_AMBULATORY_CARE_PROVIDER_SITE_OTHER): Payer: BC Managed Care – PPO | Admitting: Sports Medicine

## 2020-02-04 DIAGNOSIS — L97511 Non-pressure chronic ulcer of other part of right foot limited to breakdown of skin: Secondary | ICD-10-CM | POA: Diagnosis not present

## 2020-02-04 DIAGNOSIS — E1142 Type 2 diabetes mellitus with diabetic polyneuropathy: Secondary | ICD-10-CM

## 2020-02-04 DIAGNOSIS — Z89431 Acquired absence of right foot: Secondary | ICD-10-CM

## 2020-02-04 DIAGNOSIS — I739 Peripheral vascular disease, unspecified: Secondary | ICD-10-CM

## 2020-02-04 NOTE — Progress Notes (Signed)
Subjective: Adrian Bean is a 69 y.o. male patient seen in office for follow up evaluation of ulceration of the right foot amputation stump.  Reports no changes since last visit was able to keep dressing clean dry and intact. Denies any other symptoms at this time.  Fasting blood sugar does not check as previously noted.   Patient Active Problem List   Diagnosis Date Noted  . Infected surgical wound 06/18/2018  . Peripheral polyneuropathy 06/18/2018  . Mixed dyslipidemia 06/17/2018  . Essential hypertension 12/12/2017  . Need for hepatitis C screening test 12/12/2017  . Screening for colon cancer 12/12/2017  . Diabetic polyneuropathy associated with type 2 diabetes mellitus ([place]) 09/17/2017  . 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 History of transmetatarsal amputation of right foot ([place]) 07/04/2017  . PAD (peripheral artery disease) ([place]) 06/02/2017  . Sepsis ([place]) 06/02/2017  . DM (diabetes mellitus), type 2 ([place]) 06/02/2017  . Tobacco abuse 06/02/2017  . Tick bite 01/22/2017  . Pain in limb 10/21/2011  . Swelling of limb 10/21/2011   Current Outpatient Medications on File Prior to Visit  Medication Sig Dispense Refill  . albuterol (PROVENTIL) (2.5 MG/3ML) 0.083% nebulizer solution Take 3 mLs (2.5 mg total) by nebulization every 4 (four) hours as needed for wheezing or shortness of breath. 75 mL 12  . Amino Acids-Protein Hydrolys (FEEDING SUPPLEMENT, PRO-STAT SUGAR FREE 64,) LIQD Take 30 mLs by mouth 2 (two) times daily. 900 mL 0  . aspirin EC 81 MG EC tablet Take 1 tablet (81 mg total) by mouth daily.    Marland Kitchen atorvastatin (LIPITOR) 10 MG tablet Take 10 mg by mouth daily.    Marland Kitchen atorvastatin (LIPITOR) 10 MG tablet Take 10 mg by mouth daily at 6 PM.    . bisacodyl (DULCOLAX) 10 MG suppository Place 1 suppository (10 mg total) rectally daily as needed for moderate constipation. 12 suppository 0  . ciprofloxacin (CIPRO) 500 MG  tablet Take 1 tablet (500 mg total) by mouth 2 (two) times daily. 20 tablet 0  . clindamycin (CLEOCIN) 300 MG capsule     . clopidogrel (PLAVIX) 75 MG tablet Take 75 mg by mouth daily.    . collagenase (SANTYL) ointment Apply topically daily. 15 g 0  . feeding supplement, GLUCERNA SHAKE, (GLUCERNA SHAKE) LIQD Take 237 mLs by mouth 3 (three) times daily between meals.  0  . gabapentin (NEURONTIN) 300 MG capsule Take 300 mg by mouth 3 (three) times daily.    Marland Kitchen gabapentin (NEURONTIN) 300 MG capsule Take 600 mg by mouth 3 (three) times daily.    Marland Kitchen glimepiride (AMARYL) 2 MG tablet Take 2 mg by mouth daily with breakfast.    . glimepiride (AMARYL) 2 MG tablet Take 2 mg by mouth daily with breakfast.    . HEPARIN LOCK FLUSH IV Inject into the vein.    Marland Kitchen HYDROcodone-acetaminophen (NORCO) 10-325 MG tablet Take 1 tablet by mouth every 6 (six) hours as needed for moderate pain.    Marland Kitchen lisinopril (PRINIVIL,ZESTRIL) 10 MG tablet Take 10 mg by mouth daily.    Marland Kitchen lisinopril (PRINIVIL,ZESTRIL) 10 MG tablet Take 10 mg by mouth daily.    Marland Kitchen lisinopril-hydrochlorothiazide (ZESTORETIC) 20-25 MG tablet     . Multiple Vitamin (MULTIVITAMIN WITH MINERALS) TABS tablet Take 1 tablet by mouth daily.    . multivitamin-iron-minerals-folic acid (CENTRUM) chewable tablet Chew 1 tablet by mouth daily.    . nicotine (NICODERM CQ - DOSED IN MG/24 HOURS) 14  mg/24hr patch Place 1 patch (14 mg total) onto the skin daily. 28 patch 0  . omega-3 acid ethyl esters (LOVAZA) 1 g capsule Take 1 g by mouth daily.    . Omega-3 Fatty Acids (FISH OIL PO) Take 1 tablet by mouth daily.    Marland Kitchen oxyCODONE-acetaminophen (PERCOCET) 10-325 MG tablet Take 1 tablet by mouth every 6 (six) hours as needed for pain. 40 tablet 0  . polyethylene glycol (MIRALAX / GLYCOLAX) packet Take 17 g by mouth daily as needed for mild constipation.    . terbinafine (LAMISIL) 250 MG tablet Take 250 mg by mouth daily.     No current facility-administered medications on file  prior to visit.   No Known Allergies  No results found for this or any previous visit (from the past 2160 hour(s)).  Objective: There were no vitals filed for this visit.  General: Patient is awake, alert, oriented in no acute distress  Dermatology: Skin is warm and dry bilateral with a partial thickness ulceration present at Right amputation stump. Ulceration plantar stump that measures pre debridement measures 0.5x 0.6x 0.2 cm and post debridement measures 0. 5 x 0. 7 x0.2 cm, same as previous with a granular base with minimal macerated and keratotic margins. The ulceration does not probe to bone.  There is no malodor, no active drainage, no erythema, trace edema to stump on right.  No other acute signs of infection.  Vascular: Dorsalis Pedis pulse = 1/4 Bilateral,  Posterior Tibial pulse = 0/4 Bilateral,  Capillary Fill Time < 5 seconds, 1+ pitting edema.  Neurologic: Protective sensation absent bilateral.    Musculosketal: No pain with palpation to ulcerated area.  Status post right foot TMA.  No pain with compression to calves bilateral.  No results for input(s): GRAMSTAIN, LABORGA in the last 8760 hours.  Assessment and Plan:  Problem List Items Addressed This Visit      Cardiovascular and Mediastinum   PAD (peripheral artery disease) ([place])     Endocrine   Diabetic polyneuropathy associated with type 2 diabetes mellitus ([place])     Other   History of transmetatarsal amputation of right foot ([place])    Other Visit Diagnoses    Skin ulcer of right foot, limited to breakdown of skin ([place])    -  Primary     -Re-examined patient and discussed the progression of the wound and treatment alternatives. -Excisionally dedbrided ulceration at right amputation stump to healthy bleeding borders removing nonviable tissue using a sterile chisel blade. Wound measures post debridement as above. Wound was debrided to the level of the dermis with viable wound base exposed to promote healing.  Hemostasis was achieved with manuel pressure.  Minimal bleeding noted.  Patient tolerated procedure well without any discomfort or anesthesia necessary for this wound debridement like previous -Applied foot miracle cream, actisorb, and dry dressing and Coban to the right foot.  Advised patient to leave intact until next week  -Advised patient to consider OR debridement and application of graft and tendon lengthening procedure if he fails to continue to improve next visit we will proceed with signing paperwork and contact insurance for approval -Continue with offloading padding in shoe as well and encourage patient to heel weight-bear to avoid excessive forefoot weightbearing to help with wound healing - Advised patient to go to the ER or return to office if the wound worsens or if constitutional symptoms are present. -Patient to return to office next week for follow-up wound care Landis Martins, DPM

## 2020-02-11 ENCOUNTER — Encounter: Payer: Self-pay | Admitting: Sports Medicine

## 2020-02-11 ENCOUNTER — Other Ambulatory Visit: Payer: Self-pay

## 2020-02-11 ENCOUNTER — Ambulatory Visit (INDEPENDENT_AMBULATORY_CARE_PROVIDER_SITE_OTHER): Payer: BC Managed Care – PPO | Admitting: Sports Medicine

## 2020-02-11 DIAGNOSIS — L97511 Non-pressure chronic ulcer of other part of right foot limited to breakdown of skin: Secondary | ICD-10-CM

## 2020-02-11 DIAGNOSIS — E1142 Type 2 diabetes mellitus with diabetic polyneuropathy: Secondary | ICD-10-CM | POA: Diagnosis not present

## 2020-02-11 DIAGNOSIS — M216X1 Other acquired deformities of right foot: Secondary | ICD-10-CM

## 2020-02-11 DIAGNOSIS — Z89431 Acquired absence of right foot: Secondary | ICD-10-CM

## 2020-02-11 NOTE — Progress Notes (Signed)
Subjective: Adrian Bean is a 69 y.o. male patient seen in office for follow up evaluation of ulceration of the right foot amputation stump.  Reports that things are about the same was able to keep dressing clean dry and intact.  Denies any other symptoms at this time.  Fasting blood sugar does not check as previously noted.   Patient Active Problem List   Diagnosis Date Noted  . Infected surgical wound 06/18/2018  . Peripheral polyneuropathy 06/18/2018  . Mixed dyslipidemia 06/17/2018  . Essential hypertension 12/12/2017  . Need for hepatitis C screening test 12/12/2017  . Screening for colon cancer 12/12/2017  . Diabetic polyneuropathy associated with type 2 diabetes mellitus ([place]) 09/17/2017  . 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 History of transmetatarsal amputation of right foot ([place]) 07/04/2017  . PAD (peripheral artery disease) ([place]) 06/02/2017  . Sepsis ([place]) 06/02/2017  . DM (diabetes mellitus), type 2 ([place]) 06/02/2017  . Tobacco abuse 06/02/2017  . Tick bite 01/22/2017  . Pain in limb 10/21/2011  . Swelling of limb 10/21/2011   Current Outpatient Medications on File Prior to Visit  Medication Sig Dispense Refill  . albuterol (PROVENTIL) (2.5 MG/3ML) 0.083% nebulizer solution Take 3 mLs (2.5 mg total) by nebulization every 4 (four) hours as needed for wheezing or shortness of breath. 75 mL 12  . Amino Acids-Protein Hydrolys (FEEDING SUPPLEMENT, PRO-STAT SUGAR FREE 64,) LIQD Take 30 mLs by mouth 2 (two) times daily. 900 mL 0  . aspirin EC 81 MG EC tablet Take 1 tablet (81 mg total) by mouth daily.    Marland Kitchen atorvastatin (LIPITOR) 10 MG tablet Take 10 mg by mouth daily.    Marland Kitchen atorvastatin (LIPITOR) 10 MG tablet Take 10 mg by mouth daily at 6 PM.    . bisacodyl (DULCOLAX) 10 MG suppository Place 1 suppository (10 mg total) rectally daily as needed for moderate constipation. 12 suppository 0  . ciprofloxacin (CIPRO) 500 MG  tablet Take 1 tablet (500 mg total) by mouth 2 (two) times daily. 20 tablet 0  . clindamycin (CLEOCIN) 300 MG capsule     . clopidogrel (PLAVIX) 75 MG tablet Take 75 mg by mouth daily.    . collagenase (SANTYL) ointment Apply topically daily. 15 g 0  . feeding supplement, GLUCERNA SHAKE, (GLUCERNA SHAKE) LIQD Take 237 mLs by mouth 3 (three) times daily between meals.  0  . gabapentin (NEURONTIN) 300 MG capsule Take 300 mg by mouth 3 (three) times daily.    Marland Kitchen gabapentin (NEURONTIN) 300 MG capsule Take 600 mg by mouth 3 (three) times daily.    Marland Kitchen glimepiride (AMARYL) 2 MG tablet Take 2 mg by mouth daily with breakfast.    . glimepiride (AMARYL) 2 MG tablet Take 2 mg by mouth daily with breakfast.    . HEPARIN LOCK FLUSH IV Inject into the vein.    Marland Kitchen HYDROcodone-acetaminophen (NORCO) 10-325 MG tablet Take 1 tablet by mouth every 6 (six) hours as needed for moderate pain.    Marland Kitchen lisinopril (PRINIVIL,ZESTRIL) 10 MG tablet Take 10 mg by mouth daily.    Marland Kitchen lisinopril (PRINIVIL,ZESTRIL) 10 MG tablet Take 10 mg by mouth daily.    Marland Kitchen lisinopril-hydrochlorothiazide (ZESTORETIC) 20-25 MG tablet     . Multiple Vitamin (MULTIVITAMIN WITH MINERALS) TABS tablet Take 1 tablet by mouth daily.    . multivitamin-iron-minerals-folic acid (CENTRUM) chewable tablet Chew 1 tablet by mouth daily.    . nicotine (NICODERM CQ - DOSED IN MG/24  HOURS) 14 mg/24hr patch Place 1 patch (14 mg total) onto the skin daily. 28 patch 0  . omega-3 acid ethyl esters (LOVAZA) 1 g capsule Take 1 g by mouth daily.    . Omega-3 Fatty Acids (FISH OIL PO) Take 1 tablet by mouth daily.    Marland Kitchen oxyCODONE-acetaminophen (PERCOCET) 10-325 MG tablet Take 1 tablet by mouth every 6 (six) hours as needed for pain. 40 tablet 0  . polyethylene glycol (MIRALAX / GLYCOLAX) packet Take 17 g by mouth daily as needed for mild constipation.    . terbinafine (LAMISIL) 250 MG tablet Take 250 mg by mouth daily.     No current facility-administered medications on file  prior to visit.   No Known Allergies  No results found for this or any previous visit (from the past 2160 hour(s)).  Objective: There were no vitals filed for this visit.  General: Patient is awake, alert, oriented in no acute distress  Dermatology: Skin is warm and dry bilateral with a partial thickness ulceration present at Right amputation stump. Ulceration plantar stump that measures pre debridement measures 0.4x 0.4x 0.2 cm and post debridement measures 0. 5 x 0. 5 x0.2 cm, same as previous with a granular base with minimal macerated and keratotic margins. The ulceration does not probe to bone.  There is no malodor, no active drainage, no erythema, trace edema to stump on right.  No other acute signs of infection.  Vascular: Dorsalis Pedis pulse = 1/4 Bilateral,  Posterior Tibial pulse = 0/4 Bilateral,  Capillary Fill Time < 5 seconds, 1+ pitting edema.  Neurologic: Protective sensation absent bilateral.    Musculosketal: No pain with palpation to ulcerated area.  Status post right foot TMA.  Limited ankle joint range of motion/mild equinus noted on right.  No pain with compression to calves bilateral.  No results for input(s): GRAMSTAIN, LABORGA in the last 8760 hours.  Assessment and Plan:  Problem List Items Addressed This Visit      Endocrine   Diabetic polyneuropathy associated with type 2 diabetes mellitus ([place])     Other   History of transmetatarsal amputation of right foot ([place])    Other Visit Diagnoses    Skin ulcer of right foot, limited to breakdown of skin ([place])    -  Primary   Acquired equinus deformity of right foot         -Re-examined patient and discussed the progression of the wound and treatment alternatives. -Excisionally dedbrided ulceration at right amputation stump to healthy bleeding borders removing nonviable tissue using a sterile chisel blade. Wound measures post debridement as above. Wound was debrided to the level of the dermis with viable wound  base exposed to promote healing. Hemostasis was achieved with manuel pressure.  Minimal bleeding noted.  Patient tolerated procedure well without any discomfort or anesthesia necessary for this wound debridement like previous -Applied foot miracle cream, actisorb, and dry dressing and Coban to the right foot.  Advised patient to leave intact until next week  -Patient opt for surgical management. Consent obtained for wound debridement, application of graft and Achilles tendon lengthening on right.  Pre and Post op course explained. Risks, benefits, alternatives explained. No guarantees given or implied. Surgical booking slip submitted and provided patient with Surgical packet and info for [facility] -Patient desires to have surgery in October advised patient to hold onto his history and physical form and once he speaks with surgery scheduled for a date to get history and physical done  closer to the date of surgery since he wants to do it sometime in October -Continue with offloading padding in shoe as well and encourage patient to heel weight-bear to avoid excessive forefoot weightbearing to help with wound healing; at next visit we will see we have a small surgical shoe for the patient to use once we do surgery in October - Advised patient to go to the ER or return to office if the wound worsens or if constitutional symptoms are present. -Patient to return to office next week for follow-up wound care Landis Martins, DPM

## 2020-02-18 ENCOUNTER — Ambulatory Visit (INDEPENDENT_AMBULATORY_CARE_PROVIDER_SITE_OTHER): Payer: BC Managed Care – PPO | Admitting: Sports Medicine

## 2020-02-18 ENCOUNTER — Other Ambulatory Visit: Payer: Self-pay

## 2020-02-18 ENCOUNTER — Telehealth: Payer: Self-pay | Admitting: *Deleted

## 2020-02-18 ENCOUNTER — Encounter: Payer: Self-pay | Admitting: Sports Medicine

## 2020-02-18 DIAGNOSIS — M216X1 Other acquired deformities of right foot: Secondary | ICD-10-CM

## 2020-02-18 DIAGNOSIS — L97511 Non-pressure chronic ulcer of other part of right foot limited to breakdown of skin: Secondary | ICD-10-CM

## 2020-02-18 DIAGNOSIS — E1142 Type 2 diabetes mellitus with diabetic polyneuropathy: Secondary | ICD-10-CM

## 2020-02-18 DIAGNOSIS — Z89431 Acquired absence of right foot: Secondary | ICD-10-CM

## 2020-02-18 NOTE — Telephone Encounter (Signed)
"  I'm calling to set up an operation.  Dr. Cannon Kettle told me to call.  Call me back when you can."  Dr. Cannon Kettle asked me to give you a call to schedule a date for your surgery.  I also received your message.  Your surgery will be done at [facility].  Dr. Cannon Kettle does not have block time at [facility].  I can get a date for you but I will not be able to verify it for a particular date until about 3-4 days prior to the date.  "Well it'll be late October.  I want to find out how much it's going to cost me first before I do anything."  I sent a message to Jocelyn Lamer with the procedure codes and asked her to give you a call and give you an estimate.  Keep in mind, she can only give you our fees.  You'll need to call [facility] to see what the facility and anesthesia fees will be.  "This sounds like it's going to cost a lot.  I've already spent a lot.  I might just keep doing what Dr. Cannon Kettle has been doing.  I don't need anymore expenses.  Have the girl call me and I'll make a decision.  Do I call you back if I want to go through with it?"  Yes, give me a call.

## 2020-02-18 NOTE — Telephone Encounter (Signed)
thanks

## 2020-02-18 NOTE — Progress Notes (Signed)
Subjective: Adrian Bean is a 69 y.o. male patient seen in office for follow up evaluation of ulceration of the right foot amputation stump.  Reports no changes since last visit was able to keep dressing clean dry and intact like previous without any issues.  Patient does have questions about surgery planning.  Denies any other symptoms at this time.  Fasting blood sugar does not check as previously noted.   Patient Active Problem List   Diagnosis Date Noted  . Infected surgical wound 06/18/2018  . Peripheral polyneuropathy 06/18/2018  . Mixed dyslipidemia 06/17/2018  . Essential hypertension 12/12/2017  . Need for hepatitis C screening test 12/12/2017  . Screening for colon cancer 12/12/2017  . Diabetic polyneuropathy associated with type 2 diabetes mellitus ([place]) 09/17/2017  . 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 History of transmetatarsal amputation of right foot ([place]) 07/04/2017  . PAD (peripheral artery disease) ([place]) 06/02/2017  . Sepsis ([place]) 06/02/2017  . DM (diabetes mellitus), type 2 ([place]) 06/02/2017  . Tobacco abuse 06/02/2017  . Tick bite 01/22/2017  . Pain in limb 10/21/2011  . Swelling of limb 10/21/2011   Current Outpatient Medications on File Prior to Visit  Medication Sig Dispense Refill  . albuterol (PROVENTIL) (2.5 MG/3ML) 0.083% nebulizer solution Take 3 mLs (2.5 mg total) by nebulization every 4 (four) hours as needed for wheezing or shortness of breath. 75 mL 12  . Amino Acids-Protein Hydrolys (FEEDING SUPPLEMENT, PRO-STAT SUGAR FREE 64,) LIQD Take 30 mLs by mouth 2 (two) times daily. 900 mL 0  . aspirin EC 81 MG EC tablet Take 1 tablet (81 mg total) by mouth daily.    Marland Kitchen atorvastatin (LIPITOR) 10 MG tablet Take 10 mg by mouth daily.    Marland Kitchen atorvastatin (LIPITOR) 10 MG tablet Take 10 mg by mouth daily at 6 PM.    . bisacodyl (DULCOLAX) 10 MG suppository Place 1 suppository (10 mg total) rectally daily as  needed for moderate constipation. 12 suppository 0  . ciprofloxacin (CIPRO) 500 MG tablet Take 1 tablet (500 mg total) by mouth 2 (two) times daily. 20 tablet 0  . clindamycin (CLEOCIN) 300 MG capsule     . clopidogrel (PLAVIX) 75 MG tablet Take 75 mg by mouth daily.    . collagenase (SANTYL) ointment Apply topically daily. 15 g 0  . feeding supplement, GLUCERNA SHAKE, (GLUCERNA SHAKE) LIQD Take 237 mLs by mouth 3 (three) times daily between meals.  0  . gabapentin (NEURONTIN) 300 MG capsule Take 300 mg by mouth 3 (three) times daily.    Marland Kitchen gabapentin (NEURONTIN) 300 MG capsule Take 600 mg by mouth 3 (three) times daily.    Marland Kitchen glimepiride (AMARYL) 2 MG tablet Take 2 mg by mouth daily with breakfast.    . glimepiride (AMARYL) 2 MG tablet Take 2 mg by mouth daily with breakfast.    . HEPARIN LOCK FLUSH IV Inject into the vein.    Marland Kitchen HYDROcodone-acetaminophen (NORCO) 10-325 MG tablet Take 1 tablet by mouth every 6 (six) hours as needed for moderate pain.    Marland Kitchen lisinopril (PRINIVIL,ZESTRIL) 10 MG tablet Take 10 mg by mouth daily.    Marland Kitchen lisinopril (PRINIVIL,ZESTRIL) 10 MG tablet Take 10 mg by mouth daily.    Marland Kitchen lisinopril-hydrochlorothiazide (ZESTORETIC) 20-25 MG tablet     . Multiple Vitamin (MULTIVITAMIN WITH MINERALS) TABS tablet Take 1 tablet by mouth daily.    . multivitamin-iron-minerals-folic acid (CENTRUM) chewable tablet Chew 1 tablet by mouth  daily.    . nicotine (NICODERM CQ - DOSED IN MG/24 HOURS) 14 mg/24hr patch Place 1 patch (14 mg total) onto the skin daily. 28 patch 0  . omega-3 acid ethyl esters (LOVAZA) 1 g capsule Take 1 g by mouth daily.    . Omega-3 Fatty Acids (FISH OIL PO) Take 1 tablet by mouth daily.    Marland Kitchen oxyCODONE-acetaminophen (PERCOCET) 10-325 MG tablet Take 1 tablet by mouth every 6 (six) hours as needed for pain. 40 tablet 0  . polyethylene glycol (MIRALAX / GLYCOLAX) packet Take 17 g by mouth daily as needed for mild constipation.    . terbinafine (LAMISIL) 250 MG tablet  Take 250 mg by mouth daily.     No current facility-administered medications on file prior to visit.   No Known Allergies  No results found for this or any previous visit (from the past 2160 hour(s)).  Objective: There were no vitals filed for this visit.  General: Patient is awake, alert, oriented in no acute distress  Dermatology: Skin is warm and dry bilateral with a partial thickness ulceration present at Right amputation stump. Ulceration plantar stump that measures pre debridement measures 1x 0.8x 0.2 cm and post debridement measures 1. 5 x 1 x0.3cm, larger than previous with a granular base with minimal macerated and keratotic margins. The ulceration does not probe to bone.  There is no malodor, no active drainage, no erythema, trace edema to stump on right.  No other acute signs of infection.  Vascular: Dorsalis Pedis pulse = 1/4 Bilateral,  Posterior Tibial pulse = 0/4 Bilateral,  Capillary Fill Time < 5 seconds, 1+ pitting edema.  Neurologic: Protective sensation absent bilateral.    Musculosketal: No pain with palpation to ulcerated area.  Status post right foot TMA.  No pain with compression to calves bilateral.  No results for input(s): GRAMSTAIN, LABORGA in the last 8760 hours.  Assessment and Plan:  Problem List Items Addressed This Visit      Endocrine   Diabetic polyneuropathy associated with type 2 diabetes mellitus ([place])     Other   History of transmetatarsal amputation of right foot ([place])    Other Visit Diagnoses    Skin ulcer of right foot, limited to breakdown of skin ([place])    -  Primary   Acquired equinus deformity of right foot         -Re-examined patient and discussed the progression of the wound and treatment alternatives. -Excisionally dedbrided ulceration at right amputation stump to healthy bleeding borders removing nonviable tissue using a sterile chisel blade. Wound measures post debridement as above. Wound was debrided to the level of the dermis  with viable wound base exposed to promote healing. Hemostasis was achieved with manuel pressure.  Minimal bleeding noted.  Patient tolerated procedure well without any discomfort or anesthesia necessary for this wound debridement like previous -Applied Maxorb, and dry dressing and Coban to the right foot.  Advised patient to leave intact until next week  -Patient is awaiting scheduling for OR debridement and application of graft and lengthening of Achilles tendon on right foot; our surgery scheduler will call patient to discuss this and our billing office will also call patient to discuss out-of-pocket for surgery.  Patient desires to wait to have surgery sometime in October and expressed that if his insurance does not cover he may not be able to financially afford surgery at this time  -Continue with offloading padding in shoe as well and encourage patient to heel  weight-bear to avoid excessive forefoot weightbearing to help with wound healing like before and to continue with increased heel weightbearing - Advised patient to go to the ER or return to office if the wound worsens or if constitutional symptoms are present. -Patient to return to office next week for follow-up wound care Landis Martins, DPM

## 2020-02-25 ENCOUNTER — Other Ambulatory Visit: Payer: Self-pay

## 2020-02-25 ENCOUNTER — Ambulatory Visit (INDEPENDENT_AMBULATORY_CARE_PROVIDER_SITE_OTHER): Payer: BC Managed Care – PPO | Admitting: Sports Medicine

## 2020-02-25 ENCOUNTER — Encounter: Payer: Self-pay | Admitting: Sports Medicine

## 2020-02-25 DIAGNOSIS — L97511 Non-pressure chronic ulcer of other part of right foot limited to breakdown of skin: Secondary | ICD-10-CM | POA: Diagnosis not present

## 2020-02-25 DIAGNOSIS — I739 Peripheral vascular disease, unspecified: Secondary | ICD-10-CM

## 2020-02-25 DIAGNOSIS — M216X1 Other acquired deformities of right foot: Secondary | ICD-10-CM

## 2020-02-25 DIAGNOSIS — Z89431 Acquired absence of right foot: Secondary | ICD-10-CM

## 2020-02-25 DIAGNOSIS — E1142 Type 2 diabetes mellitus with diabetic polyneuropathy: Secondary | ICD-10-CM

## 2020-02-25 NOTE — Progress Notes (Signed)
Subjective: Adrian Bean is a 69 y.o. male patient seen in office for follow up evaluation of ulceration of the right foot amputation stump.  Reports that he was able to keep dressing clean dry and intact everything is going well but he is concerned about the cost of surgery.  Denies any other symptoms at this time.  Fasting blood sugar does not check as previously noted.   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 75 mL 12   Amino Acids-Protein Hydrolys (FEEDING SUPPLEMENT, PRO-STAT SUGAR FREE 64,) LIQD Take 30 mLs by mouth 2 (two) times daily. 900 mL 0   aspirin EC 81 MG EC tablet Take 1 tablet (81 mg total) by mouth daily.     atorvastatin (LIPITOR) 10 MG tablet Take 10 mg by mouth daily.     atorvastatin (LIPITOR) 10 MG tablet Take 10 mg by mouth daily at 6 PM.     bisacodyl (DULCOLAX) 10 MG suppository Place 1 suppository (10 mg total) rectally daily as needed for moderate constipation.  12 suppository 0   ciprofloxacin (CIPRO) 500 MG tablet Take 1 tablet (500 mg total) by mouth 2 (two) times daily. 20 tablet 0   clindamycin (CLEOCIN) 300 MG capsule      clopidogrel (PLAVIX) 75 MG tablet Take 75 mg by mouth daily.     collagenase (SANTYL) ointment Apply topically daily. 15 g 0   feeding supplement, GLUCERNA SHAKE, (GLUCERNA SHAKE) LIQD Take 237 mLs by mouth 3 (three) times daily between meals.  0   gabapentin (NEURONTIN) 300 MG capsule Take 300 mg by mouth 3 (three) times daily.     gabapentin (NEURONTIN) 300 MG capsule Take 600 mg by mouth 3 (three) times daily.     glimepiride (AMARYL) 2 MG tablet Take 2 mg by mouth daily with breakfast.     glimepiride (AMARYL) 2 MG tablet Take 2 mg by mouth daily with breakfast.     HEPARIN LOCK FLUSH IV Inject into the vein.     HYDROcodone-acetaminophen (NORCO) 10-325 MG tablet Take 1 tablet by mouth every 6 (six) hours as needed for moderate pain.     lisinopril (PRINIVIL,ZESTRIL) 10 MG tablet Take 10 mg by mouth daily.     lisinopril (PRINIVIL,ZESTRIL) 10 MG tablet Take 10 mg by mouth daily.     lisinopril-hydrochlorothiazide (ZESTORETIC) 20-25 MG tablet      Multiple Vitamin (MULTIVITAMIN WITH MINERALS) TABS tablet Take 1 tablet by mouth daily.     multivitamin-iron-minerals-folic acid (CENTRUM) chewable tablet Chew 1 tablet by mouth daily.  nicotine (NICODERM CQ - DOSED IN MG/24 HOURS) 14 mg/24hr patch Place 1 patch (14 mg total) onto the skin daily. 28 patch 0   omega-3 acid ethyl esters (LOVAZA) 1 g capsule Take 1 g by mouth daily.     Omega-3 Fatty Acids (FISH OIL PO) Take 1 tablet by mouth daily.     oxyCODONE-acetaminophen (PERCOCET) 10-325 MG tablet Take 1 tablet by mouth every 6 (six) hours as needed for pain. 40 tablet 0   polyethylene glycol (MIRALAX / GLYCOLAX) packet Take 17 g by mouth daily as needed for mild constipation.     terbinafine (LAMISIL) 250 MG tablet Take 250 mg by mouth daily.     No  current facility-administered medications on file prior to visit.   No Known Allergies  No results found for this or any previous visit (from the past 2160 hour(s)).  Objective: There were no vitals filed for this visit.  General: Patient is awake, alert, oriented in no acute distress  Dermatology: Skin is warm and dry bilateral with a partial thickness ulceration present at Right amputation stump. Ulceration plantar stump that measures pre debridement measures 0.9x 0.8x 0.2 cm and post debridement measures 1x 1 x0.3cm, smaller than previous with a granular base with minimal macerated and keratotic margins. The ulceration does not probe to bone.  There is no malodor, no active drainage, no erythema, trace edema to stump on right.  No other acute signs of infection.  Vascular: Dorsalis Pedis pulse = 1/4 Bilateral,  Posterior Tibial pulse = 0/4 Bilateral,  Capillary Fill Time < 5 seconds, 1+ pitting edema.  Neurologic: Protective sensation absent bilateral.    Musculosketal: No pain with palpation to ulcerated area.  Status post right foot TMA.  No pain with compression to calves bilateral.  No results for input(s): GRAMSTAIN, LABORGA in the last 8760 hours.  Assessment and Plan:  Problem List Items Addressed This Visit      Cardiovascular and Mediastinum   PAD (peripheral artery disease) ([place])     Endocrine   Diabetic polyneuropathy associated with type 2 diabetes mellitus ([place])     Other   History of transmetatarsal amputation of right foot ([place])    Other Visit Diagnoses    Skin ulcer of right foot, limited to breakdown of skin ([place])    -  Primary   Acquired equinus deformity of right foot         -Re-examined patient and discussed the progression of the wound and treatment alternatives. -Excisionally dedbrided ulceration at right amputation stump to healthy bleeding borders removing nonviable tissue using a sterile chisel blade. Wound measures post debridement as above. Wound  was debrided to the level of the dermis with viable wound base exposed to promote healing. Hemostasis was achieved with manuel pressure.  Minimal bleeding noted.  Patient tolerated procedure well without any discomfort or anesthesia necessary for this wound debridement like previous -Applied Maxsorb, Actisorb, and dry dressing and Coban to the right foot.  Advised patient to leave intact until next week  -Patient desires to hold off on any surgery at this time out of fear of financial cost -Continue with offloading padding in shoe as well and encourage patient to heel weight-bear like before - Advised patient to go to the ER or return to office if the wound worsens or if constitutional symptoms are present. -Patient to return to office next week for follow-up wound care Landis Martins, DPM

## 2020-03-01 ENCOUNTER — Ambulatory Visit (INDEPENDENT_AMBULATORY_CARE_PROVIDER_SITE_OTHER): Payer: BC Managed Care – PPO | Admitting: Sports Medicine

## 2020-03-01 ENCOUNTER — Ambulatory Visit: Payer: Medicare Other | Admitting: Sports Medicine

## 2020-03-01 ENCOUNTER — Other Ambulatory Visit: Payer: Self-pay

## 2020-03-01 ENCOUNTER — Encounter: Payer: Self-pay | Admitting: Sports Medicine

## 2020-03-01 DIAGNOSIS — L97511 Non-pressure chronic ulcer of other part of right foot limited to breakdown of skin: Secondary | ICD-10-CM | POA: Diagnosis not present

## 2020-03-01 DIAGNOSIS — I739 Peripheral vascular disease, unspecified: Secondary | ICD-10-CM | POA: Diagnosis not present

## 2020-03-01 DIAGNOSIS — M216X1 Other acquired deformities of right foot: Secondary | ICD-10-CM

## 2020-03-01 DIAGNOSIS — Z89431 Acquired absence of right foot: Secondary | ICD-10-CM

## 2020-03-01 DIAGNOSIS — E1142 Type 2 diabetes mellitus with diabetic polyneuropathy: Secondary | ICD-10-CM

## 2020-03-01 NOTE — Progress Notes (Signed)
Subjective: Adrian Bean is a 69 y.o. male patient seen in office for follow up evaluation of ulceration of the right foot amputation stump.  Reports that he was able to keep dressing clean dry and that he is doing okay.  Denies any acute symptoms since last week.   Fasting blood sugar does not check as previously noted.   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 75 mL 12   Amino Acids-Protein Hydrolys (FEEDING SUPPLEMENT, PRO-STAT SUGAR FREE 64,) LIQD Take 30 mLs by mouth 2 (two) times daily. 900 mL 0   aspirin EC 81 MG EC tablet Take 1 tablet (81 mg total) by mouth daily.     atorvastatin (LIPITOR) 10 MG tablet Take 10 mg by mouth daily.     atorvastatin (LIPITOR) 10 MG tablet Take 10 mg by mouth daily at 6 PM.     bisacodyl (DULCOLAX) 10 MG suppository Place 1 suppository (10 mg total) rectally daily as needed for moderate constipation. 12 suppository 0   ciprofloxacin (CIPRO) 500 MG  tablet Take 1 tablet (500 mg total) by mouth 2 (two) times daily. 20 tablet 0   clindamycin (CLEOCIN) 300 MG capsule      clopidogrel (PLAVIX) 75 MG tablet Take 75 mg by mouth daily.     collagenase (SANTYL) ointment Apply topically daily. 15 g 0   feeding supplement, GLUCERNA SHAKE, (GLUCERNA SHAKE) LIQD Take 237 mLs by mouth 3 (three) times daily between meals.  0   gabapentin (NEURONTIN) 300 MG capsule Take 300 mg by mouth 3 (three) times daily.     gabapentin (NEURONTIN) 300 MG capsule Take 600 mg by mouth 3 (three) times daily.     glimepiride (AMARYL) 2 MG tablet Take 2 mg by mouth daily with breakfast.     glimepiride (AMARYL) 2 MG tablet Take 2 mg by mouth daily with breakfast.     HEPARIN LOCK FLUSH IV Inject into the vein.     HYDROcodone-acetaminophen (NORCO) 10-325 MG tablet Take 1 tablet by mouth every 6 (six) hours as needed for moderate pain.     lisinopril (PRINIVIL,ZESTRIL) 10 MG tablet Take 10 mg by mouth daily.     lisinopril (PRINIVIL,ZESTRIL) 10 MG tablet Take 10 mg by mouth daily.     lisinopril-hydrochlorothiazide (ZESTORETIC) 20-25 MG tablet      Multiple Vitamin (MULTIVITAMIN WITH MINERALS) TABS tablet Take 1 tablet by mouth daily.     multivitamin-iron-minerals-folic acid (CENTRUM) chewable tablet Chew 1 tablet by mouth daily.     nicotine (NICODERM CQ - DOSED IN  MG/24 HOURS) 14 mg/24hr patch Place 1 patch (14 mg total) onto the skin daily. 28 patch 0   omega-3 acid ethyl esters (LOVAZA) 1 g capsule Take 1 g by mouth daily.     Omega-3 Fatty Acids (FISH OIL PO) Take 1 tablet by mouth daily.     oxyCODONE-acetaminophen (PERCOCET) 10-325 MG tablet Take 1 tablet by mouth every 6 (six) hours as needed for pain. 40 tablet 0   polyethylene glycol (MIRALAX / GLYCOLAX) packet Take 17 g by mouth daily as needed for mild constipation.     terbinafine (LAMISIL) 250 MG tablet Take 250 mg by mouth daily.     No current facility-administered medications on file  prior to visit.   No Known Allergies  No results found for this or any previous visit (from the past 2160 hour(s)).  Objective: There were no vitals filed for this visit.  General: Patient is awake, alert, oriented in no acute distress  Dermatology: Skin is warm and dry bilateral with a partial thickness ulceration present at Right amputation stump. Ulceration plantar stump that measures pre debridement measures 0.8x 0.8x 0.2 cm and post debridement measures 1x 0.9 x0.3cm, smaller than previous with a granular base with minimal macerated and keratotic margins. The ulceration does not probe to bone.  There is no malodor, no active drainage, no erythema, trace edema to stump on right.  No other acute signs of infection.  Vascular: Dorsalis Pedis pulse = 1/4 Bilateral,  Posterior Tibial pulse = 0/4 Bilateral,  Capillary Fill Time < 5 seconds, 1+ pitting edema.  Neurologic: Protective sensation absent bilateral.    Musculosketal: No pain with palpation to ulcerated area.  Status post right foot TMA.  No pain with compression to calves bilateral.  No results for input(s): GRAMSTAIN, LABORGA in the last 8760 hours.  Assessment and Plan:  Problem List Items Addressed This Visit      Cardiovascular and Mediastinum   PAD (peripheral artery disease) ([place])     Endocrine   Diabetic polyneuropathy associated with type 2 diabetes mellitus ([place])     Other   History of transmetatarsal amputation of right foot ([place])    Other Visit Diagnoses    Skin ulcer of right foot, limited to breakdown of skin ([place])    -  Primary   Acquired equinus deformity of right foot         -Re-examined patient and discussed the progression of the wound and treatment alternatives. -Excisionally dedbrided ulceration at right amputation stump to healthy bleeding borders removing nonviable tissue using a sterile chisel blade. Wound measures post debridement as above. Wound was debrided to the level of the dermis with  viable wound base exposed to promote healing. Hemostasis was achieved with manuel pressure.  Minimal bleeding noted.  Patient tolerated procedure well without any discomfort or anesthesia necessary for this wound debridement like previous -Applied Maxsorb, Actisorb, and dry dressing and Coban to the right foot.  Advised patient to leave intact until next week like previous -Continue with offloading padding in shoe as well and encourage patient to heel weight-bear like before - Advised patient to go to the ER or return to office if the wound worsens or if constitutional symptoms are present. -Patient to return to office next week for follow-up wound care Landis Martins, DPM

## 2020-03-03 ENCOUNTER — Ambulatory Visit: Payer: Medicare Other | Admitting: Sports Medicine

## 2020-03-10 ENCOUNTER — Encounter: Payer: Self-pay | Admitting: Sports Medicine

## 2020-03-10 ENCOUNTER — Other Ambulatory Visit: Payer: Self-pay

## 2020-03-10 ENCOUNTER — Ambulatory Visit (INDEPENDENT_AMBULATORY_CARE_PROVIDER_SITE_OTHER): Payer: BC Managed Care – PPO | Admitting: Sports Medicine

## 2020-03-10 DIAGNOSIS — E1142 Type 2 diabetes mellitus with diabetic polyneuropathy: Secondary | ICD-10-CM | POA: Diagnosis not present

## 2020-03-10 DIAGNOSIS — Z89431 Acquired absence of right foot: Secondary | ICD-10-CM

## 2020-03-10 DIAGNOSIS — I739 Peripheral vascular disease, unspecified: Secondary | ICD-10-CM | POA: Diagnosis not present

## 2020-03-10 DIAGNOSIS — L97511 Non-pressure chronic ulcer of other part of right foot limited to breakdown of skin: Secondary | ICD-10-CM

## 2020-03-10 DIAGNOSIS — M216X1 Other acquired deformities of right foot: Secondary | ICD-10-CM

## 2020-03-10 NOTE — Progress Notes (Signed)
Subjective: Adrian Bean is a 69 y.o. male patient seen in office for follow up evaluation of ulceration of the right foot amputation stump.  Reports that he was able to keep dressing clean dry and that he is doing okay since last visit denies nausea vomiting fever chills shortness of breath or chest pain or any other constitutional symptoms at this time.  Fasting blood sugar does not check as previously noted.   Patient Active Problem List   Diagnosis Date Noted  . Infected surgical wound 06/18/2018  . Peripheral polyneuropathy 06/18/2018  . Mixed dyslipidemia 06/17/2018  . Essential hypertension 12/12/2017  . Need for hepatitis C screening test 12/12/2017  . Screening for colon cancer 12/12/2017  . Diabetic polyneuropathy associated with type 2 diabetes mellitus ([place]) 09/17/2017  . 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 History of transmetatarsal amputation of right foot ([place]) 07/04/2017  . PAD (peripheral artery disease) ([place]) 06/02/2017  . Sepsis ([place]) 06/02/2017  . DM (diabetes mellitus), type 2 ([place]) 06/02/2017  . Tobacco abuse 06/02/2017  . Tick bite 01/22/2017  . Pain in limb 10/21/2011  . Swelling of limb 10/21/2011   Current Outpatient Medications on File Prior to Visit  Medication Sig Dispense Refill  . albuterol (PROVENTIL) (2.5 MG/3ML) 0.083% nebulizer solution Take 3 mLs (2.5 mg total) by nebulization every 4 (four) hours as needed for wheezing or shortness of breath. 75 mL 12  . Amino Acids-Protein Hydrolys (FEEDING SUPPLEMENT, PRO-STAT SUGAR FREE 64,) LIQD Take 30 mLs by mouth 2 (two) times daily. 900 mL 0  . aspirin EC 81 MG EC tablet Take 1 tablet (81 mg total) by mouth daily.    Marland Kitchen atorvastatin (LIPITOR) 10 MG tablet Take 10 mg by mouth daily.    Marland Kitchen atorvastatin (LIPITOR) 10 MG tablet Take 10 mg by mouth daily at 6 PM.    . bisacodyl (DULCOLAX) 10 MG suppository Place 1 suppository (10 mg total) rectally  daily as needed for moderate constipation. 12 suppository 0  . ciprofloxacin (CIPRO) 500 MG tablet Take 1 tablet (500 mg total) by mouth 2 (two) times daily. 20 tablet 0  . clindamycin (CLEOCIN) 300 MG capsule     . clopidogrel (PLAVIX) 75 MG tablet Take 75 mg by mouth daily.    . collagenase (SANTYL) ointment Apply topically daily. 15 g 0  . feeding supplement, GLUCERNA SHAKE, (GLUCERNA SHAKE) LIQD Take 237 mLs by mouth 3 (three) times daily between meals.  0  . gabapentin (NEURONTIN) 300 MG capsule Take 300 mg by mouth 3 (three) times daily.    Marland Kitchen gabapentin (NEURONTIN) 300 MG capsule Take 600 mg by mouth 3 (three) times daily.    Marland Kitchen glimepiride (AMARYL) 2 MG tablet Take 2 mg by mouth daily with breakfast.    . glimepiride (AMARYL) 2 MG tablet Take 2 mg by mouth daily with breakfast.    . HEPARIN LOCK FLUSH IV Inject into the vein.    Marland Kitchen HYDROcodone-acetaminophen (NORCO) 10-325 MG tablet Take 1 tablet by mouth every 6 (six) hours as needed for moderate pain.    Marland Kitchen lisinopril (PRINIVIL,ZESTRIL) 10 MG tablet Take 10 mg by mouth daily.    Marland Kitchen lisinopril (PRINIVIL,ZESTRIL) 10 MG tablet Take 10 mg by mouth daily.    Marland Kitchen lisinopril-hydrochlorothiazide (ZESTORETIC) 20-25 MG tablet     . Multiple Vitamin (MULTIVITAMIN WITH MINERALS) TABS tablet Take 1 tablet by mouth daily.    . multivitamin-iron-minerals-folic acid (CENTRUM) chewable tablet Chew 1 tablet  by mouth daily.    . nicotine (NICODERM CQ - DOSED IN MG/24 HOURS) 14 mg/24hr patch Place 1 patch (14 mg total) onto the skin daily. 28 patch 0  . omega-3 acid ethyl esters (LOVAZA) 1 g capsule Take 1 g by mouth daily.    . Omega-3 Fatty Acids (FISH OIL PO) Take 1 tablet by mouth daily.    Marland Kitchen oxyCODONE-acetaminophen (PERCOCET) 10-325 MG tablet Take 1 tablet by mouth every 6 (six) hours as needed for pain. 40 tablet 0  . polyethylene glycol (MIRALAX / GLYCOLAX) packet Take 17 g by mouth daily as needed for mild constipation.    . terbinafine (LAMISIL) 250 MG  tablet Take 250 mg by mouth daily.     No current facility-administered medications on file prior to visit.   No Known Allergies  No results found for this or any previous visit (from the past 2160 hour(s)).  Objective: There were no vitals filed for this visit.  General: Patient is awake, alert, oriented in no acute distress  Dermatology: Skin is warm and dry bilateral with a partial thickness ulceration present at Right amputation stump. Ulceration plantar stump that measures pre debridement measures 0.7x 0.5x 0.2 cm and post debridement measures 0.9 x 0. 6 x0.3cm, smaller than previous with a granular base with minimal macerated and keratotic margins. The ulceration does not probe to bone.  There is no malodor, no active drainage, no erythema, trace edema to stump on right.  No other acute signs of infection.  Vascular: Dorsalis Pedis pulse = 1/4 Bilateral,  Posterior Tibial pulse = 0/4 Bilateral,  Capillary Fill Time < 5 seconds, 1+ pitting edema.  Neurologic: Protective sensation absent bilateral.    Musculosketal: No pain with palpation to ulcerated area.  Status post right foot TMA.  No pain with compression to calves bilateral.  No results for input(s): GRAMSTAIN, LABORGA in the last 8760 hours.  Assessment and Plan:  Problem List Items Addressed This Visit      Cardiovascular and Mediastinum   PAD (peripheral artery disease) ([place])     Endocrine   Diabetic polyneuropathy associated with type 2 diabetes mellitus ([place])     Other   History of transmetatarsal amputation of right foot ([place])    Other Visit Diagnoses    Skin ulcer of right foot, limited to breakdown of skin ([place])    -  Primary   Acquired equinus deformity of right foot         -Re-examined patient and discussed the progression of the wound and treatment alternatives. -Excisionally dedbrided ulceration at right amputation stump to healthy bleeding borders removing nonviable tissue using a sterile chisel  blade. Wound measures post debridement as above. Wound was debrided to the level of the dermis with viable wound base exposed to promote healing. Hemostasis was achieved with manuel pressure.  Minimal bleeding noted.  Patient tolerated procedure well without any discomfort or anesthesia necessary for this wound debridement like previous -Applied Maxsorb, Actisorb, and dry dressing and Coban to the right foot.  Advised patient to leave intact until next week like previous to keep dressing clean dry and intact -Continue with offloading padding in shoe as well and encourage patient to heel weight-bear like before and to limit activity to help with wound healing - Advised patient to go to the ER or return to office if the wound worsens or if constitutional symptoms are present. -Patient to return to office next week for follow-up wound care Landis Martins, DPM

## 2020-03-17 ENCOUNTER — Other Ambulatory Visit: Payer: Self-pay

## 2020-03-17 ENCOUNTER — Encounter: Payer: Self-pay | Admitting: Sports Medicine

## 2020-03-17 ENCOUNTER — Ambulatory Visit (INDEPENDENT_AMBULATORY_CARE_PROVIDER_SITE_OTHER): Payer: BC Managed Care – PPO | Admitting: Sports Medicine

## 2020-03-17 DIAGNOSIS — L97511 Non-pressure chronic ulcer of other part of right foot limited to breakdown of skin: Secondary | ICD-10-CM | POA: Diagnosis not present

## 2020-03-17 DIAGNOSIS — M216X1 Other acquired deformities of right foot: Secondary | ICD-10-CM | POA: Diagnosis not present

## 2020-03-17 DIAGNOSIS — Z89431 Acquired absence of right foot: Secondary | ICD-10-CM

## 2020-03-17 DIAGNOSIS — E1142 Type 2 diabetes mellitus with diabetic polyneuropathy: Secondary | ICD-10-CM | POA: Diagnosis not present

## 2020-03-17 NOTE — Progress Notes (Signed)
Subjective: Adrian Bean is a 69 y.o. male patient seen in office for follow up evaluation of ulceration of the right foot amputation stump.  Reports that he has been doing well with no concerns or issues since last visit, patient denies nausea vomiting fever chills shortness of breath or chest pain or any other constitutional symptoms at this time.  Fasting blood sugar does not check like previous   Patient Active Problem List   Diagnosis Date Noted  . Infected surgical wound 06/18/2018  . Peripheral polyneuropathy 06/18/2018  . Mixed dyslipidemia 06/17/2018  . Essential hypertension 12/12/2017  . Need for hepatitis C screening test 12/12/2017  . Screening for colon cancer 12/12/2017  . Diabetic polyneuropathy associated with type 2 diabetes mellitus ([place]) 09/17/2017  . 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 History of transmetatarsal amputation of right foot ([place]) 07/04/2017  . PAD (peripheral artery disease) ([place]) 06/02/2017  . Sepsis ([place]) 06/02/2017  . DM (diabetes mellitus), type 2 ([place]) 06/02/2017  . Tobacco abuse 06/02/2017  . Tick bite 01/22/2017  . Pain in limb 10/21/2011  . Swelling of limb 10/21/2011   Current Outpatient Medications on File Prior to Visit  Medication Sig Dispense Refill  . albuterol (PROVENTIL) (2.5 MG/3ML) 0.083% nebulizer solution Take 3 mLs (2.5 mg total) by nebulization every 4 (four) hours as needed for wheezing or shortness of breath. 75 mL 12  . Amino Acids-Protein Hydrolys (FEEDING SUPPLEMENT, PRO-STAT SUGAR FREE 64,) LIQD Take 30 mLs by mouth 2 (two) times daily. 900 mL 0  . aspirin EC 81 MG EC tablet Take 1 tablet (81 mg total) by mouth daily.    Marland Kitchen atorvastatin (LIPITOR) 10 MG tablet Take 10 mg by mouth daily.    Marland Kitchen atorvastatin (LIPITOR) 10 MG tablet Take 10 mg by mouth daily at 6 PM.    . bisacodyl (DULCOLAX) 10 MG suppository Place 1 suppository (10 mg total) rectally daily as needed  for moderate constipation. 12 suppository 0  . ciprofloxacin (CIPRO) 500 MG tablet Take 1 tablet (500 mg total) by mouth 2 (two) times daily. 20 tablet 0  . clindamycin (CLEOCIN) 300 MG capsule     . clopidogrel (PLAVIX) 75 MG tablet Take 75 mg by mouth daily.    . collagenase (SANTYL) ointment Apply topically daily. 15 g 0  . feeding supplement, GLUCERNA SHAKE, (GLUCERNA SHAKE) LIQD Take 237 mLs by mouth 3 (three) times daily between meals.  0  . gabapentin (NEURONTIN) 300 MG capsule Take 300 mg by mouth 3 (three) times daily.    Marland Kitchen gabapentin (NEURONTIN) 300 MG capsule Take 600 mg by mouth 3 (three) times daily.    Marland Kitchen glimepiride (AMARYL) 2 MG tablet Take 2 mg by mouth daily with breakfast.    . glimepiride (AMARYL) 2 MG tablet Take 2 mg by mouth daily with breakfast.    . HEPARIN LOCK FLUSH IV Inject into the vein.    Marland Kitchen HYDROcodone-acetaminophen (NORCO) 10-325 MG tablet Take 1 tablet by mouth every 6 (six) hours as needed for moderate pain.    Marland Kitchen lisinopril (PRINIVIL,ZESTRIL) 10 MG tablet Take 10 mg by mouth daily.    Marland Kitchen lisinopril (PRINIVIL,ZESTRIL) 10 MG tablet Take 10 mg by mouth daily.    Marland Kitchen lisinopril-hydrochlorothiazide (ZESTORETIC) 20-25 MG tablet     . Multiple Vitamin (MULTIVITAMIN WITH MINERALS) TABS tablet Take 1 tablet by mouth daily.    . multivitamin-iron-minerals-folic acid (CENTRUM) chewable tablet Chew 1 tablet by mouth daily.    Marland Kitchen  nicotine (NICODERM CQ - DOSED IN MG/24 HOURS) 14 mg/24hr patch Place 1 patch (14 mg total) onto the skin daily. 28 patch 0  . omega-3 acid ethyl esters (LOVAZA) 1 g capsule Take 1 g by mouth daily.    . Omega-3 Fatty Acids (FISH OIL PO) Take 1 tablet by mouth daily.    Marland Kitchen oxyCODONE-acetaminophen (PERCOCET) 10-325 MG tablet Take 1 tablet by mouth every 6 (six) hours as needed for pain. 40 tablet 0  . polyethylene glycol (MIRALAX / GLYCOLAX) packet Take 17 g by mouth daily as needed for mild constipation.    . terbinafine (LAMISIL) 250 MG tablet Take 250  mg by mouth daily.     No current facility-administered medications on file prior to visit.   No Known Allergies  No results found for this or any previous visit (from the past 2160 hour(s)).  Objective: There were no vitals filed for this visit.  General: Patient is awake, alert, oriented in no acute distress  Dermatology: Skin is warm and dry bilateral with a partial thickness ulceration present at Right amputation stump. Ulceration plantar stump that measures pre debridement measures 0.4 x 0.5x 0.2 cm and post debridement measures 0.5 x 0. 6 x0.3cm, smaller than previous with a granular base with minimal macerated and keratotic margins. The ulceration does not probe to bone.  There is no malodor, no active drainage, no erythema, trace edema to stump on right.  No other acute signs of infection.  Vascular: Dorsalis Pedis pulse = 1/4 Bilateral,  Posterior Tibial pulse = 0/4 Bilateral,  Capillary Fill Time < 5 seconds, 1+ pitting edema.  Neurologic: Protective sensation absent bilateral.    Musculosketal: No pain with palpation to ulcerated area.  Status post right foot TMA.  No pain with compression to calves bilateral.  No results for input(s): GRAMSTAIN, LABORGA in the last 8760 hours.  Assessment and Plan:  Problem List Items Addressed This Visit      Endocrine   Diabetic polyneuropathy associated with type 2 diabetes mellitus ([place])     Other   History of transmetatarsal amputation of right foot ([place])    Other Visit Diagnoses    Skin ulcer of right foot, limited to breakdown of skin ([place])    -  Primary   Acquired equinus deformity of right foot         -Re-examined patient and discussed the progression of the wound and treatment alternatives. -Excisionally dedbrided ulceration at right amputation stump to healthy bleeding borders removing nonviable tissue using a sterile chisel blade. Wound measures post debridement as above. Wound was debrided to the level of the dermis  with viable wound base exposed to promote healing. Hemostasis was achieved with manuel pressure.  Minimal bleeding noted.  Patient tolerated procedure well without any discomfort or anesthesia necessary for this wound debridement like previous -Applied barrier cream to surrounding skin and then to the wound bed Maxsorb, Actisorb, and dry dressing and Coban to the right foot.  Advised patient to leave intact until next week like previous to keep dressing clean dry and intact -Continue with offloading padding in shoe as well and encourage patient to heel weight-bear like before and to limit activity to help with wound healing - Advised patient to go to the ER or return to office if the wound worsens or if constitutional symptoms are present. -Patient to return to office next week for follow-up wound care Landis Martins, DPM

## 2020-03-24 ENCOUNTER — Ambulatory Visit (INDEPENDENT_AMBULATORY_CARE_PROVIDER_SITE_OTHER): Payer: BC Managed Care – PPO | Admitting: Sports Medicine

## 2020-03-24 ENCOUNTER — Other Ambulatory Visit: Payer: Self-pay

## 2020-03-24 ENCOUNTER — Encounter: Payer: Self-pay | Admitting: Sports Medicine

## 2020-03-24 DIAGNOSIS — E1142 Type 2 diabetes mellitus with diabetic polyneuropathy: Secondary | ICD-10-CM

## 2020-03-24 DIAGNOSIS — L97511 Non-pressure chronic ulcer of other part of right foot limited to breakdown of skin: Secondary | ICD-10-CM

## 2020-03-24 DIAGNOSIS — M216X1 Other acquired deformities of right foot: Secondary | ICD-10-CM

## 2020-03-24 DIAGNOSIS — Z89431 Acquired absence of right foot: Secondary | ICD-10-CM

## 2020-03-24 DIAGNOSIS — I739 Peripheral vascular disease, unspecified: Secondary | ICD-10-CM

## 2020-03-24 NOTE — Progress Notes (Signed)
Subjective: Adrian Bean is a 69 y.o. male patient seen in office for follow up evaluation of ulceration of the right foot amputation stump.  Reports that things are about the same there is a little bit of drainage strikethrough through the dressing.  Patient denies nausea vomiting fever chills shortness of breath or chest pain or any other constitutional symptoms at this time.  Reports that he has been losing weight unintentionally and will discuss at his visit with his PCP.  Fasting blood sugar does not check like previous   Patient Active Problem List   Diagnosis Date Noted  . Infected surgical wound 06/18/2018  . Peripheral polyneuropathy 06/18/2018  . Mixed dyslipidemia 06/17/2018  . Essential hypertension 12/12/2017  . Need for hepatitis C screening test 12/12/2017  . Screening for colon cancer 12/12/2017  . Diabetic polyneuropathy associated with type 2 diabetes mellitus ([place]) 09/17/2017  . 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 History of transmetatarsal amputation of right foot ([place]) 07/04/2017  . PAD (peripheral artery disease) ([place]) 06/02/2017  . Sepsis ([place]) 06/02/2017  . DM (diabetes mellitus), type 2 ([place]) 06/02/2017  . Tobacco abuse 06/02/2017  . Tick bite 01/22/2017  . Pain in limb 10/21/2011  . Swelling of limb 10/21/2011   Current Outpatient Medications on File Prior to Visit  Medication Sig Dispense Refill  . albuterol (PROVENTIL) (2.5 MG/3ML) 0.083% nebulizer solution Take 3 mLs (2.5 mg total) by nebulization every 4 (four) hours as needed for wheezing or shortness of breath. 75 mL 12  . Amino Acids-Protein Hydrolys (FEEDING SUPPLEMENT, PRO-STAT SUGAR FREE 64,) LIQD Take 30 mLs by mouth 2 (two) times daily. 900 mL 0  . aspirin EC 81 MG EC tablet Take 1 tablet (81 mg total) by mouth daily.    Marland Kitchen atorvastatin (LIPITOR) 10 MG tablet Take 10 mg by mouth daily.    Marland Kitchen atorvastatin (LIPITOR) 10 MG tablet Take 10 mg  by mouth daily at 6 PM.    . bisacodyl (DULCOLAX) 10 MG suppository Place 1 suppository (10 mg total) rectally daily as needed for moderate constipation. 12 suppository 0  . ciprofloxacin (CIPRO) 500 MG tablet Take 1 tablet (500 mg total) by mouth 2 (two) times daily. 20 tablet 0  . clindamycin (CLEOCIN) 300 MG capsule     . clopidogrel (PLAVIX) 75 MG tablet Take 75 mg by mouth daily.    . collagenase (SANTYL) ointment Apply topically daily. 15 g 0  . feeding supplement, GLUCERNA SHAKE, (GLUCERNA SHAKE) LIQD Take 237 mLs by mouth 3 (three) times daily between meals.  0  . gabapentin (NEURONTIN) 300 MG capsule Take 300 mg by mouth 3 (three) times daily.    Marland Kitchen gabapentin (NEURONTIN) 300 MG capsule Take 600 mg by mouth 3 (three) times daily.    Marland Kitchen glimepiride (AMARYL) 2 MG tablet Take 2 mg by mouth daily with breakfast.    . glimepiride (AMARYL) 2 MG tablet Take 2 mg by mouth daily with breakfast.    . HEPARIN LOCK FLUSH IV Inject into the vein.    Marland Kitchen HYDROcodone-acetaminophen (NORCO) 10-325 MG tablet Take 1 tablet by mouth every 6 (six) hours as needed for moderate pain.    Marland Kitchen lisinopril (PRINIVIL,ZESTRIL) 10 MG tablet Take 10 mg by mouth daily.    Marland Kitchen lisinopril (PRINIVIL,ZESTRIL) 10 MG tablet Take 10 mg by mouth daily.    Marland Kitchen lisinopril-hydrochlorothiazide (ZESTORETIC) 20-25 MG tablet     . Multiple Vitamin (MULTIVITAMIN WITH MINERALS) TABS tablet  Take 1 tablet by mouth daily.    . multivitamin-iron-minerals-folic acid (CENTRUM) chewable tablet Chew 1 tablet by mouth daily.    . nicotine (NICODERM CQ - DOSED IN MG/24 HOURS) 14 mg/24hr patch Place 1 patch (14 mg total) onto the skin daily. 28 patch 0  . omega-3 acid ethyl esters (LOVAZA) 1 g capsule Take 1 g by mouth daily.    . Omega-3 Fatty Acids (FISH OIL PO) Take 1 tablet by mouth daily.    Marland Kitchen oxyCODONE-acetaminophen (PERCOCET) 10-325 MG tablet Take 1 tablet by mouth every 6 (six) hours as needed for pain. 40 tablet 0  . polyethylene glycol (MIRALAX  / GLYCOLAX) packet Take 17 g by mouth daily as needed for mild constipation.    . terbinafine (LAMISIL) 250 MG tablet Take 250 mg by mouth daily.     No current facility-administered medications on file prior to visit.   No Known Allergies  No results found for this or any previous visit (from the past 2160 hour(s)).  Objective: There were no vitals filed for this visit.  General: Patient is awake, alert, oriented in no acute distress  Dermatology: Skin is warm and dry bilateral with a partial thickness ulceration present at Right amputation stump. Ulceration plantar stump that measures pre debridement measures 0.4 x 0.5x 0.2 cm and post debridement measures 0. 6 x 0. 6 x0.3cm, smaller than previous with a granular base with minimal macerated and keratotic margins. The ulceration does not probe to bone.  There is no malodor, no active drainage, no erythema, trace edema to stump on right.  No other acute signs of infection.  Vascular: Dorsalis Pedis pulse = 1/4 Bilateral,  Posterior Tibial pulse = 0/4 Bilateral,  Capillary Fill Time < 5 seconds, 1+ pitting edema.  Neurologic: Protective sensation absent bilateral.    Musculosketal: No pain with palpation to ulcerated area.  Status post right foot TMA.  No pain with compression to calves bilateral.  No results for input(s): GRAMSTAIN, LABORGA in the last 8760 hours.  Assessment and Plan:  Problem List Items Addressed This Visit      Cardiovascular and Mediastinum   PAD (peripheral artery disease) ([place])     Endocrine   Diabetic polyneuropathy associated with type 2 diabetes mellitus ([place])     Other   History of transmetatarsal amputation of right foot ([place])    Other Visit Diagnoses    Skin ulcer of right foot, limited to breakdown of skin ([place])    -  Primary   Acquired equinus deformity of right foot         -Re-examined patient and discussed the progression of the wound and treatment alternatives. -Excisionally dedbrided  ulceration at right amputation stump to healthy bleeding borders removing nonviable tissue using a sterile chisel blade. Wound measures post debridement as above. Wound was debrided to the level of the dermis with viable wound base exposed to promote healing. Hemostasis was achieved with manuel pressure.  Minimal bleeding noted.  Patient tolerated procedure well without any discomfort or anesthesia necessary for this wound debridement like previous -Applied foot miracle cream to surrounding skin and then to the wound bed Maxsorb, and dry dressing and Coban to the right foot.  Advised patient to leave intact until next week like previous to keep dressing clean dry and intact -Continue with offloading padding in shoe as well and encourage patient to heel weight-bear like before and to limit activity to help with wound healing and to also replace any worn  bedroom slippers that may not be providing support - Advised patient to go to the ER or return to office if the wound worsens or if constitutional symptoms are present. -Patient to return to office next week for follow-up wound care Landis Martins, DPM

## 2020-03-31 ENCOUNTER — Encounter: Payer: Self-pay | Admitting: Sports Medicine

## 2020-03-31 ENCOUNTER — Ambulatory Visit (INDEPENDENT_AMBULATORY_CARE_PROVIDER_SITE_OTHER): Payer: BC Managed Care – PPO | Admitting: Sports Medicine

## 2020-03-31 ENCOUNTER — Other Ambulatory Visit: Payer: Self-pay

## 2020-03-31 DIAGNOSIS — I739 Peripheral vascular disease, unspecified: Secondary | ICD-10-CM

## 2020-03-31 DIAGNOSIS — L97511 Non-pressure chronic ulcer of other part of right foot limited to breakdown of skin: Secondary | ICD-10-CM | POA: Diagnosis not present

## 2020-03-31 DIAGNOSIS — Z89431 Acquired absence of right foot: Secondary | ICD-10-CM

## 2020-03-31 DIAGNOSIS — M216X1 Other acquired deformities of right foot: Secondary | ICD-10-CM

## 2020-03-31 DIAGNOSIS — E1142 Type 2 diabetes mellitus with diabetic polyneuropathy: Secondary | ICD-10-CM

## 2020-03-31 NOTE — Progress Notes (Signed)
Subjective: Adrian Bean is a 69 y.o. male patient seen in office for follow up evaluation of ulceration of the right foot amputation stump.  Reports that things are about the same, denies nausea vomiting fever chills shortness of breath or chest pain or any other constitutional symptoms at this time.  Fasting blood sugar does not recorded.   Patient Active Problem List   Diagnosis Date Noted  . Infected surgical wound 06/18/2018  . Peripheral polyneuropathy 06/18/2018  . Mixed dyslipidemia 06/17/2018  . Essential hypertension 12/12/2017  . Need for hepatitis C screening test 12/12/2017  . Screening for colon cancer 12/12/2017  . Diabetic polyneuropathy associated with type 2 diabetes mellitus ([place]) 09/17/2017  . 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 History of transmetatarsal amputation of right foot ([place]) 07/04/2017  . PAD (peripheral artery disease) ([place]) 06/02/2017  . Sepsis ([place]) 06/02/2017  . DM (diabetes mellitus), type 2 ([place]) 06/02/2017  . Tobacco abuse 06/02/2017  . Tick bite 01/22/2017  . Pain in limb 10/21/2011  . Swelling of limb 10/21/2011   Current Outpatient Medications on File Prior to Visit  Medication Sig Dispense Refill  . albuterol (PROVENTIL) (2.5 MG/3ML) 0.083% nebulizer solution Take 3 mLs (2.5 mg total) by nebulization every 4 (four) hours as needed for wheezing or shortness of breath. 75 mL 12  . Amino Acids-Protein Hydrolys (FEEDING SUPPLEMENT, PRO-STAT SUGAR FREE 64,) LIQD Take 30 mLs by mouth 2 (two) times daily. 900 mL 0  . aspirin EC 81 MG EC tablet Take 1 tablet (81 mg total) by mouth daily.    Marland Kitchen atorvastatin (LIPITOR) 10 MG tablet Take 10 mg by mouth daily.    Marland Kitchen atorvastatin (LIPITOR) 10 MG tablet Take 10 mg by mouth daily at 6 PM.    . bisacodyl (DULCOLAX) 10 MG suppository Place 1 suppository (10 mg total) rectally daily as needed for moderate constipation. 12 suppository 0  .  ciprofloxacin (CIPRO) 500 MG tablet Take 1 tablet (500 mg total) by mouth 2 (two) times daily. 20 tablet 0  . clindamycin (CLEOCIN) 300 MG capsule     . clopidogrel (PLAVIX) 75 MG tablet Take 75 mg by mouth daily.    . collagenase (SANTYL) ointment Apply topically daily. 15 g 0  . feeding supplement, GLUCERNA SHAKE, (GLUCERNA SHAKE) LIQD Take 237 mLs by mouth 3 (three) times daily between meals.  0  . gabapentin (NEURONTIN) 300 MG capsule Take 300 mg by mouth 3 (three) times daily.    Marland Kitchen gabapentin (NEURONTIN) 300 MG capsule Take 600 mg by mouth 3 (three) times daily.    Marland Kitchen glimepiride (AMARYL) 2 MG tablet Take 2 mg by mouth daily with breakfast.    . glimepiride (AMARYL) 2 MG tablet Take 2 mg by mouth daily with breakfast.    . HEPARIN LOCK FLUSH IV Inject into the vein.    Marland Kitchen HYDROcodone-acetaminophen (NORCO) 10-325 MG tablet Take 1 tablet by mouth every 6 (six) hours as needed for moderate pain.    Marland Kitchen lisinopril (PRINIVIL,ZESTRIL) 10 MG tablet Take 10 mg by mouth daily.    Marland Kitchen lisinopril (PRINIVIL,ZESTRIL) 10 MG tablet Take 10 mg by mouth daily.    Marland Kitchen lisinopril-hydrochlorothiazide (ZESTORETIC) 20-25 MG tablet     . Multiple Vitamin (MULTIVITAMIN WITH MINERALS) TABS tablet Take 1 tablet by mouth daily.    . multivitamin-iron-minerals-folic acid (CENTRUM) chewable tablet Chew 1 tablet by mouth daily.    . nicotine (NICODERM CQ - DOSED IN MG/24 HOURS)  14 mg/24hr patch Place 1 patch (14 mg total) onto the skin daily. 28 patch 0  . omega-3 acid ethyl esters (LOVAZA) 1 g capsule Take 1 g by mouth daily.    . Omega-3 Fatty Acids (FISH OIL PO) Take 1 tablet by mouth daily.    Marland Kitchen oxyCODONE-acetaminophen (PERCOCET) 10-325 MG tablet Take 1 tablet by mouth every 6 (six) hours as needed for pain. 40 tablet 0  . polyethylene glycol (MIRALAX / GLYCOLAX) packet Take 17 g by mouth daily as needed for mild constipation.    . terbinafine (LAMISIL) 250 MG tablet Take 250 mg by mouth daily.     No current  facility-administered medications on file prior to visit.   No Known Allergies  No results found for this or any previous visit (from the past 2160 hour(s)).  Objective: There were no vitals filed for this visit.  General: Patient is awake, alert, oriented in no acute distress  Dermatology: Skin is warm and dry bilateral with a partial thickness ulceration present at Right amputation stump. Ulceration plantar stump that measures pre debridement measures 0.4 x 0.4x 0.2 cm and post debridement measures 0. 5 x 0. 5 x0.3cm, slightly smaller than previous with a granular base with minimal macerated and keratotic margins. The ulceration does not probe to bone.  There is no malodor, no active drainage, no erythema, trace edema to stump on right.  No other acute signs of infection.  Vascular: Dorsalis Pedis pulse = 1/4 Bilateral,  Posterior Tibial pulse = 0/4 Bilateral,  Capillary Fill Time < 5 seconds, 1+ pitting edema.  Neurologic: Protective sensation absent bilateral.    Musculosketal: No pain with palpation to ulcerated area.  Status post right foot TMA.  No pain with compression to calves bilateral.  No results for input(s): GRAMSTAIN, LABORGA in the last 8760 hours.  Assessment and Plan:  Problem List Items Addressed This Visit      Cardiovascular and Mediastinum   PAD (peripheral artery disease) ([place])     Endocrine   Diabetic polyneuropathy associated with type 2 diabetes mellitus ([place])     Other   History of transmetatarsal amputation of right foot ([place])    Other Visit Diagnoses    Skin ulcer of right foot, limited to breakdown of skin ([place])    -  Primary   Acquired equinus deformity of right foot         -Re-examined patient and discussed the progression of the wound and treatment alternatives. -Excisionally dedbrided ulceration at right amputation stump to healthy bleeding borders removing nonviable tissue using a sterile chisel blade. Wound measures post debridement as  above. Wound was debrided to the level of the dermis with viable wound base exposed to promote healing. Hemostasis was achieved with manuel pressure.  Minimal bleeding noted.  Patient tolerated procedure well without any discomfort or anesthesia necessary for this wound debridement like previous -Applied foot miracle cream to surrounding skin and then to the wound bed Prisma, and dry dressing and Coban to the right foot.  Advised patient to leave intact until next week like previous to keep dressing clean dry and intact like previous -Continue with offloading padding in shoe as well and encourage patient to heel weight-bear like before and to limit activity to help with wound healing like before - Advised patient to go to the ER or return to office if the wound worsens or if constitutional symptoms are present. -Patient to return to office next week for follow-up wound care Adrian Bean  Cannon Kettle, DPM

## 2020-04-07 ENCOUNTER — Encounter: Payer: Self-pay | Admitting: Sports Medicine

## 2020-04-07 ENCOUNTER — Other Ambulatory Visit: Payer: Self-pay

## 2020-04-07 ENCOUNTER — Ambulatory Visit (INDEPENDENT_AMBULATORY_CARE_PROVIDER_SITE_OTHER): Payer: BC Managed Care – PPO | Admitting: Sports Medicine

## 2020-04-07 DIAGNOSIS — M216X1 Other acquired deformities of right foot: Secondary | ICD-10-CM

## 2020-04-07 DIAGNOSIS — L97511 Non-pressure chronic ulcer of other part of right foot limited to breakdown of skin: Secondary | ICD-10-CM | POA: Diagnosis not present

## 2020-04-07 DIAGNOSIS — Z89431 Acquired absence of right foot: Secondary | ICD-10-CM

## 2020-04-07 NOTE — Progress Notes (Signed)
Subjective: Adrian Bean is a 69 y.o. male patient seen in office for follow up evaluation of ulceration of the right foot amputation stump.  Reports that he had a little bit of drainage but otherwise doing okay, denies nausea vomiting fever chills shortness of breath or chest pain or any other constitutional symptoms at this time.  Fasting blood sugar does not recorded.   Patient Active Problem List   Diagnosis Date Noted  . Infected surgical wound 06/18/2018  . Peripheral polyneuropathy 06/18/2018  . Mixed dyslipidemia 06/17/2018  . Essential hypertension 12/12/2017  . Need for hepatitis C screening test 12/12/2017  . Screening for colon cancer 12/12/2017  . Diabetic polyneuropathy associated with type 2 diabetes mellitus (Adrian Bean) 09/17/2017  . 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 History of transmetatarsal amputation of right foot (Adrian Bean) 07/04/2017  . PAD (peripheral artery disease) (Adrian Bean) 06/02/2017  . Sepsis (Adrian Bean) 06/02/2017  . DM (diabetes mellitus), type 2 (Adrian Bean) 06/02/2017  . Tobacco abuse 06/02/2017  . Tick bite 01/22/2017  . Pain in limb 10/21/2011  . Swelling of limb 10/21/2011   Current Outpatient Medications on File Prior to Visit  Medication Sig Dispense Refill  . albuterol (PROVENTIL) (2.5 MG/3ML) 0.083% nebulizer solution Take 3 mLs (2.5 mg total) by nebulization every 4 (four) hours as needed for wheezing or shortness of breath. 75 mL 12  . Amino Acids-Protein Hydrolys (FEEDING SUPPLEMENT, PRO-STAT SUGAR FREE 64,) LIQD Take 30 mLs by mouth 2 (two) times daily. 900 mL 0  . aspirin EC 81 MG EC tablet Take 1 tablet (81 mg total) by mouth daily.    Marland Kitchen atorvastatin (LIPITOR) 10 MG tablet Take 10 mg by mouth daily.    Marland Kitchen atorvastatin (LIPITOR) 10 MG tablet Take 10 mg by mouth daily at 6 PM.    . bisacodyl (DULCOLAX) 10 MG suppository Place 1 suppository (10 mg total) rectally daily as needed for moderate constipation. 12  suppository 0  . ciprofloxacin (CIPRO) 500 MG tablet Take 1 tablet (500 mg total) by mouth 2 (two) times daily. 20 tablet 0  . clindamycin (CLEOCIN) 300 MG capsule     . clopidogrel (PLAVIX) 75 MG tablet Take 75 mg by mouth daily.    . collagenase (SANTYL) ointment Apply topically daily. 15 g 0  . feeding supplement, GLUCERNA SHAKE, (GLUCERNA SHAKE) LIQD Take 237 mLs by mouth 3 (three) times daily between meals.  0  . gabapentin (NEURONTIN) 300 MG capsule Take 300 mg by mouth 3 (three) times daily.    Marland Kitchen gabapentin (NEURONTIN) 300 MG capsule Take 600 mg by mouth 3 (three) times daily.    Marland Kitchen glimepiride (AMARYL) 2 MG tablet Take 2 mg by mouth daily with breakfast.    . glimepiride (AMARYL) 2 MG tablet Take 2 mg by mouth daily with breakfast.    . HEPARIN LOCK FLUSH IV Inject into the vein.    Marland Kitchen HYDROcodone-acetaminophen (NORCO) 10-325 MG tablet Take 1 tablet by mouth every 6 (six) hours as needed for moderate pain.    Marland Kitchen lisinopril (PRINIVIL,ZESTRIL) 10 MG tablet Take 10 mg by mouth daily.    Marland Kitchen lisinopril (PRINIVIL,ZESTRIL) 10 MG tablet Take 10 mg by mouth daily.    Marland Kitchen lisinopril-hydrochlorothiazide (ZESTORETIC) 20-25 MG tablet     . Multiple Vitamin (MULTIVITAMIN WITH MINERALS) TABS tablet Take 1 tablet by mouth daily.    . multivitamin-iron-minerals-folic acid (CENTRUM) chewable tablet Chew 1 tablet by mouth daily.    . nicotine (NICODERM  CQ - DOSED IN MG/24 HOURS) 14 mg/24hr patch Place 1 patch (14 mg total) onto the skin daily. 28 patch 0  . omega-3 acid ethyl esters (LOVAZA) 1 g capsule Take 1 g by mouth daily.    . Omega-3 Fatty Acids (FISH OIL PO) Take 1 tablet by mouth daily.    Marland Kitchen oxyCODONE-acetaminophen (PERCOCET) 10-325 MG tablet Take 1 tablet by mouth every 6 (six) hours as needed for pain. 40 tablet 0  . polyethylene glycol (MIRALAX / GLYCOLAX) packet Take 17 g by mouth daily as needed for mild constipation.    . terbinafine (LAMISIL) 250 MG tablet Take 250 mg by mouth daily.     No  current facility-administered medications on file prior to visit.   No Known Allergies  No results found for this or any previous visit (from the past 2160 hour(s)).  Objective: There were no vitals filed for this visit.  General: Patient is awake, alert, oriented in no acute distress  Dermatology: Skin is warm and dry bilateral with a partial thickness ulceration present at Right amputation stump. Ulceration plantar stump that measures pre debridement measures 0.4 x 0.4x 0.2 cm and post debridement measures 0.  6 x 0.  6 x0.3cm, slightly slightly larger than previous with a granular base with minimal macerated and keratotic margins. The ulceration does not probe to bone.  There is no malodor, no active drainage, no erythema, trace edema to stump on right.  No other acute signs of infection.  Vascular: Dorsalis Pedis pulse = 1/4 Bilateral,  Posterior Tibial pulse = 0/4 Bilateral,  Capillary Fill Time < 5 seconds, 1+ pitting edema.  Neurologic: Protective sensation absent bilateral.    Musculosketal: No pain with palpation to ulcerated area.  Status post right foot TMA.  No pain with compression to calves bilateral.  No results for input(s): GRAMSTAIN, LABORGA in the last 8760 hours.  Assessment and Plan:  Problem List Items Addressed This Visit      Other   History of transmetatarsal amputation of right foot ([name])    Other Visit Diagnoses    Skin ulcer of right foot, limited to breakdown of skin (Adrian Bean)    -  Primary   Acquired equinus deformity of right foot         -Re-examined patient and discussed the progression of the wound and treatment alternatives. -Excisionally dedbrided ulceration at right amputation stump to healthy bleeding borders removing nonviable tissue using a sterile chisel blade. Wound measures post debridement as above. Wound was debrided to the level of the dermis with viable wound base exposed to promote healing. Hemostasis was achieved with manuel pressure.   Minimal bleeding noted.  Patient tolerated procedure well without any discomfort or anesthesia necessary for this wound debridement like previous -Applied foot miracle cream to surrounding skin and then to the wound bed [facility] absorbent dressing, and dry dressing and Coban to the right foot.  Advised patient to leave intact until next week like previous to keep dressing clean dry and intact like previous -Continue with offloading padding in shoe as well at home - Advised patient to go to the ER or return to office if the wound worsens or if constitutional symptoms are present. -Patient to return to office next week for follow-up wound care Landis Martins, DPM

## 2020-04-14 ENCOUNTER — Encounter: Payer: Self-pay | Admitting: Sports Medicine

## 2020-04-14 ENCOUNTER — Ambulatory Visit (INDEPENDENT_AMBULATORY_CARE_PROVIDER_SITE_OTHER): Payer: BC Managed Care – PPO | Admitting: Sports Medicine

## 2020-04-14 ENCOUNTER — Other Ambulatory Visit: Payer: Self-pay

## 2020-04-14 DIAGNOSIS — I739 Peripheral vascular disease, unspecified: Secondary | ICD-10-CM

## 2020-04-14 DIAGNOSIS — L97511 Non-pressure chronic ulcer of other part of right foot limited to breakdown of skin: Secondary | ICD-10-CM | POA: Diagnosis not present

## 2020-04-14 DIAGNOSIS — E1142 Type 2 diabetes mellitus with diabetic polyneuropathy: Secondary | ICD-10-CM

## 2020-04-14 DIAGNOSIS — Z89431 Acquired absence of right foot: Secondary | ICD-10-CM

## 2020-04-14 DIAGNOSIS — M216X1 Other acquired deformities of right foot: Secondary | ICD-10-CM

## 2020-04-14 NOTE — Progress Notes (Signed)
Subjective: Adrian Bean is a 69 y.o. male patient seen in office for follow up evaluation of ulceration of the right foot amputation stump.  Reports that he is doing good with no issues, denies nausea vomiting fever chills shortness of breath or chest pain or any other constitutional symptoms at this time.  Fasting blood sugar does not recorded.   Patient Active Problem List   Diagnosis Date Noted  . Infected surgical wound 06/18/2018  . Peripheral polyneuropathy 06/18/2018  . Mixed dyslipidemia 06/17/2018  . Essential hypertension 12/12/2017  . Need for hepatitis C screening test 12/12/2017  . Screening for colon cancer 12/12/2017  . Diabetic polyneuropathy associated with type 2 diabetes mellitus ([place]) 09/17/2017  . 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 History of transmetatarsal amputation of right foot ([place]) 07/04/2017  . PAD (peripheral artery disease) ([place]) 06/02/2017  . Sepsis ([place]) 06/02/2017  . DM (diabetes mellitus), type 2 ([place]) 06/02/2017  . Tobacco abuse 06/02/2017  . Tick bite 01/22/2017  . Pain in limb 10/21/2011  . Swelling of limb 10/21/2011   Current Outpatient Medications on File Prior to Visit  Medication Sig Dispense Refill  . albuterol (PROVENTIL) (2.5 MG/3ML) 0.083% nebulizer solution Take 3 mLs (2.5 mg total) by nebulization every 4 (four) hours as needed for wheezing or shortness of breath. 75 mL 12  . Amino Acids-Protein Hydrolys (FEEDING SUPPLEMENT, PRO-STAT SUGAR FREE 64,) LIQD Take 30 mLs by mouth 2 (two) times daily. 900 mL 0  . aspirin EC 81 MG EC tablet Take 1 tablet (81 mg total) by mouth daily.    Marland Kitchen atorvastatin (LIPITOR) 10 MG tablet Take 10 mg by mouth daily.    Marland Kitchen atorvastatin (LIPITOR) 10 MG tablet Take 10 mg by mouth daily at 6 PM.    . bisacodyl (DULCOLAX) 10 MG suppository Place 1 suppository (10 mg total) rectally daily as needed for moderate constipation. 12 suppository 0  .  ciprofloxacin (CIPRO) 500 MG tablet Take 1 tablet (500 mg total) by mouth 2 (two) times daily. 20 tablet 0  . clindamycin (CLEOCIN) 300 MG capsule     . clopidogrel (PLAVIX) 75 MG tablet Take 75 mg by mouth daily.    . collagenase (SANTYL) ointment Apply topically daily. 15 g 0  . feeding supplement, GLUCERNA SHAKE, (GLUCERNA SHAKE) LIQD Take 237 mLs by mouth 3 (three) times daily between meals.  0  . gabapentin (NEURONTIN) 300 MG capsule Take 300 mg by mouth 3 (three) times daily.    Marland Kitchen gabapentin (NEURONTIN) 300 MG capsule Take 600 mg by mouth 3 (three) times daily.    Marland Kitchen glimepiride (AMARYL) 2 MG tablet Take 2 mg by mouth daily with breakfast.    . glimepiride (AMARYL) 2 MG tablet Take 2 mg by mouth daily with breakfast.    . HEPARIN LOCK FLUSH IV Inject into the vein.    Marland Kitchen HYDROcodone-acetaminophen (NORCO) 10-325 MG tablet Take 1 tablet by mouth every 6 (six) hours as needed for moderate pain.    Marland Kitchen lisinopril (PRINIVIL,ZESTRIL) 10 MG tablet Take 10 mg by mouth daily.    Marland Kitchen lisinopril (PRINIVIL,ZESTRIL) 10 MG tablet Take 10 mg by mouth daily.    Marland Kitchen lisinopril-hydrochlorothiazide (ZESTORETIC) 20-25 MG tablet     . Multiple Vitamin (MULTIVITAMIN WITH MINERALS) TABS tablet Take 1 tablet by mouth daily.    . multivitamin-iron-minerals-folic acid (CENTRUM) chewable tablet Chew 1 tablet by mouth daily.    . nicotine (NICODERM CQ - DOSED IN  MG/24 HOURS) 14 mg/24hr patch Place 1 patch (14 mg total) onto the skin daily. 28 patch 0  . omega-3 acid ethyl esters (LOVAZA) 1 g capsule Take 1 g by mouth daily.    . Omega-3 Fatty Acids (FISH OIL PO) Take 1 tablet by mouth daily.    Marland Kitchen oxyCODONE-acetaminophen (PERCOCET) 10-325 MG tablet Take 1 tablet by mouth every 6 (six) hours as needed for pain. 40 tablet 0  . polyethylene glycol (MIRALAX / GLYCOLAX) packet Take 17 g by mouth daily as needed for mild constipation.    . terbinafine (LAMISIL) 250 MG tablet Take 250 mg by mouth daily.     No current  facility-administered medications on file prior to visit.   No Known Allergies  No results found for this or any previous visit (from the past 2160 hour(s)).  Objective: There were no vitals filed for this visit.  General: Patient is awake, alert, oriented in no acute distress  Dermatology: Skin is warm and dry bilateral with a partial thickness ulceration present at Right amputation stump. Ulceration plantar stump that measures pre debridement measures 0.7 x 0.7x 0.2 cm and post debridement measures 0.  9 x 0. 8 x0.3cm, larger than previous with a granular base with minimal macerated and keratotic margins. The ulceration does not probe to bone.  There is no malodor, no active drainage, no erythema, trace edema to stump on right.  No other acute signs of infection.  Vascular: Dorsalis Pedis pulse = 1/4 Bilateral,  Posterior Tibial pulse = 0/4 Bilateral,  Capillary Fill Time < 5 seconds, 1+ pitting edema.  Neurologic: Protective sensation absent bilateral.    Musculosketal: No pain with palpation to ulcerated area.  Status post right foot TMA.  No pain with compression to calves bilateral.  No results for input(s): GRAMSTAIN, LABORGA in the last 8760 hours.  Assessment and Plan:  Problem List Items Addressed This Visit      Cardiovascular and Mediastinum   PAD (peripheral artery disease) ([place])     Endocrine   Diabetic polyneuropathy associated with type 2 diabetes mellitus ([place])     Other   History of transmetatarsal amputation of right foot ([place])    Other Visit Diagnoses    Skin ulcer of right foot, limited to breakdown of skin ([place])    -  Primary   Acquired equinus deformity of right foot         -Re-examined patient and discussed the progression of the wound and treatment alternatives. -Excisionally dedbrided ulceration at right amputation stump to healthy bleeding borders removing nonviable tissue using a sterile chisel blade. Wound measures post debridement as above. Wound  was debrided to the level of the dermis with viable wound base exposed to promote healing. Hemostasis was achieved with manuel pressure.  Minimal bleeding noted.  Patient tolerated procedure well without any discomfort or need for anesthesia. -Applied foot miracle cream to surrounding skin and then to the wound bed [facility] collagen wound gel and [facility] absorbent dressing, and dry dressing and Coban to the right foot.  Advised patient to leave intact until next week like previous to keep dressing clean dry and intact like previous -Continue with offloading padding in shoe as well at home - Advised patient to go to the ER or return to office if the wound worsens or if constitutional symptoms are present. -Patient to return to office next week for follow-up wound care Landis Martins, DPM

## 2020-04-21 ENCOUNTER — Other Ambulatory Visit: Payer: Self-pay

## 2020-04-21 ENCOUNTER — Ambulatory Visit (INDEPENDENT_AMBULATORY_CARE_PROVIDER_SITE_OTHER): Payer: BC Managed Care – PPO | Admitting: Sports Medicine

## 2020-04-21 ENCOUNTER — Encounter: Payer: Self-pay | Admitting: Sports Medicine

## 2020-04-21 DIAGNOSIS — Z89431 Acquired absence of right foot: Secondary | ICD-10-CM | POA: Diagnosis not present

## 2020-04-21 DIAGNOSIS — M216X1 Other acquired deformities of right foot: Secondary | ICD-10-CM

## 2020-04-21 DIAGNOSIS — L97511 Non-pressure chronic ulcer of other part of right foot limited to breakdown of skin: Secondary | ICD-10-CM

## 2020-04-21 DIAGNOSIS — E1142 Type 2 diabetes mellitus with diabetic polyneuropathy: Secondary | ICD-10-CM

## 2020-04-21 NOTE — Progress Notes (Signed)
Subjective: Adrian Bean is a 69 y.o. male patient seen in office for follow up evaluation of ulceration of the right foot amputation stump.  Reports that he is doing okay denies any changes or issues since last week. Denies nausea vomiting fever chills shortness of breath or chest pain or any other constitutional symptoms at this time.  Fasting blood sugar does not recorded.   Patient Active Problem List   Diagnosis Date Noted  . Infected surgical wound 06/18/2018  . Peripheral polyneuropathy 06/18/2018  . Mixed dyslipidemia 06/17/2018  . Essential hypertension 12/12/2017  . Need for hepatitis C screening test 12/12/2017  . Screening for colon cancer 12/12/2017  . Diabetic polyneuropathy associated with type 2 diabetes mellitus ([place]) 09/17/2017  . 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 History of transmetatarsal amputation of right foot ([place]) 07/04/2017  . PAD (peripheral artery disease) ([place]) 06/02/2017  . Sepsis ([place]) 06/02/2017  . DM (diabetes mellitus), type 2 ([place]) 06/02/2017  . Tobacco abuse 06/02/2017  . Tick bite 01/22/2017  . Pain in limb 10/21/2011  . Swelling of limb 10/21/2011   Current Outpatient Medications on File Prior to Visit  Medication Sig Dispense Refill  . albuterol (PROVENTIL) (2.5 MG/3ML) 0.083% nebulizer solution Take 3 mLs (2.5 mg total) by nebulization every 4 (four) hours as needed for wheezing or shortness of breath. 75 mL 12  . Amino Acids-Protein Hydrolys (FEEDING SUPPLEMENT, PRO-STAT SUGAR FREE 64,) LIQD Take 30 mLs by mouth 2 (two) times daily. 900 mL 0  . aspirin EC 81 MG EC tablet Take 1 tablet (81 mg total) by mouth daily.    Marland Kitchen atorvastatin (LIPITOR) 10 MG tablet Take 10 mg by mouth daily.    Marland Kitchen atorvastatin (LIPITOR) 10 MG tablet Take 10 mg by mouth daily at 6 PM.    . bisacodyl (DULCOLAX) 10 MG suppository Place 1 suppository (10 mg total) rectally daily as needed for moderate  constipation. 12 suppository 0  . ciprofloxacin (CIPRO) 500 MG tablet Take 1 tablet (500 mg total) by mouth 2 (two) times daily. 20 tablet 0  . clindamycin (CLEOCIN) 300 MG capsule     . clopidogrel (PLAVIX) 75 MG tablet Take 75 mg by mouth daily.    . collagenase (SANTYL) ointment Apply topically daily. 15 g 0  . feeding supplement, GLUCERNA SHAKE, (GLUCERNA SHAKE) LIQD Take 237 mLs by mouth 3 (three) times daily between meals.  0  . gabapentin (NEURONTIN) 300 MG capsule Take 300 mg by mouth 3 (three) times daily.    Marland Kitchen gabapentin (NEURONTIN) 300 MG capsule Take 600 mg by mouth 3 (three) times daily.    Marland Kitchen glimepiride (AMARYL) 2 MG tablet Take 2 mg by mouth daily with breakfast.    . glimepiride (AMARYL) 2 MG tablet Take 2 mg by mouth daily with breakfast.    . HEPARIN LOCK FLUSH IV Inject into the vein.    Marland Kitchen HYDROcodone-acetaminophen (NORCO) 10-325 MG tablet Take 1 tablet by mouth every 6 (six) hours as needed for moderate pain.    Marland Kitchen lisinopril (PRINIVIL,ZESTRIL) 10 MG tablet Take 10 mg by mouth daily.    Marland Kitchen lisinopril (PRINIVIL,ZESTRIL) 10 MG tablet Take 10 mg by mouth daily.    Marland Kitchen lisinopril-hydrochlorothiazide (ZESTORETIC) 20-25 MG tablet     . Multiple Vitamin (MULTIVITAMIN WITH MINERALS) TABS tablet Take 1 tablet by mouth daily.    . multivitamin-iron-minerals-folic acid (CENTRUM) chewable tablet Chew 1 tablet by mouth daily.    . nicotine (  NICODERM CQ - DOSED IN MG/24 HOURS) 14 mg/24hr patch Place 1 patch (14 mg total) onto the skin daily. 28 patch 0  . omega-3 acid ethyl esters (LOVAZA) 1 g capsule Take 1 g by mouth daily.    . Omega-3 Fatty Acids (FISH OIL PO) Take 1 tablet by mouth daily.    Marland Kitchen oxyCODONE-acetaminophen (PERCOCET) 10-325 MG tablet Take 1 tablet by mouth every 6 (six) hours as needed for pain. 40 tablet 0  . polyethylene glycol (MIRALAX / GLYCOLAX) packet Take 17 g by mouth daily as needed for mild constipation.    . terbinafine (LAMISIL) 250 MG tablet Take 250 mg by mouth  daily.     No current facility-administered medications on file prior to visit.   No Known Allergies  No results found for this or any previous visit (from the past 2160 hour(s)).  Objective: There were no vitals filed for this visit.  General: Patient is awake, alert, oriented in no acute distress  Dermatology: Skin is warm and dry bilateral with a partial thickness ulceration present at Right amputation stump. Ulceration plantar stump that measures pre debridement measures 0. 4 x 0. 5 x 0.2 cm and post debridement measures 0. 5 x 0. 6 x0.3cm, smaller than previous with a granular base with minimal macerated and keratotic margins. The ulceration does not probe to bone.  There is no malodor, no active drainage, no erythema, trace edema to stump on right.  No other acute signs of infection.  Vascular: Dorsalis Pedis pulse = 1/4 Bilateral,  Posterior Tibial pulse = 0/4 Bilateral,  Capillary Fill Time < 5 seconds, 1+ pitting edema.  Neurologic: Protective sensation absent bilateral.    Musculosketal: No pain with palpation to ulcerated area.  Status post right foot TMA.  No pain with compression to calves bilateral.  No results for input(s): GRAMSTAIN, LABORGA in the last 8760 hours.  Assessment and Plan:  Problem List Items Addressed This Visit      Endocrine   Diabetic polyneuropathy associated with type 2 diabetes mellitus ([place])     Other   History of transmetatarsal amputation of right foot ([place])    Other Visit Diagnoses    Skin ulcer of right foot, limited to breakdown of skin ([place])    -  Primary   Acquired equinus deformity of right foot         -Re-examined patient and discussed the progression of the wound and treatment alternatives. -Excisionally dedbrided ulceration at right amputation stump to healthy bleeding borders removing nonviable tissue using a sterile chisel blade. Wound measures post debridement as above. Wound was debrided to the level of the dermis with viable  wound base exposed to promote healing. Hemostasis was achieved with manuel pressure.  Minimal bleeding noted.  Patient tolerated procedure well without any discomfort or need for anesthesia. -Applied foot miracle cream to surrounding skin and then to the wound bed CellerateRx collagen wound gel again this visit and [facility] absorbent dressing, and dry dressing and Coban to the right foot.  Advised patient to leave intact until next week like previous to keep dressing clean dry and intact like previous -Continue with offloading padding in shoe as well at home - Advised patient to go to the ER or return to office if the wound worsens or if constitutional symptoms are present. -Patient to return to office next week for follow-up wound care Landis Martins, DPM

## 2020-04-28 ENCOUNTER — Other Ambulatory Visit: Payer: Self-pay

## 2020-04-28 ENCOUNTER — Ambulatory Visit (INDEPENDENT_AMBULATORY_CARE_PROVIDER_SITE_OTHER): Payer: BC Managed Care – PPO | Admitting: Sports Medicine

## 2020-04-28 ENCOUNTER — Encounter: Payer: Self-pay | Admitting: Sports Medicine

## 2020-04-28 DIAGNOSIS — I739 Peripheral vascular disease, unspecified: Secondary | ICD-10-CM

## 2020-04-28 DIAGNOSIS — M216X1 Other acquired deformities of right foot: Secondary | ICD-10-CM

## 2020-04-28 DIAGNOSIS — E1142 Type 2 diabetes mellitus with diabetic polyneuropathy: Secondary | ICD-10-CM

## 2020-04-28 DIAGNOSIS — Z89431 Acquired absence of right foot: Secondary | ICD-10-CM

## 2020-04-28 DIAGNOSIS — L97511 Non-pressure chronic ulcer of other part of right foot limited to breakdown of skin: Secondary | ICD-10-CM

## 2020-04-28 NOTE — Progress Notes (Signed)
Subjective: Adrian Bean is a 69 y.o. male patient seen in office for follow up evaluation of ulceration of the right foot amputation stump.  Reports that things are about the same.  Tries to walk on his heel but sometimes has a hard time doing so to keep pressure off the right foot.  He denies constitutional symptoms.  Fasting blood sugar does not recorded.   Patient Active Problem List   Diagnosis Date Noted  . Infected surgical wound 06/18/2018  . Peripheral polyneuropathy 06/18/2018  . Mixed dyslipidemia 06/17/2018  . Essential hypertension 12/12/2017  . Need for hepatitis C screening test 12/12/2017  . Screening for colon cancer 12/12/2017  . Diabetic polyneuropathy associated with type 2 diabetes mellitus ([place]) 09/17/2017  . 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 History of transmetatarsal amputation of right foot ([place]) 07/04/2017  . PAD (peripheral artery disease) ([place]) 06/02/2017  . Sepsis ([place]) 06/02/2017  . DM (diabetes mellitus), type 2 ([place]) 06/02/2017  . Tobacco abuse 06/02/2017  . Tick bite 01/22/2017  . Pain in limb 10/21/2011  . Swelling of limb 10/21/2011   Current Outpatient Medications on File Prior to Visit  Medication Sig Dispense Refill  . albuterol (PROVENTIL) (2.5 MG/3ML) 0.083% nebulizer solution Take 3 mLs (2.5 mg total) by nebulization every 4 (four) hours as needed for wheezing or shortness of breath. 75 mL 12  . Amino Acids-Protein Hydrolys (FEEDING SUPPLEMENT, PRO-STAT SUGAR FREE 64,) LIQD Take 30 mLs by mouth 2 (two) times daily. 900 mL 0  . aspirin EC 81 MG EC tablet Take 1 tablet (81 mg total) by mouth daily.    Marland Kitchen atorvastatin (LIPITOR) 10 MG tablet Take 10 mg by mouth daily.    Marland Kitchen atorvastatin (LIPITOR) 10 MG tablet Take 10 mg by mouth daily at 6 PM.    . bisacodyl (DULCOLAX) 10 MG suppository Place 1 suppository (10 mg total) rectally daily as needed for moderate constipation. 12 suppository  0  . ciprofloxacin (CIPRO) 500 MG tablet Take 1 tablet (500 mg total) by mouth 2 (two) times daily. 20 tablet 0  . clindamycin (CLEOCIN) 300 MG capsule     . clopidogrel (PLAVIX) 75 MG tablet Take 75 mg by mouth daily.    . collagenase (SANTYL) ointment Apply topically daily. 15 g 0  . feeding supplement, GLUCERNA SHAKE, (GLUCERNA SHAKE) LIQD Take 237 mLs by mouth 3 (three) times daily between meals.  0  . gabapentin (NEURONTIN) 300 MG capsule Take 300 mg by mouth 3 (three) times daily.    Marland Kitchen gabapentin (NEURONTIN) 300 MG capsule Take 600 mg by mouth 3 (three) times daily.    Marland Kitchen glimepiride (AMARYL) 2 MG tablet Take 2 mg by mouth daily with breakfast.    . glimepiride (AMARYL) 2 MG tablet Take 2 mg by mouth daily with breakfast.    . HEPARIN LOCK FLUSH IV Inject into the vein.    Marland Kitchen HYDROcodone-acetaminophen (NORCO) 10-325 MG tablet Take 1 tablet by mouth every 6 (six) hours as needed for moderate pain.    Marland Kitchen lisinopril (PRINIVIL,ZESTRIL) 10 MG tablet Take 10 mg by mouth daily.    Marland Kitchen lisinopril (PRINIVIL,ZESTRIL) 10 MG tablet Take 10 mg by mouth daily.    Marland Kitchen lisinopril-hydrochlorothiazide (ZESTORETIC) 20-25 MG tablet     . Multiple Vitamin (MULTIVITAMIN WITH MINERALS) TABS tablet Take 1 tablet by mouth daily.    . multivitamin-iron-minerals-folic acid (CENTRUM) chewable tablet Chew 1 tablet by mouth daily.    Marland Kitchen  nicotine (NICODERM CQ - DOSED IN MG/24 HOURS) 14 mg/24hr patch Place 1 patch (14 mg total) onto the skin daily. 28 patch 0  . omega-3 acid ethyl esters (LOVAZA) 1 g capsule Take 1 g by mouth daily.    . Omega-3 Fatty Acids (FISH OIL PO) Take 1 tablet by mouth daily.    Marland Kitchen oxyCODONE-acetaminophen (PERCOCET) 10-325 MG tablet Take 1 tablet by mouth every 6 (six) hours as needed for pain. 40 tablet 0  . polyethylene glycol (MIRALAX / GLYCOLAX) packet Take 17 g by mouth daily as needed for mild constipation.    . terbinafine (LAMISIL) 250 MG tablet Take 250 mg by mouth daily.     No current  facility-administered medications on file prior to visit.   No Known Allergies  No results found for this or any previous visit (from the past 2160 hour(s)).  Objective: There were no vitals filed for this visit.  General: Patient is awake, alert, oriented in no acute distress  Dermatology: Skin is warm and dry bilateral with a partial thickness ulceration present at Right amputation stump. Ulceration plantar stump that measures pre debridement measures 0. 4 x 0. 5 x 0.2 cm and post debridement measures 0. 5 x 0. 6 x0.3cm, same as previous with a granular base with increased macerated and keratotic margins. The ulceration does not probe to bone.  There is no malodor, no active drainage, no erythema, trace edema to stump on right.  No other acute signs of infection.  Vascular: Dorsalis Pedis pulse = 1/4 Bilateral,  Posterior Tibial pulse = 0/4 Bilateral,  Capillary Fill Time < 5 seconds, 1+ pitting edema.  Neurologic: Protective sensation absent bilateral.    Musculosketal: No pain with palpation to ulcerated area.  Status post right foot TMA.  No pain with compression to calves bilateral.  No results for input(s): GRAMSTAIN, LABORGA in the last 8760 hours.  Assessment and Plan:  Problem List Items Addressed This Visit      Cardiovascular and Mediastinum   PAD (peripheral artery disease) ([place])     Endocrine   Diabetic polyneuropathy associated with type 2 diabetes mellitus ([place])     Other   History of transmetatarsal amputation of right foot ([place])    Other Visit Diagnoses    Skin ulcer of right foot, limited to breakdown of skin ([place])    -  Primary   Acquired equinus deformity of right foot         -Re-examined patient and discussed the progression of the wound and treatment alternatives. -Excisionally dedbrided ulceration at right amputation stump to healthy bleeding borders removing nonviable tissue using a sterile chisel blade. Wound measures post debridement as above. Wound  was debrided to the level of the dermis with viable wound base exposed to promote healing. Hemostasis was achieved with manuel pressure.  Minimal bleeding noted.  Patient tolerated procedure well without any discomfort or need for anesthesia. -Applied foot miracle cream to surrounding skin and then to the wound bed CellerateRx collagen wound gel again this visit and ABD dressing, and dry dressing and Coban to the right foot.  Advised patient to leave intact until next week like previous to keep dressing clean dry and intact like previous -Continue with offloading padding in shoe as well at home - Advised patient to go to the ER or return to office if the wound worsens or if constitutional symptoms are present. -Patient to return to office next week for follow-up wound care Landis Martins, DPM

## 2020-05-05 ENCOUNTER — Other Ambulatory Visit: Payer: Self-pay

## 2020-05-05 ENCOUNTER — Ambulatory Visit (INDEPENDENT_AMBULATORY_CARE_PROVIDER_SITE_OTHER): Payer: BC Managed Care – PPO | Admitting: Sports Medicine

## 2020-05-05 ENCOUNTER — Encounter: Payer: Self-pay | Admitting: Sports Medicine

## 2020-05-05 DIAGNOSIS — M216X1 Other acquired deformities of right foot: Secondary | ICD-10-CM

## 2020-05-05 DIAGNOSIS — I739 Peripheral vascular disease, unspecified: Secondary | ICD-10-CM

## 2020-05-05 DIAGNOSIS — E1142 Type 2 diabetes mellitus with diabetic polyneuropathy: Secondary | ICD-10-CM

## 2020-05-05 DIAGNOSIS — L97511 Non-pressure chronic ulcer of other part of right foot limited to breakdown of skin: Secondary | ICD-10-CM

## 2020-05-05 DIAGNOSIS — Z89431 Acquired absence of right foot: Secondary | ICD-10-CM

## 2020-05-05 NOTE — Progress Notes (Signed)
Subjective: Adrian Bean is a 69 y.o. male patient seen in office for follow up evaluation of ulceration of the right foot amputation stump.  Reports he is doing good has not had any problems since last week and was able to keep bandage clean dry and intact.  Patient reports that he still tries the best he can to walk on his heel.  He denies constitutional symptoms or any other pedal complaints at this time.  Fasting blood sugar does not recorded.   Patient Active Problem List   Diagnosis Date Noted  . Infected surgical wound 06/18/2018  . Peripheral polyneuropathy 06/18/2018  . Mixed dyslipidemia 06/17/2018  . Essential hypertension 12/12/2017  . Need for hepatitis C screening test 12/12/2017  . Screening for colon cancer 12/12/2017  . Diabetic polyneuropathy associated with type 2 diabetes mellitus ([place]) 09/17/2017  . 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 History of transmetatarsal amputation of right foot ([place]) 07/04/2017  . PAD (peripheral artery disease) ([place]) 06/02/2017  . Sepsis ([place]) 06/02/2017  . DM (diabetes mellitus), type 2 ([place]) 06/02/2017  . Tobacco abuse 06/02/2017  . Tick bite 01/22/2017  . Pain in limb 10/21/2011  . Swelling of limb 10/21/2011   Current Outpatient Medications on File Prior to Visit  Medication Sig Dispense Refill  . albuterol (PROVENTIL) (2.5 MG/3ML) 0.083% nebulizer solution Take 3 mLs (2.5 mg total) by nebulization every 4 (four) hours as needed for wheezing or shortness of breath. 75 mL 12  . Amino Acids-Protein Hydrolys (FEEDING SUPPLEMENT, PRO-STAT SUGAR FREE 64,) LIQD Take 30 mLs by mouth 2 (two) times daily. 900 mL 0  . aspirin EC 81 MG EC tablet Take 1 tablet (81 mg total) by mouth daily.    Marland Kitchen atorvastatin (LIPITOR) 10 MG tablet Take 10 mg by mouth daily.    Marland Kitchen atorvastatin (LIPITOR) 10 MG tablet Take 10 mg by mouth daily at 6 PM.    . bisacodyl (DULCOLAX) 10 MG suppository Place 1  suppository (10 mg total) rectally daily as needed for moderate constipation. 12 suppository 0  . ciprofloxacin (CIPRO) 500 MG tablet Take 1 tablet (500 mg total) by mouth 2 (two) times daily. 20 tablet 0  . clindamycin (CLEOCIN) 300 MG capsule     . clopidogrel (PLAVIX) 75 MG tablet Take 75 mg by mouth daily.    . collagenase (SANTYL) ointment Apply topically daily. 15 g 0  . feeding supplement, GLUCERNA SHAKE, (GLUCERNA SHAKE) LIQD Take 237 mLs by mouth 3 (three) times daily between meals.  0  . gabapentin (NEURONTIN) 300 MG capsule Take 300 mg by mouth 3 (three) times daily.    Marland Kitchen gabapentin (NEURONTIN) 300 MG capsule Take 600 mg by mouth 3 (three) times daily.    Marland Kitchen glimepiride (AMARYL) 2 MG tablet Take 2 mg by mouth daily with breakfast.    . glimepiride (AMARYL) 2 MG tablet Take 2 mg by mouth daily with breakfast.    . HEPARIN LOCK FLUSH IV Inject into the vein.    Marland Kitchen HYDROcodone-acetaminophen (NORCO) 10-325 MG tablet Take 1 tablet by mouth every 6 (six) hours as needed for moderate pain.    Marland Kitchen lisinopril (PRINIVIL,ZESTRIL) 10 MG tablet Take 10 mg by mouth daily.    Marland Kitchen lisinopril (PRINIVIL,ZESTRIL) 10 MG tablet Take 10 mg by mouth daily.    Marland Kitchen lisinopril-hydrochlorothiazide (ZESTORETIC) 20-25 MG tablet     . Multiple Vitamin (MULTIVITAMIN WITH MINERALS) TABS tablet Take 1 tablet by mouth daily.    Marland Kitchen  multivitamin-iron-minerals-folic acid (CENTRUM) chewable tablet Chew 1 tablet by mouth daily.    . nicotine (NICODERM CQ - DOSED IN MG/24 HOURS) 14 mg/24hr patch Place 1 patch (14 mg total) onto the skin daily. 28 patch 0  . omega-3 acid ethyl esters (LOVAZA) 1 g capsule Take 1 g by mouth daily.    . Omega-3 Fatty Acids (FISH OIL PO) Take 1 tablet by mouth daily.    Marland Kitchen oxyCODONE-acetaminophen (PERCOCET) 10-325 MG tablet Take 1 tablet by mouth every 6 (six) hours as needed for pain. 40 tablet 0  . polyethylene glycol (MIRALAX / GLYCOLAX) packet Take 17 g by mouth daily as needed for mild constipation.     . terbinafine (LAMISIL) 250 MG tablet Take 250 mg by mouth daily.     No current facility-administered medications on file prior to visit.   No Known Allergies  No results found for this or any previous visit (from the past 2160 hour(s)).  Objective: There were no vitals filed for this visit.  General: Patient is awake, alert, oriented in no acute distress  Dermatology: Skin is warm and dry bilateral with a partial thickness ulceration present at Right amputation stump. Ulceration plantar stump that measures pre debridement measures 0. 4 x 0. 5 x 0.2 cm and post debridement measures 0. 5 x 0. 5 x0.3cm, same as previous with a granular base with increased macerated and keratotic margins. The ulceration does not probe to bone.  There is no malodor, no active drainage, no erythema, trace edema to stump on right.  No other acute signs of infection.  Vascular: Dorsalis Pedis pulse = 1/4 Bilateral,  Posterior Tibial pulse = 0/4 Bilateral,  Capillary Fill Time < 5 seconds, 1+ pitting edema.  Neurologic: Protective sensation absent bilateral.    Musculosketal: No pain with palpation to ulcerated area.  Status post right foot TMA.  No pain with compression to calves bilateral.  No results for input(s): GRAMSTAIN, LABORGA in the last 8760 hours.  Assessment and Plan:  Problem List Items Addressed This Visit      Cardiovascular and Mediastinum   PAD (peripheral artery disease) ([place])     Endocrine   Diabetic polyneuropathy associated with type 2 diabetes mellitus ([place])     Other   History of transmetatarsal amputation of right foot ([place])    Other Visit Diagnoses    Skin ulcer of right foot, limited to breakdown of skin ([place])    -  Primary   Acquired equinus deformity of right foot         -Re-examined patient and discussed the progression of the wound and treatment alternatives. -Excisionally dedbrided ulceration at right amputation stump to healthy bleeding borders removing nonviable  tissue using a sterile chisel blade. Wound measures post debridement as above. Wound was debrided to the level of the dermis with viable wound base exposed to promote healing. Hemostasis was achieved with manuel pressure.  Minimal bleeding noted.  Patient tolerated procedure well without any discomfort or need for anesthesia. -Applied skin protectant and foot miracle cream to surrounding skin and then to the wound bed CellerateRx collagen wound gel again this visit and ABD dressing, and dry dressing and Coban to the right foot.  Advised patient to leave intact until next week like previous to keep dressing clean dry and intact like previous -Continue with offloading padding in shoe as well at home - Advised patient to go to the ER or return to office if the wound worsens or if constitutional  symptoms are present. -Patient to return to office next week for follow-up wound care Landis Martins, DPM

## 2020-05-12 ENCOUNTER — Encounter: Payer: Self-pay | Admitting: Sports Medicine

## 2020-05-12 ENCOUNTER — Ambulatory Visit (INDEPENDENT_AMBULATORY_CARE_PROVIDER_SITE_OTHER): Payer: BC Managed Care – PPO | Admitting: Sports Medicine

## 2020-05-12 ENCOUNTER — Other Ambulatory Visit: Payer: Self-pay

## 2020-05-12 DIAGNOSIS — L97511 Non-pressure chronic ulcer of other part of right foot limited to breakdown of skin: Secondary | ICD-10-CM

## 2020-05-12 DIAGNOSIS — E1142 Type 2 diabetes mellitus with diabetic polyneuropathy: Secondary | ICD-10-CM

## 2020-05-12 DIAGNOSIS — Z89431 Acquired absence of right foot: Secondary | ICD-10-CM

## 2020-05-12 DIAGNOSIS — M216X1 Other acquired deformities of right foot: Secondary | ICD-10-CM

## 2020-05-12 DIAGNOSIS — I739 Peripheral vascular disease, unspecified: Secondary | ICD-10-CM

## 2020-05-12 NOTE — Progress Notes (Signed)
Subjective: Adrian Bean is a 69 y.o. male patient seen in office for follow up evaluation of ulceration of the right foot amputation stump.  Reports he is doing good no issues.  He denies constitutional symptoms or any other pedal complaints at this time.  Fasting blood sugar does not recorded.   Patient Active Problem List   Diagnosis Date Noted  . Infected surgical wound 06/18/2018  . Peripheral polyneuropathy 06/18/2018  . Mixed dyslipidemia 06/17/2018  . Essential hypertension 12/12/2017  . Need for hepatitis C screening test 12/12/2017  . Screening for colon cancer 12/12/2017  . Diabetic polyneuropathy associated with type 2 diabetes mellitus (Adrian Bean) 09/17/2017  . 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 History of transmetatarsal amputation of right foot (Adrian Bean) 07/04/2017  . PAD (peripheral artery disease) (Adrian Bean) 06/02/2017  . Sepsis (Adrian Bean) 06/02/2017  . DM (diabetes mellitus), type 2 (Adrian Bean) 06/02/2017  . Tobacco abuse 06/02/2017  . Tick bite 01/22/2017  . Pain in limb 10/21/2011  . Swelling of limb 10/21/2011   Current Outpatient Medications on File Prior to Visit  Medication Sig Dispense Refill  . albuterol (PROVENTIL) (2.5 MG/3ML) 0.083% nebulizer solution Take 3 mLs (2.5 mg total) by nebulization every 4 (four) hours as needed for wheezing or shortness of breath. 75 mL 12  . Amino Acids-Protein Hydrolys (FEEDING SUPPLEMENT, PRO-STAT SUGAR FREE 64,) LIQD Take 30 mLs by mouth 2 (two) times daily. 900 mL 0  . aspirin EC 81 MG EC tablet Take 1 tablet (81 mg total) by mouth daily.    Marland Kitchen atorvastatin (LIPITOR) 10 MG tablet Take 10 mg by mouth daily.    Marland Kitchen atorvastatin (LIPITOR) 10 MG tablet Take 10 mg by mouth daily at 6 PM.    . bisacodyl (DULCOLAX) 10 MG suppository Place 1 suppository (10 mg total) rectally daily as needed for moderate constipation. 12 suppository 0  . ciprofloxacin (CIPRO) 500 MG tablet Take 1 tablet (500  mg total) by mouth 2 (two) times daily. 20 tablet 0  . clindamycin (CLEOCIN) 300 MG capsule     . clopidogrel (PLAVIX) 75 MG tablet Take 75 mg by mouth daily.    . collagenase (SANTYL) ointment Apply topically daily. 15 g 0  . feeding supplement, GLUCERNA SHAKE, (GLUCERNA SHAKE) LIQD Take 237 mLs by mouth 3 (three) times daily between meals.  0  . gabapentin (NEURONTIN) 300 MG capsule Take 300 mg by mouth 3 (three) times daily.    Marland Kitchen gabapentin (NEURONTIN) 300 MG capsule Take 600 mg by mouth 3 (three) times daily.    Marland Kitchen glimepiride (AMARYL) 2 MG tablet Take 2 mg by mouth daily with breakfast.    . glimepiride (AMARYL) 2 MG tablet Take 2 mg by mouth daily with breakfast.    . HEPARIN LOCK FLUSH IV Inject into the vein.    Marland Kitchen HYDROcodone-acetaminophen (NORCO) 10-325 MG tablet Take 1 tablet by mouth every 6 (six) hours as needed for moderate pain.    Marland Kitchen lisinopril (PRINIVIL,ZESTRIL) 10 MG tablet Take 10 mg by mouth daily.    Marland Kitchen lisinopril (PRINIVIL,ZESTRIL) 10 MG tablet Take 10 mg by mouth daily.    Marland Kitchen lisinopril-hydrochlorothiazide (ZESTORETIC) 20-25 MG tablet     . Multiple Vitamin (MULTIVITAMIN WITH MINERALS) TABS tablet Take 1 tablet by mouth daily.    . multivitamin-iron-minerals-folic acid (CENTRUM) chewable tablet Chew 1 tablet by mouth daily.    . nicotine (NICODERM CQ - DOSED IN MG/24 HOURS) 14 mg/24hr patch Place 1 patch (  14 mg total) onto the skin daily. 28 patch 0  . omega-3 acid ethyl esters (LOVAZA) 1 g capsule Take 1 g by mouth daily.    . Omega-3 Fatty Acids (FISH OIL PO) Take 1 tablet by mouth daily.    Marland Kitchen oxyCODONE-acetaminophen (PERCOCET) 10-325 MG tablet Take 1 tablet by mouth every 6 (six) hours as needed for pain. 40 tablet 0  . polyethylene glycol (MIRALAX / GLYCOLAX) packet Take 17 g by mouth daily as needed for mild constipation.    . terbinafine (LAMISIL) 250 MG tablet Take 250 mg by mouth daily.    . Vitamin D, Ergocalciferol, (DRISDOL) 1.25 MG (50000 UNIT) CAPS capsule Take  50,000 Units by mouth once a week.     No current facility-administered medications on file prior to visit.   No Known Allergies  No results found for this or any previous visit (from the past 2160 hour(s)).  Objective: There were no vitals filed for this visit.  General: Patient is awake, alert, oriented in no acute distress  Dermatology: Skin is warm and dry bilateral with a partial thickness ulceration present at Right amputation stump. Ulceration plantar stump that measures pre debridement measures 0. 2 x 0. 2 x 0.2 cm and post debridement measures  0.3 x 0.3 x0.3cm, same as previous with a granular base with increased macerated and keratotic margins. The ulceration does not probe to bone.  There is no malodor, no active drainage, no erythema, trace edema to stump on right.  No other acute signs of infection.  Vascular: Dorsalis Pedis pulse = 1/4 Bilateral,  Posterior Tibial pulse = 0/4 Bilateral,  Capillary Fill Time < 5 seconds, 1+ pitting edema.  Neurologic: Protective sensation absent bilateral.    Musculosketal: No pain with palpation to ulcerated area.  Status post right foot TMA.  No pain with compression to calves bilateral.  No results for input(s): GRAMSTAIN, LABORGA in the last 8760 hours.  Assessment and Plan:  Problem List Items Addressed This Visit      Cardiovascular and Mediastinum   PAD (peripheral artery disease) ([place])     Endocrine   Diabetic polyneuropathy associated with type 2 diabetes mellitus ([place])     Other   History of transmetatarsal amputation of right foot (Adrian Bean)    Other Visit Diagnoses    Skin ulcer of right foot, limited to breakdown of skin (Adrian Bean)    -  Primary   Acquired equinus deformity of right foot         -Re-examined patient and discussed the progression of the wound and treatment alternatives. -Excisionally dedbrided ulceration at right amputation stump to healthy bleeding borders removing nonviable tissue using a sterile chisel  blade. Wound measures post debridement as above. Wound was debrided to the level of the dermis with viable wound base exposed to promote healing. Hemostasis was achieved with manuel pressure.  Minimal bleeding noted.  Patient tolerated procedure well without any discomfort or need for anesthesia. -Applied skin protectant and foot miracle cream to surrounding skin and then to the wound bed CellerateRx collagen wound gel again this visit and ABD dressing, and dry dressing and Coban to the right foot.  Advised patient to leave intact until next week like previous to keep dressing clean dry and intact like previous -Continue with offloading padding in shoe as well at home like previous - Advised patient to go to the ER or return to office if the wound worsens or if constitutional symptoms are present. -Patient to return  to office next week for follow-up wound care Landis Martins, DPM

## 2020-05-19 ENCOUNTER — Other Ambulatory Visit: Payer: Self-pay

## 2020-05-19 ENCOUNTER — Ambulatory Visit (INDEPENDENT_AMBULATORY_CARE_PROVIDER_SITE_OTHER): Payer: BC Managed Care – PPO | Admitting: Sports Medicine

## 2020-05-19 ENCOUNTER — Encounter: Payer: Self-pay | Admitting: Sports Medicine

## 2020-05-19 DIAGNOSIS — E1142 Type 2 diabetes mellitus with diabetic polyneuropathy: Secondary | ICD-10-CM

## 2020-05-19 DIAGNOSIS — I739 Peripheral vascular disease, unspecified: Secondary | ICD-10-CM

## 2020-05-19 DIAGNOSIS — L97511 Non-pressure chronic ulcer of other part of right foot limited to breakdown of skin: Secondary | ICD-10-CM | POA: Diagnosis not present

## 2020-05-19 DIAGNOSIS — Z89431 Acquired absence of right foot: Secondary | ICD-10-CM

## 2020-05-19 DIAGNOSIS — M216X1 Other acquired deformities of right foot: Secondary | ICD-10-CM

## 2020-05-19 NOTE — Progress Notes (Signed)
Subjective: Adrian Bean is a 69 y.o. male patient seen in office for follow up evaluation of ulceration of the right foot amputation stump.  Reports he is doing good no issues but did notice a little bit more drainage through the bandage.  He denies constitutional symptoms or any other pedal complaints at this time.  Fasting blood sugar does not recorded.   Patient Active Problem List   Diagnosis Date Noted  . Infected surgical wound 06/18/2018  . Peripheral polyneuropathy 06/18/2018  . Mixed dyslipidemia 06/17/2018  . Essential hypertension 12/12/2017  . Need for hepatitis C screening test 12/12/2017  . Screening for colon cancer 12/12/2017  . Diabetic polyneuropathy associated with type 2 diabetes mellitus ([place]) 09/17/2017  . 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 History of transmetatarsal amputation of right foot ([place]) 07/04/2017  . PAD (peripheral artery disease) ([place]) 06/02/2017  . Sepsis ([place]) 06/02/2017  . DM (diabetes mellitus), type 2 ([place]) 06/02/2017  . Tobacco abuse 06/02/2017  . Tick bite 01/22/2017  . Pain in limb 10/21/2011  . Swelling of limb 10/21/2011   Current Outpatient Medications on File Prior to Visit  Medication Sig Dispense Refill  . albuterol (PROVENTIL) (2.5 MG/3ML) 0.083% nebulizer solution Take 3 mLs (2.5 mg total) by nebulization every 4 (four) hours as needed for wheezing or shortness of breath. 75 mL 12  . Amino Acids-Protein Hydrolys (FEEDING SUPPLEMENT, PRO-STAT SUGAR FREE 64,) LIQD Take 30 mLs by mouth 2 (two) times daily. 900 mL 0  . aspirin EC 81 MG EC tablet Take 1 tablet (81 mg total) by mouth daily.    Marland Kitchen atorvastatin (LIPITOR) 10 MG tablet Take 10 mg by mouth daily.    Marland Kitchen atorvastatin (LIPITOR) 10 MG tablet Take 10 mg by mouth daily at 6 PM.    . bisacodyl (DULCOLAX) 10 MG suppository Place 1 suppository (10 mg total) rectally daily as needed for moderate constipation. 12 suppository  0  . ciprofloxacin (CIPRO) 500 MG tablet Take 1 tablet (500 mg total) by mouth 2 (two) times daily. 20 tablet 0  . clindamycin (CLEOCIN) 300 MG capsule     . clopidogrel (PLAVIX) 75 MG tablet Take 75 mg by mouth daily.    . collagenase (SANTYL) ointment Apply topically daily. 15 g 0  . feeding supplement, GLUCERNA SHAKE, (GLUCERNA SHAKE) LIQD Take 237 mLs by mouth 3 (three) times daily between meals.  0  . gabapentin (NEURONTIN) 300 MG capsule Take 300 mg by mouth 3 (three) times daily.    Marland Kitchen gabapentin (NEURONTIN) 300 MG capsule Take 600 mg by mouth 3 (three) times daily.    Marland Kitchen glimepiride (AMARYL) 2 MG tablet Take 2 mg by mouth daily with breakfast.    . glimepiride (AMARYL) 2 MG tablet Take 2 mg by mouth daily with breakfast.    . HEPARIN LOCK FLUSH IV Inject into the vein.    Marland Kitchen HYDROcodone-acetaminophen (NORCO) 10-325 MG tablet Take 1 tablet by mouth every 6 (six) hours as needed for moderate pain.    Marland Kitchen lisinopril (PRINIVIL,ZESTRIL) 10 MG tablet Take 10 mg by mouth daily.    Marland Kitchen lisinopril (PRINIVIL,ZESTRIL) 10 MG tablet Take 10 mg by mouth daily.    Marland Kitchen lisinopril-hydrochlorothiazide (ZESTORETIC) 20-25 MG tablet     . Multiple Vitamin (MULTIVITAMIN WITH MINERALS) TABS tablet Take 1 tablet by mouth daily.    . multivitamin-iron-minerals-folic acid (CENTRUM) chewable tablet Chew 1 tablet by mouth daily.    . nicotine (NICODERM CQ -  DOSED IN MG/24 HOURS) 14 mg/24hr patch Place 1 patch (14 mg total) onto the skin daily. 28 patch 0  . omega-3 acid ethyl esters (LOVAZA) 1 g capsule Take 1 g by mouth daily.    . Omega-3 Fatty Acids (FISH OIL PO) Take 1 tablet by mouth daily.    Marland Kitchen oxyCODONE-acetaminophen (PERCOCET) 10-325 MG tablet Take 1 tablet by mouth every 6 (six) hours as needed for pain. 40 tablet 0  . polyethylene glycol (MIRALAX / GLYCOLAX) packet Take 17 g by mouth daily as needed for mild constipation.    . terbinafine (LAMISIL) 250 MG tablet Take 250 mg by mouth daily.    . Vitamin D,  Ergocalciferol, (DRISDOL) 1.25 MG (50000 UNIT) CAPS capsule Take 50,000 Units by mouth once a week.     No current facility-administered medications on file prior to visit.   No Known Allergies  No results found for this or any previous visit (from the past 2160 hour(s)).  Objective: There were no vitals filed for this visit.  General: Patient is awake, alert, oriented in no acute distress  Dermatology: Skin is warm and dry bilateral with a partial thickness ulceration present at Right amputation stump. Ulceration plantar stump that measures pre debridement measures 0.  3 x 0.  3 x 0.2 cm and post debridement measures  0. 4 x 0. 5 x0.3cm, slightly larger as previous with a granular base with increased macerated and keratotic margins. The ulceration does not probe to bone.  There is no malodor, no active drainage, no erythema, trace edema to stump on right.  No other acute signs of infection.  Vascular: Dorsalis Pedis pulse = 1/4 Bilateral,  Posterior Tibial pulse = 0/4 Bilateral,  Capillary Fill Time < 5 seconds, 1+ pitting edema.  Neurologic: Protective sensation absent bilateral.    Musculosketal: No pain with palpation to ulcerated area.  Status post right foot TMA.  No pain with compression to calves bilateral.  No results for input(s): GRAMSTAIN, LABORGA in the last 8760 hours.  Assessment and Plan:  Problem List Items Addressed This Visit      Cardiovascular and Mediastinum   PAD (peripheral artery disease) ([place])     Endocrine   Diabetic polyneuropathy associated with type 2 diabetes mellitus ([place])     Other   History of transmetatarsal amputation of right foot ([place])    Other Visit Diagnoses    Skin ulcer of right foot, limited to breakdown of skin ([place])    -  Primary   Acquired equinus deformity of right foot         -Re-examined patient and discussed the progression of the wound and treatment alternatives. -Excisionally dedbrided ulceration at right amputation stump  to healthy bleeding borders removing nonviable tissue using a sterile chisel blade. Wound measures post debridement as above. Wound was debrided to the level of the dermis with viable wound base exposed to promote healing. Hemostasis was achieved with manuel pressure.  Minimal bleeding noted.  Patient tolerated procedure well without any discomfort or need for anesthesia. -Applied Cavilon skin protectant and foot miracle cream to surrounding skin and then to the wound bed carousel Ag and CellerateRx collagen wound gel again this visit and ABD dressing, and dry dressing and Coban to the right foot.  Advised patient to leave intact until next week like previous to keep dressing clean dry and intact like previous -Continue with offloading padding in shoe as well at home like previous - Advised patient to go to  the ER or return to office if the wound worsens or if constitutional symptoms are present. -Patient to return to office next week for follow-up wound care Landis Martins, DPM

## 2020-05-24 ENCOUNTER — Other Ambulatory Visit: Payer: Self-pay

## 2020-05-24 ENCOUNTER — Encounter: Payer: Self-pay | Admitting: Sports Medicine

## 2020-05-24 ENCOUNTER — Ambulatory Visit (INDEPENDENT_AMBULATORY_CARE_PROVIDER_SITE_OTHER): Payer: BC Managed Care – PPO | Admitting: Sports Medicine

## 2020-05-24 DIAGNOSIS — E1142 Type 2 diabetes mellitus with diabetic polyneuropathy: Secondary | ICD-10-CM

## 2020-05-24 DIAGNOSIS — Z89431 Acquired absence of right foot: Secondary | ICD-10-CM

## 2020-05-24 DIAGNOSIS — L97511 Non-pressure chronic ulcer of other part of right foot limited to breakdown of skin: Secondary | ICD-10-CM

## 2020-05-24 DIAGNOSIS — I739 Peripheral vascular disease, unspecified: Secondary | ICD-10-CM

## 2020-05-24 DIAGNOSIS — M216X1 Other acquired deformities of right foot: Secondary | ICD-10-CM

## 2020-05-24 NOTE — Progress Notes (Signed)
Subjective: Adrian Bean is a 69 y.o. male patient seen in office for follow up evaluation of ulceration of the right foot amputation stump.  Reports he is doing good and denies any changes since last encounter on last week.  Fasting blood sugar does not recorded.   Patient Active Problem List   Diagnosis Date Noted  . Infected surgical wound 06/18/2018  . Peripheral polyneuropathy 06/18/2018  . Mixed dyslipidemia 06/17/2018  . Essential hypertension 12/12/2017  . Need for hepatitis C screening test 12/12/2017  . Screening for colon cancer 12/12/2017  . Diabetic polyneuropathy associated with type 2 diabetes mellitus ([place]) 09/17/2017  . 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 History of transmetatarsal amputation of right foot ([place]) 07/04/2017  . PAD (peripheral artery disease) ([place]) 06/02/2017  . Sepsis ([place]) 06/02/2017  . DM (diabetes mellitus), type 2 ([place]) 06/02/2017  . Tobacco abuse 06/02/2017  . Tick bite 01/22/2017  . Pain in limb 10/21/2011  . Swelling of limb 10/21/2011   Current Outpatient Medications on File Prior to Visit  Medication Sig Dispense Refill  . albuterol (PROVENTIL) (2.5 MG/3ML) 0.083% nebulizer solution Take 3 mLs (2.5 mg total) by nebulization every 4 (four) hours as needed for wheezing or shortness of breath. 75 mL 12  . Amino Acids-Protein Hydrolys (FEEDING SUPPLEMENT, PRO-STAT SUGAR FREE 64,) LIQD Take 30 mLs by mouth 2 (two) times daily. 900 mL 0  . aspirin EC 81 MG EC tablet Take 1 tablet (81 mg total) by mouth daily.    Marland Kitchen atorvastatin (LIPITOR) 10 MG tablet Take 10 mg by mouth daily.    Marland Kitchen atorvastatin (LIPITOR) 10 MG tablet Take 10 mg by mouth daily at 6 PM.    . bisacodyl (DULCOLAX) 10 MG suppository Place 1 suppository (10 mg total) rectally daily as needed for moderate constipation. 12 suppository 0  . ciprofloxacin (CIPRO) 500 MG tablet Take 1 tablet (500 mg total) by mouth 2 (two) times  daily. 20 tablet 0  . clindamycin (CLEOCIN) 300 MG capsule     . clopidogrel (PLAVIX) 75 MG tablet Take 75 mg by mouth daily.    . collagenase (SANTYL) ointment Apply topically daily. 15 g 0  . feeding supplement, GLUCERNA SHAKE, (GLUCERNA SHAKE) LIQD Take 237 mLs by mouth 3 (three) times daily between meals.  0  . gabapentin (NEURONTIN) 300 MG capsule Take 300 mg by mouth 3 (three) times daily.    Marland Kitchen gabapentin (NEURONTIN) 300 MG capsule Take 600 mg by mouth 3 (three) times daily.    Marland Kitchen glimepiride (AMARYL) 2 MG tablet Take 2 mg by mouth daily with breakfast.    . glimepiride (AMARYL) 2 MG tablet Take 2 mg by mouth daily with breakfast.    . HEPARIN LOCK FLUSH IV Inject into the vein.    Marland Kitchen HYDROcodone-acetaminophen (NORCO) 10-325 MG tablet Take 1 tablet by mouth every 6 (six) hours as needed for moderate pain.    Marland Kitchen lisinopril (PRINIVIL,ZESTRIL) 10 MG tablet Take 10 mg by mouth daily.    Marland Kitchen lisinopril (PRINIVIL,ZESTRIL) 10 MG tablet Take 10 mg by mouth daily.    Marland Kitchen lisinopril-hydrochlorothiazide (ZESTORETIC) 20-25 MG tablet     . Multiple Vitamin (MULTIVITAMIN WITH MINERALS) TABS tablet Take 1 tablet by mouth daily.    . multivitamin-iron-minerals-folic acid (CENTRUM) chewable tablet Chew 1 tablet by mouth daily.    . nicotine (NICODERM CQ - DOSED IN MG/24 HOURS) 14 mg/24hr patch Place 1 patch (14 mg total) onto the  skin daily. 28 patch 0  . omega-3 acid ethyl esters (LOVAZA) 1 g capsule Take 1 g by mouth daily.    . Omega-3 Fatty Acids (FISH OIL PO) Take 1 tablet by mouth daily.    Marland Kitchen oxyCODONE-acetaminophen (PERCOCET) 10-325 MG tablet Take 1 tablet by mouth every 6 (six) hours as needed for pain. 40 tablet 0  . polyethylene glycol (MIRALAX / GLYCOLAX) packet Take 17 g by mouth daily as needed for mild constipation.    . terbinafine (LAMISIL) 250 MG tablet Take 250 mg by mouth daily.    . Vitamin D, Ergocalciferol, (DRISDOL) 1.25 MG (50000 UNIT) CAPS capsule Take 50,000 Units by mouth once a week.      No current facility-administered medications on file prior to visit.   No Known Allergies  No results found for this or any previous visit (from the past 2160 hour(s)).  Objective: There were no vitals filed for this visit.  General: Patient is awake, alert, oriented in no acute distress  Dermatology: Skin is warm and dry bilateral with a partial thickness ulceration present at Right amputation stump. Ulceration plantar stump that measures pre debridement measures 0.  3 x 0.  3 x 0.2 cm and post debridement measures  0. 3 x 0. 4 x0.3cm, similar as previous with a granular base with decreased macerated and keratotic margins. The ulceration does not probe to bone.  There is no malodor, no active drainage, no erythema, trace edema to stump on right.  No other acute signs of infection.  Vascular: Dorsalis Pedis pulse = 1/4 Bilateral,  Posterior Tibial pulse = 0/4 Bilateral,  Capillary Fill Time < 5 seconds, 1+ pitting edema.  Neurologic: Protective sensation absent bilateral.    Musculosketal: No pain with palpation to ulcerated area.  Status post right foot TMA.  No pain with compression to calves bilateral.  No results for input(s): GRAMSTAIN, LABORGA in the last 8760 hours.  Assessment and Plan:  Problem List Items Addressed This Visit      Cardiovascular and Mediastinum   PAD (peripheral artery disease) ([place])     Endocrine   Diabetic polyneuropathy associated with type 2 diabetes mellitus ([place])     Other   History of transmetatarsal amputation of right foot ([place])    Other Visit Diagnoses    Skin ulcer of right foot, limited to breakdown of skin ([place])    -  Primary   Acquired equinus deformity of right foot         -Re-examined patient and discussed the progression of the wound and treatment alternatives. -Excisionally dedbrided ulceration at right amputation stump to healthy bleeding borders removing nonviable tissue using a sterile chisel blade. Wound measures post  debridement as above. Wound was debrided to the level of the dermis with viable wound base exposed to promote healing. Hemostasis was achieved with manuel pressure.  Minimal bleeding noted.  Patient tolerated procedure well without any discomfort or need for anesthesia. -Applied  foot miracle cream to surrounding skin and then to the wound bed Kerracel Ag and CellerateRx collagen wound gel again this visit and ABD dressing, and dry dressing and Coban to the right foot.  Advised patient to leave intact until next week like previous to keep dressing clean dry and intact like previous -Continue with offloading padding in shoe as well at home like previous - Advised patient to go to the ER or return to office if the wound worsens or if constitutional symptoms are present. -Patient to return  to office next week for follow-up wound care Landis Martins, DPM

## 2020-06-02 ENCOUNTER — Ambulatory Visit (INDEPENDENT_AMBULATORY_CARE_PROVIDER_SITE_OTHER): Payer: BC Managed Care – PPO | Admitting: Sports Medicine

## 2020-06-02 ENCOUNTER — Other Ambulatory Visit: Payer: Self-pay

## 2020-06-02 ENCOUNTER — Encounter: Payer: Self-pay | Admitting: Sports Medicine

## 2020-06-02 DIAGNOSIS — M216X1 Other acquired deformities of right foot: Secondary | ICD-10-CM

## 2020-06-02 DIAGNOSIS — Z89431 Acquired absence of right foot: Secondary | ICD-10-CM

## 2020-06-02 DIAGNOSIS — L97511 Non-pressure chronic ulcer of other part of right foot limited to breakdown of skin: Secondary | ICD-10-CM

## 2020-06-02 DIAGNOSIS — E1142 Type 2 diabetes mellitus with diabetic polyneuropathy: Secondary | ICD-10-CM

## 2020-06-02 DIAGNOSIS — I739 Peripheral vascular disease, unspecified: Secondary | ICD-10-CM

## 2020-06-02 NOTE — Progress Notes (Signed)
Subjective: Adrian Bean is a 69 y.o. male patient seen in office for follow up evaluation of ulceration of the right foot amputation stump.  Reports he is doing ok still has a sore with drainage not sure why because he has been doing the best that he can to stay off of his foot.  Fasting blood sugar does not recorded.   Patient Active Problem List   Diagnosis Date Noted  . Infected surgical wound 06/18/2018  . Peripheral polyneuropathy 06/18/2018  . Mixed dyslipidemia 06/17/2018  . Essential hypertension 12/12/2017  . Need for hepatitis C screening test 12/12/2017  . Screening for colon cancer 12/12/2017  . Diabetic polyneuropathy associated with type 2 diabetes mellitus ([place]) 09/17/2017  . 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 History of transmetatarsal amputation of right foot ([place]) 07/04/2017  . PAD (peripheral artery disease) ([place]) 06/02/2017  . Sepsis ([place]) 06/02/2017  . DM (diabetes mellitus), type 2 ([place]) 06/02/2017  . Tobacco abuse 06/02/2017  . Tick bite 01/22/2017  . Pain in limb 10/21/2011  . Swelling of limb 10/21/2011   Current Outpatient Medications on File Prior to Visit  Medication Sig Dispense Refill  . albuterol (PROVENTIL) (2.5 MG/3ML) 0.083% nebulizer solution Take 3 mLs (2.5 mg total) by nebulization every 4 (four) hours as needed for wheezing or shortness of breath. 75 mL 12  . Amino Acids-Protein Hydrolys (FEEDING SUPPLEMENT, PRO-STAT SUGAR FREE 64,) LIQD Take 30 mLs by mouth 2 (two) times daily. 900 mL 0  . aspirin EC 81 MG EC tablet Take 1 tablet (81 mg total) by mouth daily.    Marland Kitchen atorvastatin (LIPITOR) 10 MG tablet Take 10 mg by mouth daily.    Marland Kitchen atorvastatin (LIPITOR) 10 MG tablet Take 10 mg by mouth daily at 6 PM.    . bisacodyl (DULCOLAX) 10 MG suppository Place 1 suppository (10 mg total) rectally daily as needed for moderate constipation. 12 suppository 0  . ciprofloxacin (CIPRO) 500 MG  tablet Take 1 tablet (500 mg total) by mouth 2 (two) times daily. 20 tablet 0  . clindamycin (CLEOCIN) 300 MG capsule     . clopidogrel (PLAVIX) 75 MG tablet Take 75 mg by mouth daily.    . collagenase (SANTYL) ointment Apply topically daily. 15 g 0  . feeding supplement, GLUCERNA SHAKE, (GLUCERNA SHAKE) LIQD Take 237 mLs by mouth 3 (three) times daily between meals.  0  . gabapentin (NEURONTIN) 300 MG capsule Take 300 mg by mouth 3 (three) times daily.    Marland Kitchen gabapentin (NEURONTIN) 300 MG capsule Take 600 mg by mouth 3 (three) times daily.    Marland Kitchen glimepiride (AMARYL) 2 MG tablet Take 2 mg by mouth daily with breakfast.    . glimepiride (AMARYL) 2 MG tablet Take 2 mg by mouth daily with breakfast.    . HEPARIN LOCK FLUSH IV Inject into the vein.    Marland Kitchen HYDROcodone-acetaminophen (NORCO) 10-325 MG tablet Take 1 tablet by mouth every 6 (six) hours as needed for moderate pain.    Marland Kitchen lisinopril (PRINIVIL,ZESTRIL) 10 MG tablet Take 10 mg by mouth daily.    Marland Kitchen lisinopril (PRINIVIL,ZESTRIL) 10 MG tablet Take 10 mg by mouth daily.    Marland Kitchen lisinopril-hydrochlorothiazide (ZESTORETIC) 20-25 MG tablet     . Multiple Vitamin (MULTIVITAMIN WITH MINERALS) TABS tablet Take 1 tablet by mouth daily.    . multivitamin-iron-minerals-folic acid (CENTRUM) chewable tablet Chew 1 tablet by mouth daily.    . nicotine (NICODERM CQ -  DOSED IN MG/24 HOURS) 14 mg/24hr patch Place 1 patch (14 mg total) onto the skin daily. 28 patch 0  . omega-3 acid ethyl esters (LOVAZA) 1 g capsule Take 1 g by mouth daily.    . Omega-3 Fatty Acids (FISH OIL PO) Take 1 tablet by mouth daily.    Marland Kitchen oxyCODONE-acetaminophen (PERCOCET) 10-325 MG tablet Take 1 tablet by mouth every 6 (six) hours as needed for pain. 40 tablet 0  . polyethylene glycol (MIRALAX / GLYCOLAX) packet Take 17 g by mouth daily as needed for mild constipation.    . terbinafine (LAMISIL) 250 MG tablet Take 250 mg by mouth daily.    . Vitamin D, Ergocalciferol, (DRISDOL) 1.25 MG (50000  UNIT) CAPS capsule Take 50,000 Units by mouth once a week.     No current facility-administered medications on file prior to visit.   No Known Allergies  No results found for this or any previous visit (from the past 2160 hour(s)).  Objective: There were no vitals filed for this visit.  General: Patient is awake, alert, oriented in no acute distress  Dermatology: Skin is warm and dry bilateral with a partial thickness ulceration present at Right amputation stump. Ulceration plantar stump that measures pre debridement measures 0.  3 x 0.  3 x 0.2 cm and post debridement measures  0. 4x 0. 5x0.3cm, similar as previous with a granular base with decreased macerated and keratotic margins. The ulceration does not probe to bone.  There is no malodor, no active drainage, no erythema, trace edema to stump on right.  No other acute signs of infection.  Vascular: Dorsalis Pedis pulse = 1/4 Bilateral,  Posterior Tibial pulse = 0/4 Bilateral,  Capillary Fill Time < 5 seconds, 1+ pitting edema.  Neurologic: Protective sensation absent bilateral.    Musculosketal: No pain with palpation to ulcerated area.  Status post right foot TMA.  No pain with compression to calves bilateral.  No results for input(s): GRAMSTAIN, LABORGA in the last 8760 hours.  Assessment and Plan:  Problem List Items Addressed This Visit      Cardiovascular and Mediastinum   PAD (peripheral artery disease) ([place])     Endocrine   Diabetic polyneuropathy associated with type 2 diabetes mellitus ([place])     Other   History of transmetatarsal amputation of right foot ([place])    Other Visit Diagnoses    Skin ulcer of right foot, limited to breakdown of skin ([place])    -  Primary   Acquired equinus deformity of right foot         -Re-examined patient and discussed the progression of the wound and treatment alternatives. -Excisionally dedbrided ulceration at right amputation stump to healthy bleeding borders removing nonviable  tissue using a sterile chisel blade. Wound measures post debridement as above. Wound was debrided to the level of the dermis with viable wound base exposed to promote healing. Hemostasis was achieved with manuel pressure.  Minimal bleeding noted.  Patient tolerated procedure well without any discomfort or need for anesthesia. -Applied  foot miracle cream to surrounding skin and then to the wound bed silver nitrate and Iodosorb wound gel and ABD dressing, and dry dressing and Coban to the right foot.  Advised patient to leave intact until next week like previous and  to keep dressing clean dry and intact -Continue with offloading padding in shoe as well at home like previous and weight bearing to heel only  - Advised patient to go to the ER or  return to office if the wound worsens or if constitutional symptoms are present. -Patient to return to office next week for follow-up wound care Landis Martins, DPM

## 2020-06-09 ENCOUNTER — Ambulatory Visit (INDEPENDENT_AMBULATORY_CARE_PROVIDER_SITE_OTHER): Payer: BC Managed Care – PPO | Admitting: Sports Medicine

## 2020-06-09 ENCOUNTER — Encounter: Payer: Self-pay | Admitting: Sports Medicine

## 2020-06-09 ENCOUNTER — Other Ambulatory Visit: Payer: Self-pay

## 2020-06-09 DIAGNOSIS — I739 Peripheral vascular disease, unspecified: Secondary | ICD-10-CM

## 2020-06-09 DIAGNOSIS — Z89431 Acquired absence of right foot: Secondary | ICD-10-CM

## 2020-06-09 DIAGNOSIS — E1142 Type 2 diabetes mellitus with diabetic polyneuropathy: Secondary | ICD-10-CM

## 2020-06-09 DIAGNOSIS — L97511 Non-pressure chronic ulcer of other part of right foot limited to breakdown of skin: Secondary | ICD-10-CM | POA: Diagnosis not present

## 2020-06-09 DIAGNOSIS — S90821A Blister (nonthermal), right foot, initial encounter: Secondary | ICD-10-CM

## 2020-06-09 DIAGNOSIS — M216X1 Other acquired deformities of right foot: Secondary | ICD-10-CM

## 2020-06-09 NOTE — Progress Notes (Signed)
Subjective: Adrian Bean is a 69 y.o. male patient seen in office for follow up evaluation of ulceration of the right foot amputation stump.  Reports he is doing ok but has more drainage this visit even drained into his bedroom slippers.  Fasting blood sugar does not recorded.   Patient Active Problem List   Diagnosis Date Noted  . Infected surgical wound 06/18/2018  . Peripheral polyneuropathy 06/18/2018  . Mixed dyslipidemia 06/17/2018  . Essential hypertension 12/12/2017  . Need for hepatitis C screening test 12/12/2017  . Screening for colon cancer 12/12/2017  . Diabetic polyneuropathy associated with type 2 diabetes mellitus ([place]) 09/17/2017  . 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 History of transmetatarsal amputation of right foot ([place]) 07/04/2017  . PAD (peripheral artery disease) ([place]) 06/02/2017  . Sepsis ([place]) 06/02/2017  . DM (diabetes mellitus), type 2 ([place]) 06/02/2017  . Tobacco abuse 06/02/2017  . Tick bite 01/22/2017  . Pain in limb 10/21/2011  . Swelling of limb 10/21/2011   Current Outpatient Medications on File Prior to Visit  Medication Sig Dispense Refill  . albuterol (PROVENTIL) (2.5 MG/3ML) 0.083% nebulizer solution Take 3 mLs (2.5 mg total) by nebulization every 4 (four) hours as needed for wheezing or shortness of breath. 75 mL 12  . Amino Acids-Protein Hydrolys (FEEDING SUPPLEMENT, PRO-STAT SUGAR FREE 64,) LIQD Take 30 mLs by mouth 2 (two) times daily. 900 mL 0  . aspirin EC 81 MG EC tablet Take 1 tablet (81 mg total) by mouth daily.    Marland Kitchen atorvastatin (LIPITOR) 10 MG tablet Take 10 mg by mouth daily.    Marland Kitchen atorvastatin (LIPITOR) 10 MG tablet Take 10 mg by mouth daily at 6 PM.    . bisacodyl (DULCOLAX) 10 MG suppository Place 1 suppository (10 mg total) rectally daily as needed for moderate constipation. 12 suppository 0  . ciprofloxacin (CIPRO) 500 MG tablet Take 1 tablet (500 mg total) by mouth  2 (two) times daily. 20 tablet 0  . clindamycin (CLEOCIN) 300 MG capsule     . clopidogrel (PLAVIX) 75 MG tablet Take 75 mg by mouth daily.    . collagenase (SANTYL) ointment Apply topically daily. 15 g 0  . feeding supplement, GLUCERNA SHAKE, (GLUCERNA SHAKE) LIQD Take 237 mLs by mouth 3 (three) times daily between meals.  0  . gabapentin (NEURONTIN) 300 MG capsule Take 300 mg by mouth 3 (three) times daily.    Marland Kitchen gabapentin (NEURONTIN) 300 MG capsule Take 600 mg by mouth 3 (three) times daily.    Marland Kitchen glimepiride (AMARYL) 2 MG tablet Take 2 mg by mouth daily with breakfast.    . glimepiride (AMARYL) 2 MG tablet Take 2 mg by mouth daily with breakfast.    . HEPARIN LOCK FLUSH IV Inject into the vein.    Marland Kitchen HYDROcodone-acetaminophen (NORCO) 10-325 MG tablet Take 1 tablet by mouth every 6 (six) hours as needed for moderate pain.    Marland Kitchen lisinopril (PRINIVIL,ZESTRIL) 10 MG tablet Take 10 mg by mouth daily.    Marland Kitchen lisinopril (PRINIVIL,ZESTRIL) 10 MG tablet Take 10 mg by mouth daily.    Marland Kitchen lisinopril-hydrochlorothiazide (ZESTORETIC) 20-25 MG tablet     . Multiple Vitamin (MULTIVITAMIN WITH MINERALS) TABS tablet Take 1 tablet by mouth daily.    . multivitamin-iron-minerals-folic acid (CENTRUM) chewable tablet Chew 1 tablet by mouth daily.    . nicotine (NICODERM CQ - DOSED IN MG/24 HOURS) 14 mg/24hr patch Place 1 patch (14 mg total)  onto the skin daily. 28 patch 0  . omega-3 acid ethyl esters (LOVAZA) 1 g capsule Take 1 g by mouth daily.    . Omega-3 Fatty Acids (FISH OIL PO) Take 1 tablet by mouth daily.    Marland Kitchen oxyCODONE-acetaminophen (PERCOCET) 10-325 MG tablet Take 1 tablet by mouth every 6 (six) hours as needed for pain. 40 tablet 0  . polyethylene glycol (MIRALAX / GLYCOLAX) packet Take 17 g by mouth daily as needed for mild constipation.    . terbinafine (LAMISIL) 250 MG tablet Take 250 mg by mouth daily.    . Vitamin D, Ergocalciferol, (DRISDOL) 1.25 MG (50000 UNIT) CAPS capsule Take 50,000 Units by mouth  once a week.     No current facility-administered medications on file prior to visit.   No Known Allergies  No results found for this or any previous visit (from the past 2160 hour(s)).  Objective: There were no vitals filed for this visit.  General: Patient is awake, alert, oriented in no acute distress  Dermatology: Skin is warm and dry bilateral with a partial thickness ulceration present at Right amputation stump. Ulceration plantar stump that measures pre debridement measures 0.2 x 0.  3 x 0.2 cm and post debridement measures  0. 3x 0. 5x0.3cm, similar as previous with a granular base with decreased macerated and keratotic margins. The ulceration does not probe to bone.  There is no malodor, no active drainage however there is an increased drainage noted from a blister at the medial stump site.  No erythema, trace edema to stump on right.  No other acute signs of infection.  Vascular: Dorsalis Pedis pulse = 1/4 Bilateral,  Posterior Tibial pulse = 0/4 Bilateral,  Capillary Fill Time < 5 seconds, 1+ pitting edema.  Neurologic: Protective sensation absent bilateral.    Musculosketal: No pain with palpation to ulcerated area.  Status post right foot TMA.  No pain with compression to calves bilateral.  No results for input(s): GRAMSTAIN, LABORGA in the last 8760 hours.  Assessment and Plan:  Problem List Items Addressed This Visit      Cardiovascular and Mediastinum   PAD (peripheral artery disease) ([place])     Endocrine   Diabetic polyneuropathy associated with type 2 diabetes mellitus ([place])     Other   History of transmetatarsal amputation of right foot ([place])    Other Visit Diagnoses    Skin ulcer of right foot, limited to breakdown of skin ([place])    -  Primary   Acquired equinus deformity of right foot       Blister of right foot without infection, initial encounter         -Re-examined patient and discussed the progression of the wound and treatment  alternatives. -Excisionally dedbrided ulceration at right amputation stump to healthy bleeding borders removing nonviable tissue using a sterile chisel blade. Wound measures post debridement as above. Wound was debrided to the level of the dermis with viable wound base exposed to promote healing. Hemostasis was achieved with manuel pressure.  Minimal bleeding noted.  Patient tolerated procedure well without any discomfort or need for anesthesia. -Applied  foot miracle cream to surrounding skin and then to the wound bed silver nitrate to the plantar wound into the new blister and ABD dressing, and dry dressing and Coban to the right foot.  Advised patient to leave intact until next week like previous and  to keep dressing clean dry and intact -Continue with offloading padding in shoe as well at  home like previous and weight bearing to heel only  - Advised patient to go to the ER or return to office if the wound worsens or if constitutional symptoms are present. -Patient to return to office next week for follow-up wound care Landis Martins, DPM

## 2020-06-16 ENCOUNTER — Other Ambulatory Visit: Payer: Self-pay

## 2020-06-16 ENCOUNTER — Ambulatory Visit (INDEPENDENT_AMBULATORY_CARE_PROVIDER_SITE_OTHER): Payer: BC Managed Care – PPO | Admitting: Sports Medicine

## 2020-06-16 ENCOUNTER — Encounter: Payer: Self-pay | Admitting: Sports Medicine

## 2020-06-16 DIAGNOSIS — M216X1 Other acquired deformities of right foot: Secondary | ICD-10-CM

## 2020-06-16 DIAGNOSIS — E1142 Type 2 diabetes mellitus with diabetic polyneuropathy: Secondary | ICD-10-CM

## 2020-06-16 DIAGNOSIS — Z89431 Acquired absence of right foot: Secondary | ICD-10-CM

## 2020-06-16 DIAGNOSIS — L97511 Non-pressure chronic ulcer of other part of right foot limited to breakdown of skin: Secondary | ICD-10-CM | POA: Diagnosis not present

## 2020-06-16 DIAGNOSIS — I739 Peripheral vascular disease, unspecified: Secondary | ICD-10-CM

## 2020-06-16 NOTE — Progress Notes (Signed)
Subjective: Adrian Bean is a 69 y.o. male patient seen in office for follow up evaluation of ulceration of the right foot amputation stump.  Reports he is no issues since last week.  Reports that he did have to change his primary doctor appointment to accommodate the appointment for today and got it rescheduled for January 6.  Fasting blood sugar does not recorded.   Patient Active Problem List   Diagnosis Date Noted  . Infected surgical wound 06/18/2018  . Peripheral polyneuropathy 06/18/2018  . Mixed dyslipidemia 06/17/2018  . Essential hypertension 12/12/2017  . Need for hepatitis C screening test 12/12/2017  . Screening for colon cancer 12/12/2017  . Diabetic polyneuropathy associated with type 2 diabetes mellitus ([place]) 09/17/2017  . 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 History of transmetatarsal amputation of right foot ([place]) 07/04/2017  . PAD (peripheral artery disease) ([place]) 06/02/2017  . Sepsis ([place]) 06/02/2017  . DM (diabetes mellitus), type 2 ([place]) 06/02/2017  . Tobacco abuse 06/02/2017  . Tick bite 01/22/2017  . Pain in limb 10/21/2011  . Swelling of limb 10/21/2011   Current Outpatient Medications on File Prior to Visit  Medication Sig Dispense Refill  . albuterol (PROVENTIL) (2.5 MG/3ML) 0.083% nebulizer solution Take 3 mLs (2.5 mg total) by nebulization every 4 (four) hours as needed for wheezing or shortness of breath. 75 mL 12  . Amino Acids-Protein Hydrolys (FEEDING SUPPLEMENT, PRO-STAT SUGAR FREE 64,) LIQD Take 30 mLs by mouth 2 (two) times daily. 900 mL 0  . aspirin EC 81 MG EC tablet Take 1 tablet (81 mg total) by mouth daily.    Marland Kitchen atorvastatin (LIPITOR) 10 MG tablet Take 10 mg by mouth daily.    Marland Kitchen atorvastatin (LIPITOR) 10 MG tablet Take 10 mg by mouth daily at 6 PM.    . bisacodyl (DULCOLAX) 10 MG suppository Place 1 suppository (10 mg total) rectally daily as needed for moderate constipation. 12  suppository 0  . ciprofloxacin (CIPRO) 500 MG tablet Take 1 tablet (500 mg total) by mouth 2 (two) times daily. 20 tablet 0  . clindamycin (CLEOCIN) 300 MG capsule     . clopidogrel (PLAVIX) 75 MG tablet Take 75 mg by mouth daily.    . collagenase (SANTYL) ointment Apply topically daily. 15 g 0  . feeding supplement, GLUCERNA SHAKE, (GLUCERNA SHAKE) LIQD Take 237 mLs by mouth 3 (three) times daily between meals.  0  . gabapentin (NEURONTIN) 300 MG capsule Take 300 mg by mouth 3 (three) times daily.    Marland Kitchen gabapentin (NEURONTIN) 300 MG capsule Take 600 mg by mouth 3 (three) times daily.    Marland Kitchen glimepiride (AMARYL) 2 MG tablet Take 2 mg by mouth daily with breakfast.    . glimepiride (AMARYL) 2 MG tablet Take 2 mg by mouth daily with breakfast.    . HEPARIN LOCK FLUSH IV Inject into the vein.    Marland Kitchen HYDROcodone-acetaminophen (NORCO) 10-325 MG tablet Take 1 tablet by mouth every 6 (six) hours as needed for moderate pain.    Marland Kitchen lisinopril (PRINIVIL,ZESTRIL) 10 MG tablet Take 10 mg by mouth daily.    Marland Kitchen lisinopril (PRINIVIL,ZESTRIL) 10 MG tablet Take 10 mg by mouth daily.    Marland Kitchen lisinopril-hydrochlorothiazide (ZESTORETIC) 20-25 MG tablet     . Multiple Vitamin (MULTIVITAMIN WITH MINERALS) TABS tablet Take 1 tablet by mouth daily.    . multivitamin-iron-minerals-folic acid (CENTRUM) chewable tablet Chew 1 tablet by mouth daily.    . nicotine (  NICODERM CQ - DOSED IN MG/24 HOURS) 14 mg/24hr patch Place 1 patch (14 mg total) onto the skin daily. 28 patch 0  . omega-3 acid ethyl esters (LOVAZA) 1 g capsule Take 1 g by mouth daily.    . Omega-3 Fatty Acids (FISH OIL PO) Take 1 tablet by mouth daily.    Marland Kitchen oxyCODONE-acetaminophen (PERCOCET) 10-325 MG tablet Take 1 tablet by mouth every 6 (six) hours as needed for pain. 40 tablet 0  . polyethylene glycol (MIRALAX / GLYCOLAX) packet Take 17 g by mouth daily as needed for mild constipation.    . terbinafine (LAMISIL) 250 MG tablet Take 250 mg by mouth daily.    .  Vitamin D, Ergocalciferol, (DRISDOL) 1.25 MG (50000 UNIT) CAPS capsule Take 50,000 Units by mouth once a week.     No current facility-administered medications on file prior to visit.   No Known Allergies  No results found for this or any previous visit (from the past 2160 hour(s)).  Objective: There were no vitals filed for this visit.  General: Patient is awake, alert, oriented in no acute distress  Dermatology: Skin is warm and dry bilateral with a partial thickness ulceration present at Right amputation stump. Ulceration plantar stump that measures pre debridement measures 0.3 x 0.  3 x 0.2 cm and post debridement measures  0. 3x 0. 5x0.2cm, slightly smaller than last visit with a granular base with decreased macerated and keratotic margins. The ulceration does not probe to bone.  There is no malodor, no active drainage.  Previous blister at the medial stump site is resolved.  No erythema, trace edema to stump on right.  No other acute signs of infection.  Vascular: Dorsalis Pedis pulse = 1/4 Bilateral,  Posterior Tibial pulse = 0/4 Bilateral,  Capillary Fill Time < 5 seconds, 1+ pitting edema.  Neurologic: Protective sensation absent bilateral.    Musculosketal: No pain with palpation to ulcerated area.  Status post right foot TMA.  No pain with compression to calves bilateral.  No results for input(s): GRAMSTAIN, LABORGA in the last 8760 hours.  Assessment and Plan:  Problem List Items Addressed This Visit      Cardiovascular and Mediastinum   PAD (peripheral artery disease) ([place])     Endocrine   Diabetic polyneuropathy associated with type 2 diabetes mellitus ([place])     Other   History of transmetatarsal amputation of right foot ([place])    Other Visit Diagnoses    Skin ulcer of right foot, limited to breakdown of skin ([place])    -  Primary   Acquired equinus deformity of right foot         -Re-examined patient and discussed the progression of the wound and treatment  alternatives. -Excisionally dedbrided ulceration at right amputation stump to healthy bleeding borders removing nonviable tissue using a sterile chisel blade. Wound measures post debridement as above. Wound was debrided to the level of the dermis with viable wound base exposed to promote healing. Hemostasis was achieved with manuel pressure.  Minimal bleeding noted.  Patient tolerated procedure well without any discomfort or need for anesthesia. -Applied  foot miracle cream to surrounding skin and then to the plantar wound bed applied silver nitrate and ABD pad and dry dressing and Coban to the right foot.  Advised patient to leave intact until next week -Continue with offloading padding in shoe with weightbearing only to heel - Advised patient to go to the ER or return to office if the wound worsens  or if constitutional symptoms are present. -Patient to return to office next week for follow-up wound care Landis Martins, DPM

## 2020-06-21 ENCOUNTER — Other Ambulatory Visit: Payer: Self-pay

## 2020-06-21 ENCOUNTER — Ambulatory Visit (INDEPENDENT_AMBULATORY_CARE_PROVIDER_SITE_OTHER): Payer: BC Managed Care – PPO | Admitting: Sports Medicine

## 2020-06-21 ENCOUNTER — Encounter: Payer: Self-pay | Admitting: Sports Medicine

## 2020-06-21 DIAGNOSIS — M216X1 Other acquired deformities of right foot: Secondary | ICD-10-CM

## 2020-06-21 DIAGNOSIS — Z89431 Acquired absence of right foot: Secondary | ICD-10-CM

## 2020-06-21 DIAGNOSIS — E1142 Type 2 diabetes mellitus with diabetic polyneuropathy: Secondary | ICD-10-CM

## 2020-06-21 DIAGNOSIS — L97511 Non-pressure chronic ulcer of other part of right foot limited to breakdown of skin: Secondary | ICD-10-CM

## 2020-06-21 NOTE — Progress Notes (Signed)
Subjective: Adrian Bean is a 69 y.o. male patient seen in office for follow up evaluation of ulceration of the right foot amputation stump.  Reports he is doing good denies nausea vomiting fever chills or any constitutional symptoms at this time.  Has been able to keep dressing clean dry and intact and has not noticed any bleeding or strikethrough.  Fasting blood sugar does not recorded.   Patient Active Problem List   Diagnosis Date Noted  . Infected surgical wound 06/18/2018  . Peripheral polyneuropathy 06/18/2018  . Mixed dyslipidemia 06/17/2018  . Essential hypertension 12/12/2017  . Need for hepatitis C screening test 12/12/2017  . Screening for colon cancer 12/12/2017  . Diabetic polyneuropathy associated with type 2 diabetes mellitus ([place]) 09/17/2017  . 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 History of transmetatarsal amputation of right foot ([place]) 07/04/2017  . PAD (peripheral artery disease) ([place]) 06/02/2017  . Sepsis ([place]) 06/02/2017  . DM (diabetes mellitus), type 2 ([place]) 06/02/2017  . Tobacco abuse 06/02/2017  . Tick bite 01/22/2017  . Pain in limb 10/21/2011  . Swelling of limb 10/21/2011   Current Outpatient Medications on File Prior to Visit  Medication Sig Dispense Refill  . albuterol (PROVENTIL) (2.5 MG/3ML) 0.083% nebulizer solution Take 3 mLs (2.5 mg total) by nebulization every 4 (four) hours as needed for wheezing or shortness of breath. 75 mL 12  . Amino Acids-Protein Hydrolys (FEEDING SUPPLEMENT, PRO-STAT SUGAR FREE 64,) LIQD Take 30 mLs by mouth 2 (two) times daily. 900 mL 0  . aspirin EC 81 MG EC tablet Take 1 tablet (81 mg total) by mouth daily.    Marland Kitchen atorvastatin (LIPITOR) 10 MG tablet Take 10 mg by mouth daily.    Marland Kitchen atorvastatin (LIPITOR) 10 MG tablet Take 10 mg by mouth daily at 6 PM.    . bisacodyl (DULCOLAX) 10 MG suppository Place 1 suppository (10 mg total) rectally daily as needed for  moderate constipation. 12 suppository 0  . ciprofloxacin (CIPRO) 500 MG tablet Take 1 tablet (500 mg total) by mouth 2 (two) times daily. 20 tablet 0  . clindamycin (CLEOCIN) 300 MG capsule     . clopidogrel (PLAVIX) 75 MG tablet Take 75 mg by mouth daily.    . collagenase (SANTYL) ointment Apply topically daily. 15 g 0  . feeding supplement, GLUCERNA SHAKE, (GLUCERNA SHAKE) LIQD Take 237 mLs by mouth 3 (three) times daily between meals.  0  . gabapentin (NEURONTIN) 300 MG capsule Take 300 mg by mouth 3 (three) times daily.    Marland Kitchen gabapentin (NEURONTIN) 300 MG capsule Take 600 mg by mouth 3 (three) times daily.    Marland Kitchen glimepiride (AMARYL) 2 MG tablet Take 2 mg by mouth daily with breakfast.    . glimepiride (AMARYL) 2 MG tablet Take 2 mg by mouth daily with breakfast.    . HEPARIN LOCK FLUSH IV Inject into the vein.    Marland Kitchen HYDROcodone-acetaminophen (NORCO) 10-325 MG tablet Take 1 tablet by mouth every 6 (six) hours as needed for moderate pain.    Marland Kitchen lisinopril (PRINIVIL,ZESTRIL) 10 MG tablet Take 10 mg by mouth daily.    Marland Kitchen lisinopril (PRINIVIL,ZESTRIL) 10 MG tablet Take 10 mg by mouth daily.    Marland Kitchen lisinopril-hydrochlorothiazide (ZESTORETIC) 20-25 MG tablet     . Multiple Vitamin (MULTIVITAMIN WITH MINERALS) TABS tablet Take 1 tablet by mouth daily.    . multivitamin-iron-minerals-folic acid (CENTRUM) chewable tablet Chew 1 tablet by mouth daily.    Marland Kitchen  nicotine (NICODERM CQ - DOSED IN MG/24 HOURS) 14 mg/24hr patch Place 1 patch (14 mg total) onto the skin daily. 28 patch 0  . omega-3 acid ethyl esters (LOVAZA) 1 g capsule Take 1 g by mouth daily.    . Omega-3 Fatty Acids (FISH OIL PO) Take 1 tablet by mouth daily.    Marland Kitchen oxyCODONE-acetaminophen (PERCOCET) 10-325 MG tablet Take 1 tablet by mouth every 6 (six) hours as needed for pain. 40 tablet 0  . polyethylene glycol (MIRALAX / GLYCOLAX) packet Take 17 g by mouth daily as needed for mild constipation.    . terbinafine (LAMISIL) 250 MG tablet Take 250 mg  by mouth daily.    . Vitamin D, Ergocalciferol, (DRISDOL) 1.25 MG (50000 UNIT) CAPS capsule Take 50,000 Units by mouth once a week.     No current facility-administered medications on file prior to visit.   No Known Allergies  No results found for this or any previous visit (from the past 2160 hour(s)).  Objective: There were no vitals filed for this visit.  General: Patient is awake, alert, oriented in no acute distress  Dermatology: Skin is warm and dry bilateral with a partial thickness ulceration present at Right amputation stump. Ulceration plantar stump that measures pre debridement measures 0.3 x 0.  3 x 0.2 cm and post debridement measures  0. 3x 0. 5x0.2cm, similar to last visit with a granular base with decreased macerated and keratotic margins. The ulceration does not probe to bone.  There is no malodor, no active drainage.  No erythema, trace edema to stump on right.  No other acute signs of infection.  Vascular: Dorsalis Pedis pulse = 1/4 Bilateral,  Posterior Tibial pulse = 0/4 Bilateral,  Capillary Fill Time < 5 seconds, 1+ pitting edema.  Neurologic: Protective sensation absent bilateral.    Musculosketal: No pain with palpation to ulcerated area.  Status post right foot TMA.  No pain with compression to calves bilateral.  No results for input(s): GRAMSTAIN, LABORGA in the last 8760 hours.  Assessment and Plan:  Problem List Items Addressed This Visit      Endocrine   Diabetic polyneuropathy associated with type 2 diabetes mellitus ([place])     Other   History of transmetatarsal amputation of right foot ([place])    Other Visit Diagnoses    Skin ulcer of right foot, limited to breakdown of skin ([place])    -  Primary   Acquired equinus deformity of right foot         -Re-examined patient and discussed the progression of the wound and treatment alternatives. -Excisionally dedbrided ulceration at right amputation stump to healthy bleeding borders removing nonviable tissue  using a sterile 15 blade. Wound measures post debridement as above. Wound was debrided to the level of the dermis with viable wound base exposed to promote healing. Hemostasis was achieved with manuel pressure.  Minimal bleeding noted.  Patient tolerated procedure well without any discomfort or need for anesthesia. -Applied  foot miracle cream to surrounding skin and then to the plantar wound bed applied silver nitrate, 4x4, ABD pad, Kling wrap, and Coban to the right foot.  Advised patient to leave intact until next office visit -Continue with offloading padding in shoe with weightbearing only to heel; applied additional padding to shoe this visit - Advised patient to go to the ER or return to office if the wound worsens or if constitutional symptoms are present. -Patient to return to office next week for follow-up wound care  as scheduled Landis Martins, DPM

## 2020-06-29 ENCOUNTER — Ambulatory Visit (INDEPENDENT_AMBULATORY_CARE_PROVIDER_SITE_OTHER): Payer: BC Managed Care – PPO | Admitting: Podiatry

## 2020-06-29 ENCOUNTER — Other Ambulatory Visit: Payer: Self-pay

## 2020-06-29 ENCOUNTER — Encounter: Payer: Self-pay | Admitting: Podiatry

## 2020-06-29 DIAGNOSIS — Z89431 Acquired absence of right foot: Secondary | ICD-10-CM

## 2020-06-29 DIAGNOSIS — I739 Peripheral vascular disease, unspecified: Secondary | ICD-10-CM

## 2020-06-29 DIAGNOSIS — L97511 Non-pressure chronic ulcer of other part of right foot limited to breakdown of skin: Secondary | ICD-10-CM

## 2020-06-29 DIAGNOSIS — E1142 Type 2 diabetes mellitus with diabetic polyneuropathy: Secondary | ICD-10-CM

## 2020-06-29 NOTE — Progress Notes (Signed)
Subjective:  Patient ID: Adrian Bean, male    DOB: [birth year],  MRN: [id number]  69 y.o. male is seen today for evaluation of wound plantar aspect right foot.  Prescribed wound care regimen: silver nitrate to wound bed dressing once weekly.  Patient relates wound has improved.  Patient The patient is having no constitutional symptoms, denying fever, chills, anorexia, or weight loss..  Past Medical History:  Diagnosis Date  . Dermatophytosis, nail   . Diabetes mellitus    type 2  . Diabetic neuropathy ([place])    [name] 06/02/2017  . Diffuse connective tissue disease ([place])   . Gangrene of toe of right foot ([place]) 06/02/2017  . High cholesterol   . Hypertension   . PAD (peripheral artery disease) ([place]) 06/02/2017  . Peripheral vascular disease ([place])   . Thyroid disorder   . Tobacco abuse 06/02/2017  . Type II diabetes mellitus ([place]) 06/02/2017     Past Surgical History:  Procedure Laterality Date  . ABDOMINAL AORTAGRAM N/A 10/30/2011   Procedure: ABDOMINAL Maxcine Ham;  Surgeon: Serafina Mitchell, MD;  Location: [facility] CATH LAB;  Service: Cardiovascular;  Laterality: N/A;  . ABDOMINAL AORTOGRAM N/A 06/03/2017   Procedure: ABDOMINAL AORTOGRAM;  Surgeon: Conrad Croydon, MD;  Location: [facility] CV LAB;  Service: Cardiovascular;  Laterality: N/A;  . AMPUTATION Right 06/04/2017   Procedure: AMPUTATION 3rd toeDIGIT;  Surgeon: Rosetta Posner, MD;  Location: [facility];  Service: Vascular;  Laterality: Right;  . AMPUTATION Right 06/13/2017   Procedure: RIGHT TRANSMETATARSAL AMPUTATION;  Surgeon: [name], MD;  Location: [facility];  Service: Orthopedics;  Laterality: Right;  . CHOLECYSTECTOMY  06-20-2011  . CHOLECYSTECTOMY    . FEMORAL-POPLITEAL BYPASS GRAFT Right 06/04/2017   Procedure: BYPASS  FEMORAL-POPLITEAL ARTERY USING GREATER SAPHENOUS VEIN;  Surgeon: Rosetta Posner, MD;  Location: [facility];  Service: Vascular;  Laterality: Right;  . FOOT RAY RESECTION Left 2018   hx fourth and fifth ray  amputation/notes 06/02/2017  . IR ANGIOGRAM EXTREMITY LEFT  01/17/2017  . IR FEM POP ART PTA MOD SED  01/17/2017  . IR ILIAC ART STENT INC PTA EA ADD IPSILAT MOD SED  01/17/2017  . IR ILIAC ART STENT INC PTA MOD SED  01/17/2017  . IR THORACENTESIS ASP PLEURAL SPACE W/IMG GUIDE  06/16/2017  . IR US GUIDE VASC ACCESS LEFT  01/17/2017  . IR US GUIDE VASC ACCESS RIGHT  01/17/2017  . LOWER EXTREMITY ANGIOGRAPHY Bilateral 06/03/2017   Procedure: Lower Extremity Angiography;  Surgeon: Conrad [place], MD;  Location: [facility] CV LAB;  Service: Cardiovascular;  Laterality: Bilateral;  . PERIPHERAL VASCULAR CATHETERIZATION Left 12/2016   angioplasty of iliac artery  hx/notes 06/02/2017  . VEIN HARVEST Right 06/04/2017   Procedure: VEIN HARVEST RIGHT SAPHENOUS VEIN;  Surgeon: Rosetta Posner, MD;  Location: [facility] OR;  Service: Vascular;  Laterality: Right;     Current Outpatient Medications on File Prior to Visit  Medication Sig Dispense Refill  . albuterol (PROVENTIL) (2.5 MG/3ML) 0.083% nebulizer solution Take 3 mLs (2.5 mg total) by nebulization every 4 (four) hours as needed for wheezing or shortness of breath. 75 mL 12  . Amino Acids-Protein Hydrolys (FEEDING SUPPLEMENT, PRO-STAT SUGAR FREE 64,) LIQD Take 30 mLs by mouth 2 (two) times daily. 900 mL 0  . aspirin EC 81 MG EC tablet Take 1 tablet (81 mg total) by mouth daily.    Marland Kitchen atorvastatin (LIPITOR) 10 MG tablet Take 10 mg by mouth daily.    Marland Kitchen  atorvastatin (LIPITOR) 10 MG tablet Take 10 mg by mouth daily at 6 PM.    . bisacodyl (DULCOLAX) 10 MG suppository Place 1 suppository (10 mg total) rectally daily as needed for moderate constipation. 12 suppository 0  . ciprofloxacin (CIPRO) 500 MG tablet Take 1 tablet (500 mg total) by mouth 2 (two) times daily. 20 tablet 0  . clindamycin (CLEOCIN) 300 MG capsule     . clopidogrel (PLAVIX) 75 MG tablet Take 75 mg by mouth daily.    . collagenase (SANTYL) ointment Apply topically daily. 15 g 0  . feeding supplement,  GLUCERNA SHAKE, (GLUCERNA SHAKE) LIQD Take 237 mLs by mouth 3 (three) times daily between meals.  0  . gabapentin (NEURONTIN) 300 MG capsule Take 300 mg by mouth 3 (three) times daily.    Marland Kitchen gabapentin (NEURONTIN) 300 MG capsule Take 600 mg by mouth 3 (three) times daily.    Marland Kitchen glimepiride (AMARYL) 2 MG tablet Take 2 mg by mouth daily with breakfast.    . glimepiride (AMARYL) 2 MG tablet Take 2 mg by mouth daily with breakfast.    . HEPARIN LOCK FLUSH IV Inject into the vein.    Marland Kitchen HYDROcodone-acetaminophen (NORCO) 10-325 MG tablet Take 1 tablet by mouth every 6 (six) hours as needed for moderate pain.    Marland Kitchen lisinopril (PRINIVIL,ZESTRIL) 10 MG tablet Take 10 mg by mouth daily.    Marland Kitchen lisinopril (PRINIVIL,ZESTRIL) 10 MG tablet Take 10 mg by mouth daily.    Marland Kitchen lisinopril-hydrochlorothiazide (ZESTORETIC) 20-25 MG tablet     . Multiple Vitamin (MULTIVITAMIN WITH MINERALS) TABS tablet Take 1 tablet by mouth daily.    . multivitamin-iron-minerals-folic acid (CENTRUM) chewable tablet Chew 1 tablet by mouth daily.    . nicotine (NICODERM CQ - DOSED IN MG/24 HOURS) 14 mg/24hr patch Place 1 patch (14 mg total) onto the skin daily. 28 patch 0  . omega-3 acid ethyl esters (LOVAZA) 1 g capsule Take 1 g by mouth daily.    . Omega-3 Fatty Acids (FISH OIL PO) Take 1 tablet by mouth daily.    Marland Kitchen oxyCODONE-acetaminophen (PERCOCET) 10-325 MG tablet Take 1 tablet by mouth every 6 (six) hours as needed for pain. 40 tablet 0  . polyethylene glycol (MIRALAX / GLYCOLAX) packet Take 17 g by mouth daily as needed for mild constipation.    . terbinafine (LAMISIL) 250 MG tablet Take 250 mg by mouth daily.    . Vitamin D, Ergocalciferol, (DRISDOL) 1.25 MG (50000 UNIT) CAPS capsule Take 50,000 Units by mouth once a week.     No current facility-administered medications on file prior to visit.     No Known Allergies   Family History  Adopted: Yes     Social History   Occupational History  . Not on file  Tobacco Use  .  Smoking status: Current Every Day Smoker    Packs/day: 0.50    Years: 50.00    Pack years: 25.00    Types: Cigarettes  . Smokeless tobacco: Never Used  . Tobacco comment: 3 cigarettes per day  Vaping Use  . Vaping Use: Never used  Substance and Sexual Activity  . Alcohol use: No  . Drug use: No  . Sexual activity: Not Currently     Immunization History  Administered Date(s) Administered  . Influenza, High Dose Seasonal PF 06/05/2017     Review of systems: Positive Findings in bold print.  Constitutional:  chills, fatigue, fever, sweats, weight change Communication: Optometrist, sign Ecologist, hand  writing, iPad/Android device Head: head injury Eyes: changes in vision, eye pain, glaucoma, cataracts, macular degeneration, diplopia, glare, light sensitivity, eyeglasses or contacts, blindness Ears nose mouth throat: hearing impaired, hearing aids,  ringing in ears, deaf, sign language,  vertigo, nosebleeds,  rhinitis,  cold sores, snoring, swollen glands Cardiovascular: HTN, edema, arrhythmia, pacemaker in place, defibrillator in place, chest pain/tightness, chronic anticoagulation, blood clot, heart failure, MI Peripheral Vascular: leg cramps, varicose veins, blood clots, lymphedema, varicosities Respiratory:  asthma, difficulty breathing, denies congestion, SOB, wheezing, cough, emphysema, oxygen dependent Gastrointestinal: change in appetite or weight, abdominal pain, constipation, diarrhea, nausea, vomiting, vomiting blood, change in bowel habits, abdominal pain, jaundice, rectal bleeding, hemorrhoids, GERD Genitourinary:  nocturia,  pain on urination, polyuria,  blood in urine, Foley catheter, urinary urgency, ESRD on hemodialysis Musculoskeletal: amputation(s), cramping, stiff joints, painful joints, decreased joint motion, fractures, OA, gout, hemiplegia, paraplegia, uses cane, wheelchair bound, uses walker, uses rollator Skin: changes in toenails, color change, dryness,  itching, mole changes,  rash, wound(s) Neurological: headaches, numbness in feet, paresthesias in feet, burning in feet, fainting,  seizures, change in speech. denies headaches, memory problems/poor historian, cerebral palsy, weakness, paralysis, CVA, TIA, tremors Endocrine: diabetes, hypothyroidism, hyperthyroidism,  goiter, dry mouth, flushing, heat intolerance,  cold intolerance,  excessive thirst, denies polyuria,  nocturia Hematological:  easy bleeding, excessive bleeding, easy bruising, enlarged lymph nodes, on long term blood thinner, history of past transusions Allergy/immunological:  hives, eczema, frequent infections, multiple drug allergies, seasonal allergies, transplant recipient, multiple food allergies Psychiatric:  anxiety, depression, mood disorder, suicidal ideations, hallucinations, insomnia   Objective:  Physical Exam: There were no vitals filed for this visit.   Zakory Zucconi is a pleasant 69 y.o. Caucasian male WD, WN in NAD. AAO x 3.  Vascular Examination: Capillary refill time to digits <4 seconds b/l lower extremities. Faintly palpable DP pulse(s) b/l lower extremities. Nonpalpable PT pulse(s) b/l lower extremities. Pedal hair absent. Lower extremity skin temperature gradient within normal limits. +1 pitting edema b/l lower extremities. No ischemia or gangrene noted b/l lower extremities.  Dermatological Examination: Pedal skin with normal turgor, texture and tone bilaterally.  No images are attached to the encounter.  Wound Location: plantar aspect right foot There is a minimal amount of devitalized tissue present in the wound. Predebridement Wound Measurement:  0.6  x 0.7 x 0.3 cm. Postdebridement Wound Measurement: 0.7 x 0.9 x 0.3 cm. Wound Base: Granular/Healthy Peri-wound: Calloused Exudate: None: wound tissue dry Blood Loss during debridement: 1 cc('s). Material in wound which inhibits healing/promotes adjacent tissue breakdown:  nonviable  hyperkeratosis. Description of tissue removed from ulceration today:  nonviable hyperkeratosis. Sign(s) of clinical bacterial infection: no clinical signs of infection noted on examination today.  Musculoskeletal Examination: Normal muscle strength 5/5 to all lower extremity muscle groups bilaterally. Lower extremity amputation(s): Transmetatarsal amputation right lower extremity.  Neurological Examination: Protective sensation diminished with 10g monofilament b/l.  Labs: No flowsheet data found.     No results for input(s): GRAMSTAIN, LABORGA in the last 8760 hours.   Assessment:   1. Skin ulcer of right foot, limited to breakdown of skin ([place])   2. History of transmetatarsal amputation of right foot ([place])   3. PAD (peripheral artery disease) ([place])   4. Diabetic polyneuropathy associated with type 2 diabetes mellitus ([place])    Plan:  -Patient was evaluated and treated and all questions answered.  -Patient/POA/Family member educated on diagnosis and treatment plan of routine ulcer debridement/wound care.  -Ulceration debridement achieved utilizing sharp excisional  debridement with sterile scalpel blade and sterile currette.. Type/amount of devitalized tissue removed: nonviable hyperkeratosis -Today's ulcer size post-debridement: 0.7 x 0.9 x 0.3 cm. -Ulceration cleansed with wound cleanser. silver nitrate applied to base of ulceration and secured with light dressing. Applied 4 x 4's, ABD pad, Kling wrap and Coban. -Wound responded well to today's debridement. -Patient risk factors affecting healing of ulcer: diabetes, PAD, history of prior amputation, foot deformity, neuropathy -Patient to continue diabetic shoe gear daily. -Patient to report any pedal injuries to medical professional immediately. -Patient/POA to call should there be question/concern in the interim. -If patient experiences any fever, chills, night sweats, nausea or vomiting, elevated/low blood sugars, report to  emergency room. If patient experiences increased redness, pain, swelling, discoloration, odor, pus, drainage or warmth of foot, report to emergency room.   Return in about 1 week (around 07/06/2020), or wound care with Dr. Marylene Land.  Freddie Breech, DPM

## 2020-07-07 ENCOUNTER — Other Ambulatory Visit: Payer: Self-pay

## 2020-07-07 ENCOUNTER — Encounter: Payer: Self-pay | Admitting: Sports Medicine

## 2020-07-07 ENCOUNTER — Ambulatory Visit (INDEPENDENT_AMBULATORY_CARE_PROVIDER_SITE_OTHER): Payer: BC Managed Care – PPO | Admitting: Sports Medicine

## 2020-07-07 DIAGNOSIS — L97511 Non-pressure chronic ulcer of other part of right foot limited to breakdown of skin: Secondary | ICD-10-CM | POA: Diagnosis not present

## 2020-07-07 DIAGNOSIS — Z89431 Acquired absence of right foot: Secondary | ICD-10-CM

## 2020-07-07 DIAGNOSIS — I739 Peripheral vascular disease, unspecified: Secondary | ICD-10-CM

## 2020-07-07 DIAGNOSIS — E1142 Type 2 diabetes mellitus with diabetic polyneuropathy: Secondary | ICD-10-CM

## 2020-07-07 NOTE — Progress Notes (Signed)
Subjective: Adrian Bean is a 70 y.o. male patient seen in office for follow up evaluation of ulceration of the right foot amputation stump.  Reports he is doing good denies nausea vomiting fever chills or any constitutional symptoms at this time.  But states that he may not be able to come back to my office anymore due to his co-pay.  Fasting blood sugar does not recorded.   Patient Active Problem List   Diagnosis Date Noted  . Infected surgical wound 06/18/2018  . Peripheral polyneuropathy 06/18/2018  . Mixed dyslipidemia 06/17/2018  . Essential hypertension 12/12/2017  . Need for hepatitis C screening test 12/12/2017  . Screening for colon cancer 12/12/2017  . Diabetic polyneuropathy associated with type 2 diabetes mellitus ([place]) 09/17/2017  . 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 History of transmetatarsal amputation of right foot ([place]) 07/04/2017  . PAD (peripheral artery disease) ([place]) 06/02/2017  . Sepsis ([place]) 06/02/2017  . DM (diabetes mellitus), type 2 ([place]) 06/02/2017  . Tobacco abuse 06/02/2017  . Tick bite 01/22/2017  . Pain in limb 10/21/2011  . Swelling of limb 10/21/2011   Current Outpatient Medications on File Prior to Visit  Medication Sig Dispense Refill  . albuterol (PROVENTIL) (2.5 MG/3ML) 0.083% nebulizer solution Take 3 mLs (2.5 mg total) by nebulization every 4 (four) hours as needed for wheezing or shortness of breath. 75 mL 12  . Amino Acids-Protein Hydrolys (FEEDING SUPPLEMENT, PRO-STAT SUGAR FREE 64,) LIQD Take 30 mLs by mouth 2 (two) times daily. 900 mL 0  . aspirin EC 81 MG EC tablet Take 1 tablet (81 mg total) by mouth daily.    Marland Kitchen atorvastatin (LIPITOR) 10 MG tablet Take 10 mg by mouth daily.    Marland Kitchen atorvastatin (LIPITOR) 10 MG tablet Take 10 mg by mouth daily at 6 PM.    . bisacodyl (DULCOLAX) 10 MG suppository Place 1 suppository (10 mg total) rectally daily as needed for moderate  constipation. 12 suppository 0  . ciprofloxacin (CIPRO) 500 MG tablet Take 1 tablet (500 mg total) by mouth 2 (two) times daily. 20 tablet 0  . clindamycin (CLEOCIN) 300 MG capsule     . clopidogrel (PLAVIX) 75 MG tablet Take 75 mg by mouth daily.    . collagenase (SANTYL) ointment Apply topically daily. 15 g 0  . feeding supplement, GLUCERNA SHAKE, (GLUCERNA SHAKE) LIQD Take 237 mLs by mouth 3 (three) times daily between meals.  0  . gabapentin (NEURONTIN) 300 MG capsule Take 300 mg by mouth 3 (three) times daily.    Marland Kitchen gabapentin (NEURONTIN) 300 MG capsule Take 600 mg by mouth 3 (three) times daily.    Marland Kitchen glimepiride (AMARYL) 2 MG tablet Take 2 mg by mouth daily with breakfast.    . glimepiride (AMARYL) 2 MG tablet Take 2 mg by mouth daily with breakfast.    . HEPARIN LOCK FLUSH IV Inject into the vein.    Marland Kitchen HYDROcodone-acetaminophen (NORCO) 10-325 MG tablet Take 1 tablet by mouth every 6 (six) hours as needed for moderate pain.    Marland Kitchen lisinopril (PRINIVIL,ZESTRIL) 10 MG tablet Take 10 mg by mouth daily.    Marland Kitchen lisinopril (PRINIVIL,ZESTRIL) 10 MG tablet Take 10 mg by mouth daily.    Marland Kitchen lisinopril-hydrochlorothiazide (ZESTORETIC) 20-25 MG tablet     . Multiple Vitamin (MULTIVITAMIN WITH MINERALS) TABS tablet Take 1 tablet by mouth daily.    . multivitamin-iron-minerals-folic acid (CENTRUM) chewable tablet Chew 1 tablet by mouth daily.    Marland Kitchen  nicotine (NICODERM CQ - DOSED IN MG/24 HOURS) 14 mg/24hr patch Place 1 patch (14 mg total) onto the skin daily. 28 patch 0  . omega-3 acid ethyl esters (LOVAZA) 1 g capsule Take 1 g by mouth daily.    . Omega-3 Fatty Acids (FISH OIL PO) Take 1 tablet by mouth daily.    Marland Kitchen oxyCODONE-acetaminophen (PERCOCET) 10-325 MG tablet Take 1 tablet by mouth every 6 (six) hours as needed for pain. 40 tablet 0  . polyethylene glycol (MIRALAX / GLYCOLAX) packet Take 17 g by mouth daily as needed for mild constipation.    . terbinafine (LAMISIL) 250 MG tablet Take 250 mg by mouth  daily.    . Vitamin D, Ergocalciferol, (DRISDOL) 1.25 MG (50000 UNIT) CAPS capsule Take 50,000 Units by mouth once a week.     No current facility-administered medications on file prior to visit.   No Known Allergies  No results found for this or any previous visit (from the past 2160 hour(s)).  Objective: There were no vitals filed for this visit.  General: Patient is awake, alert, oriented in no acute distress  Dermatology: Skin is warm and dry bilateral with a partial thickness ulceration present at Right amputation stump. Ulceration plantar stump that measures pre debridement measures 0.3 x 0.4x 0.2 cm and post debridement measures  0. 5x 0. 5x0.2cm, smaller than last visit with a granular base with decreased macerated and keratotic margins. The ulceration does not probe to bone.  There is no malodor, no active drainage.  No erythema, trace edema to stump on right.  No other acute signs of infection.  Vascular: Dorsalis Pedis pulse = 1/4 Bilateral,  Posterior Tibial pulse = 0/4 Bilateral,  Capillary Fill Time < 5 seconds, 1+ pitting edema.  Neurologic: Protective sensation absent bilateral.    Musculosketal: No pain with palpation to ulcerated area.  Status post right foot TMA.  No pain with compression to calves bilateral.  No results for input(s): GRAMSTAIN, LABORGA in the last 8760 hours.  Assessment and Plan:  Problem List Items Addressed This Visit      Cardiovascular and Mediastinum   PAD (peripheral artery disease) ([place])     Endocrine   Diabetic polyneuropathy associated with type 2 diabetes mellitus ([place])     Other   History of transmetatarsal amputation of right foot ([place])    Other Visit Diagnoses    Skin ulcer of right foot, limited to breakdown of skin ([place])    -  Primary     -Re-examined patient and discussed the progression of the wound and treatment alternatives. -Excisionally dedbrided ulceration at right amputation stump to healthy bleeding borders  removing nonviable tissue using a sterile 15 blade. Wound measures post debridement as above. Wound was debrided to the level of the dermis with viable wound base exposed to promote healing. Hemostasis was achieved with manuel pressure.  Minimal bleeding noted.  Patient tolerated procedure well without any discomfort or need for anesthesia. -Applied  foot miracle cream to surrounding skin and then to the plantar wound bed applied silver nitrate, 4x4, ABD pad, Kling wrap, and Coban to the right foot.  Advised patient to leave intact until next office visit -Continue with offloading padding in shoe with weightbearing only to heel; applied additional padding to shoe this visit - Advised patient to go to the ER or return to office if the wound worsens or if constitutional symptoms are present. -Patient to return to office as scheduled for follow-up wound care.  Advised patient that we will look into the insurance issue and if co-pay is still expensive will refer him to other providers. Landis Martins, DPM

## 2020-07-10 ENCOUNTER — Telehealth: Payer: Self-pay | Admitting: Sports Medicine

## 2020-07-10 NOTE — Telephone Encounter (Signed)
[facility]s Gu[facility] Yes he can get home health Lattie Haw send orders to [facility]:  To right foot wound apply silver nitrate and calcium alginate and dry dressing to right foot ulcer 1-2x per week Thanks Dr. Cannon Kettle

## 2020-07-10 NOTE — Telephone Encounter (Signed)
Pt notified [facility] will contact him to sched/reb

## 2020-07-10 NOTE — Telephone Encounter (Signed)
I spoke to Mr. Adrian Bean Friday about calling insurance for a provider that accepts ccp with insurance.  He wanted to know if home health could start taking Bean of his wound again like they did previously.  Since you are no longer handling the graft, insurance will cover 100% for them to see him.  Pls advise.

## 2020-07-12 ENCOUNTER — Telehealth: Payer: Self-pay | Admitting: *Deleted

## 2020-07-12 NOTE — Telephone Encounter (Signed)
Sent over the wound orders per Dr Cannon Kettle to [facility] today and the fax number is [phone]. Lattie Haw

## 2020-07-18 ENCOUNTER — Telehealth: Payer: Self-pay | Admitting: *Deleted

## 2020-07-18 NOTE — Telephone Encounter (Signed)
[name] called from [facility] and left a message stating that the patient was doing well and that the skin ulcer was almost healed and wanted to use the silver sorb due to not having the silver nitrate and per Dr Cannon Kettle was ok with that and I called and left a message for [name] to call if any other concerns or questions. Adrian Bean

## 2020-07-20 ENCOUNTER — Telehealth: Payer: Self-pay | Admitting: *Deleted

## 2020-07-20 NOTE — Telephone Encounter (Signed)
Sharyn Lull called from [facility] and stated that the patient had fallen Sunday and had some scrapes and bruises but nothing severe and just wanted [place] to know and I relayed the message to Dr Cannon Kettle. Lattie Haw

## 2020-09-14 ENCOUNTER — Telehealth: Payer: Self-pay | Admitting: Sports Medicine

## 2020-09-14 NOTE — Telephone Encounter (Signed)
Ivin Booty with [facility] would like orders to extend wound care every 2 weeks x 9 weeks

## 2020-09-14 NOTE — Telephone Encounter (Signed)
Adrian Bean you can give them verbal orders to extend care for 9 weeks

## 2020-09-15 NOTE — Telephone Encounter (Signed)
I called and spoke with Sharyn Lull from [facility] and relayed the message per Dr Cannon Kettle. Lattie Haw

## 2020-10-24 ENCOUNTER — Ambulatory Visit: Payer: BC Managed Care – PPO | Admitting: Sports Medicine

## 2020-10-24 ENCOUNTER — Telehealth: Payer: Self-pay

## 2020-10-24 ENCOUNTER — Encounter: Payer: Self-pay | Admitting: Sports Medicine

## 2020-10-24 ENCOUNTER — Other Ambulatory Visit: Payer: Self-pay

## 2020-10-24 DIAGNOSIS — I739 Peripheral vascular disease, unspecified: Secondary | ICD-10-CM

## 2020-10-24 DIAGNOSIS — Z89431 Acquired absence of right foot: Secondary | ICD-10-CM

## 2020-10-24 DIAGNOSIS — L97511 Non-pressure chronic ulcer of other part of right foot limited to breakdown of skin: Secondary | ICD-10-CM

## 2020-10-24 DIAGNOSIS — E1142 Type 2 diabetes mellitus with diabetic polyneuropathy: Secondary | ICD-10-CM

## 2020-10-24 DIAGNOSIS — M216X1 Other acquired deformities of right foot: Secondary | ICD-10-CM

## 2020-10-24 NOTE — Telephone Encounter (Signed)
Contacted [facility], spoke to [place] and gave wound care orders instructed by Dr. Cannon Kettle. Per Dr. Cannon Kettle to apply offloading pad to bottom of right foot and alginate dressing to the wound once weekly.

## 2020-10-24 NOTE — Telephone Encounter (Signed)
-----   Message from [place], [place] sent at 10/24/2020 12:33 PM EDT ----- Regarding: Home nursing orders Apply offloading pad to the bottom of the right foot and alginate dressing to the wound once weekly

## 2020-10-24 NOTE — Progress Notes (Signed)
Subjective: Adrian Bean is a 70 y.o. male patient seen in office for follow up evaluation of ulceration of the right foot amputation stump.  Reports he has home nursing to help and things appear to be about the same.  Denies any constitutional symptoms at this time.    Fasting blood sugar does not recorded.   Patient Active Problem List   Diagnosis Date Noted  . Infected surgical wound 06/18/2018  . Peripheral polyneuropathy 06/18/2018  . Mixed dyslipidemia 06/17/2018  . Essential hypertension 12/12/2017  . Need for hepatitis C screening test 12/12/2017  . Screening for colon cancer 12/12/2017  . Diabetic polyneuropathy associated with type 2 diabetes mellitus ([place]) 09/17/2017  . 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 History of transmetatarsal amputation of right foot ([place]) 07/04/2017  . PAD (peripheral artery disease) ([place]) 06/02/2017  . Sepsis ([place]) 06/02/2017  . DM (diabetes mellitus), type 2 ([place]) 06/02/2017  . Tobacco abuse 06/02/2017  . Tick bite 01/22/2017  . Pain in limb 10/21/2011  . Swelling of limb 10/21/2011   Current Outpatient Medications on File Prior to Visit  Medication Sig Dispense Refill  . albuterol (PROVENTIL) (2.5 MG/3ML) 0.083% nebulizer solution Take 3 mLs (2.5 mg total) by nebulization every 4 (four) hours as needed for wheezing or shortness of breath. 75 mL 12  . Amino Acids-Protein Hydrolys (FEEDING SUPPLEMENT, PRO-STAT SUGAR FREE 64,) LIQD Take 30 mLs by mouth 2 (two) times daily. 900 mL 0  . aspirin EC 81 MG EC tablet Take 1 tablet (81 mg total) by mouth daily.    Marland Kitchen atorvastatin (LIPITOR) 10 MG tablet Take 10 mg by mouth daily.    Marland Kitchen atorvastatin (LIPITOR) 10 MG tablet Take 10 mg by mouth daily at 6 PM.    . bisacodyl (DULCOLAX) 10 MG suppository Place 1 suppository (10 mg total) rectally daily as needed for moderate constipation. 12 suppository 0  . ciprofloxacin (CIPRO) 500 MG tablet Take 1  tablet (500 mg total) by mouth 2 (two) times daily. 20 tablet 0  . clindamycin (CLEOCIN) 300 MG capsule     . clopidogrel (PLAVIX) 75 MG tablet Take 75 mg by mouth daily.    . collagenase (SANTYL) ointment Apply topically daily. 15 g 0  . feeding supplement, GLUCERNA SHAKE, (GLUCERNA SHAKE) LIQD Take 237 mLs by mouth 3 (three) times daily between meals.  0  . gabapentin (NEURONTIN) 300 MG capsule Take 300 mg by mouth 3 (three) times daily.    Marland Kitchen gabapentin (NEURONTIN) 300 MG capsule Take 600 mg by mouth 3 (three) times daily.    Marland Kitchen glimepiride (AMARYL) 2 MG tablet Take 2 mg by mouth daily with breakfast.    . glimepiride (AMARYL) 2 MG tablet Take 2 mg by mouth daily with breakfast.    . HEPARIN LOCK FLUSH IV Inject into the vein.    Marland Kitchen HYDROcodone-acetaminophen (NORCO) 10-325 MG tablet Take 1 tablet by mouth every 6 (six) hours as needed for moderate pain.    Marland Kitchen lisinopril (PRINIVIL,ZESTRIL) 10 MG tablet Take 10 mg by mouth daily.    Marland Kitchen lisinopril (PRINIVIL,ZESTRIL) 10 MG tablet Take 10 mg by mouth daily.    Marland Kitchen lisinopril-hydrochlorothiazide (ZESTORETIC) 20-25 MG tablet     . Multiple Vitamin (MULTIVITAMIN WITH MINERALS) TABS tablet Take 1 tablet by mouth daily.    . multivitamin-iron-minerals-folic acid (CENTRUM) chewable tablet Chew 1 tablet by mouth daily.    . nicotine (NICODERM CQ - DOSED IN MG/24 HOURS) 14  mg/24hr patch Place 1 patch (14 mg total) onto the skin daily. 28 patch 0  . omega-3 acid ethyl esters (LOVAZA) 1 g capsule Take 1 g by mouth daily.    . Omega-3 Fatty Acids (FISH OIL PO) Take 1 tablet by mouth daily.    Marland Kitchen oxyCODONE-acetaminophen (PERCOCET) 10-325 MG tablet Take 1 tablet by mouth every 6 (six) hours as needed for pain. 40 tablet 0  . polyethylene glycol (MIRALAX / GLYCOLAX) packet Take 17 g by mouth daily as needed for mild constipation.    . terbinafine (LAMISIL) 250 MG tablet Take 250 mg by mouth daily.    . Vitamin D, Ergocalciferol, (DRISDOL) 1.25 MG (50000 UNIT) CAPS  capsule Take 50,000 Units by mouth once a week.     No current facility-administered medications on file prior to visit.   No Known Allergies  No results found for this or any previous visit (from the past 2160 hour(s)).  Objective: There were no vitals filed for this visit.  General: Patient is awake, alert, oriented in no acute distress  Dermatology: Skin is warm and dry bilateral with a partial thickness ulceration present at Right amputation stump. Ulceration plantar stump that measures pre debridement measures 0.4 x 0.4x 0.2 cm and post debridement measures  0. 6x 0. 4x0.3cm, smaller than last visit with a granular base with decreased macerated and keratotic margins. The ulceration does not probe to bone.  There is no malodor, no active drainage.  No erythema, trace edema to stump on right.  No other acute signs of infection.  Vascular: Dorsalis Pedis pulse = 1/4 Bilateral,  Posterior Tibial pulse = 0/4 Bilateral,  Capillary Fill Time < 5 seconds, 1+ pitting edema.  Neurologic: Protective sensation absent bilateral.    Musculosketal: No pain with palpation to ulcerated area.  Status post right foot TMA.  No pain with compression to calves bilateral.  No results for input(s): GRAMSTAIN, LABORGA in the last 8760 hours.  Assessment and Plan:  Problem List Items Addressed This Visit      Cardiovascular and Mediastinum   PAD (peripheral artery disease) ([place])     Endocrine   Diabetic polyneuropathy associated with type 2 diabetes mellitus ([place])     Other   History of transmetatarsal amputation of right foot ([place])    Other Visit Diagnoses    Skin ulcer of right foot, limited to breakdown of skin ([place])    -  Primary   Acquired equinus deformity of right foot         -Re-examined patient and discussed the progression of the wound and treatment alternatives. -Excisionally dedbrided ulceration at right amputation stump to healthy bleeding borders removing nonviable tissue using a  sterile 15 blade. Wound measures post debridement as above. Wound was debrided to the level of the dermis with viable wound base exposed to promote healing. Hemostasis was achieved with manuel pressure.  Minimal bleeding noted.  Patient tolerated procedure well without any discomfort or need for anesthesia. -Applied Prisma, offloading pad 4x4, Kling wrap, and Coban to the right foot.  Advised patient to leave intact to have home nursing to change once weekly as directed -Continue with offloading padding in shoe with weightbearing only to heel; applied additional padding to shoe this visit - Advised patient to go to the ER or return to office if the wound worsens or if constitutional symptoms are present. -Patient to return to office as needed for wound care. Landis Martins, DPM

## 2020-11-07 ENCOUNTER — Telehealth: Payer: Self-pay

## 2020-11-07 NOTE — Telephone Encounter (Signed)
Kelly from [facility] called requesting Re-cert wound orders to continue seeing patient once a week at his home. Please advice

## 2020-11-09 NOTE — Telephone Encounter (Signed)
Called [name] from [facility] and Left 2 messages relayed Dr. Leeanne Rio approval for wound care orders

## 2020-12-15 DIAGNOSIS — E559 Vitamin D deficiency, unspecified: Secondary | ICD-10-CM | POA: Insufficient documentation

## 2020-12-15 DIAGNOSIS — R5383 Other fatigue: Secondary | ICD-10-CM | POA: Insufficient documentation

## 2020-12-15 DIAGNOSIS — R5381 Other malaise: Secondary | ICD-10-CM | POA: Insufficient documentation

## 2020-12-27 ENCOUNTER — Telehealth: Payer: Self-pay

## 2020-12-27 ENCOUNTER — Other Ambulatory Visit: Payer: Self-pay | Admitting: Sports Medicine

## 2020-12-27 MED ORDER — REGRANEX 0.01 % EX GEL: 1.0000 "application " | Freq: Every day | CUTANEOUS | 0 refills | Status: AC

## 2020-12-27 NOTE — Telephone Encounter (Signed)
Adrian Bean from [facility] called to obtain orders to see pt on the week of July 43EX to recert.  Adrian Bean also stated that they can not get the pt's wound to close, pt's wound size changes every time and they have tried a variety of creams and patches with no help. Adrian Bean would like to know if there is anything else to do for the pt and also wanted to point out that the Santly guy talked to them about regenex and if that would be something you are interested for the pt to try. Please advice

## 2020-12-27 NOTE — Progress Notes (Signed)
Regranex ordered for wound care

## 2020-12-28 NOTE — Telephone Encounter (Signed)
Contacted Kelly [facility] back and LVM stating Dr. Cannon Kettle approval for recert and Rx sent to pt's pharmacy

## 2021-03-06 ENCOUNTER — Telehealth: Payer: Self-pay

## 2021-03-06 NOTE — Telephone Encounter (Signed)
Kelly from [facility] LVM requesting if they can do a precert for 1 wk - and also to let the Dr. Gwyndolyn Kaufman that the skin/tissue is not a callus it looks and feels like a nail

## 2021-03-07 NOTE — Telephone Encounter (Signed)
Contacted [name] -[facility] and instructed her that per Dr. Cannon Kettle to send or txt her a pic of the tissue so she can have a better idea of how to treat it

## 2021-08-02 DIAGNOSIS — R17 Unspecified jaundice: Secondary | ICD-10-CM | POA: Insufficient documentation

## 2021-08-02 DIAGNOSIS — K8689 Other specified diseases of pancreas: Secondary | ICD-10-CM | POA: Insufficient documentation

## 2021-08-03 ENCOUNTER — Telehealth: Payer: Self-pay

## 2021-08-03 NOTE — Telephone Encounter (Signed)
We received an Urgent referral from [place] to have this patient scheduled for Consult.  Dr Ardis Hughs reviewed patient records and advised, "patient should proceed to an ER for expedited care due to bilirubin of 39."  Return call to Dr Carlos Levering office and spoke with Kenney Houseman and Butch Penny were both informed of Dr Ardis Hughs' recommendations for their office to make patient aware to proceed to ER.  Butch Penny verbalized understanding and agreed to plan.

## 2021-08-06 ENCOUNTER — Telehealth: Payer: Self-pay | Admitting: Hematology and Oncology

## 2021-08-06 NOTE — Telephone Encounter (Signed)
Contacted patient to schedule an appt and to verify if he has been scheduled with GI. Unable to reach over the phone, vm was not set up.

## 2021-08-17 ENCOUNTER — Encounter: Payer: Self-pay | Admitting: Oncology

## 2021-08-17 ENCOUNTER — Other Ambulatory Visit: Payer: Self-pay

## 2021-08-17 ENCOUNTER — Inpatient Hospital Stay: Payer: BC Managed Care – PPO | Attending: Oncology | Admitting: Oncology

## 2021-08-17 ENCOUNTER — Other Ambulatory Visit: Payer: Self-pay | Admitting: Oncology

## 2021-08-17 ENCOUNTER — Inpatient Hospital Stay: Payer: BC Managed Care – PPO

## 2021-08-17 VITALS — BP 85/53 | HR 73 | Temp 98.0°F | Resp 14 | Ht 72.0 in | Wt 149.0 lb

## 2021-08-17 DIAGNOSIS — C250 Malignant neoplasm of head of pancreas: Secondary | ICD-10-CM | POA: Insufficient documentation

## 2021-08-17 DIAGNOSIS — R5383 Other fatigue: Secondary | ICD-10-CM

## 2021-08-17 DIAGNOSIS — N189 Chronic kidney disease, unspecified: Secondary | ICD-10-CM | POA: Insufficient documentation

## 2021-08-17 DIAGNOSIS — C259 Malignant neoplasm of pancreas, unspecified: Secondary | ICD-10-CM | POA: Insufficient documentation

## 2021-08-17 DIAGNOSIS — R634 Abnormal weight loss: Secondary | ICD-10-CM | POA: Diagnosis not present

## 2021-08-17 DIAGNOSIS — Z79899 Other long term (current) drug therapy: Secondary | ICD-10-CM | POA: Diagnosis not present

## 2021-08-17 LAB — CBC AND DIFFERENTIAL
HCT: 24 — AB (ref 41–53)
Hemoglobin: 8.3 — AB (ref 13.5–17.5)
Neutrophils Absolute: 4.48
Platelets: 244 (ref 150–399)
WBC: 7

## 2021-08-17 LAB — BASIC METABOLIC PANEL
BUN: 27 — AB (ref 4–21)
CO2: 18 (ref 13–22)
Chloride: 110 — AB (ref 99–108)
Creatinine: 1.3 (ref 0.6–1.3)
Glucose: 268
Potassium: 3.4 (ref 3.4–5.3)
Sodium: 138 (ref 137–147)

## 2021-08-17 LAB — COMPREHENSIVE METABOLIC PANEL
Albumin: 3 — AB (ref 3.5–5.0)
Calcium: 9.5 (ref 8.7–10.7)

## 2021-08-17 LAB — HEPATIC FUNCTION PANEL
ALT: 53 — AB (ref 10–40)
AST: 57 — AB (ref 14–40)
Alkaline Phosphatase: 333 — AB (ref 25–125)
Bilirubin, Total: 11.7

## 2021-08-17 LAB — CBC: RBC: 2.36 — AB (ref 3.87–5.11)

## 2021-08-19 LAB — CANCER ANTIGEN 19-9: CA 19-9: 1949 U/mL — ABNORMAL HIGH (ref 0–35)

## 2021-08-21 ENCOUNTER — Telehealth: Payer: Self-pay

## 2021-08-21 NOTE — Telephone Encounter (Addendum)
@  1410- Melissa,NP, eRx Marinol to [facility].  I called Stanton Kidney, nurse @ [facility] to verify if their pharmacy dispenses Marinol? She confirmed they do and to just eRx to [facility] in [place].  The nurse reports he is weak. His BP is low and he is light headed. So they have drawn labs this morning. She is asking what the plan is for this gentleman? Is he Hospice appropriate or are we planning to try treatment? I couldn't find notes in EPIC.  RE: Megace on back order. Received: Today Melodye Ped, NP  Dairl Ponder, RN Prescription sent          08/21/21 Dr Bobby Rumpf: consider marinol  08/21/21 Kateria Cutrona,RN: Received call from nurse @ [facility]. Megace is on back order. They are asking if Dr Bobby Rumpf would like to order something different?

## 2021-08-22 ENCOUNTER — Other Ambulatory Visit: Payer: Self-pay | Admitting: Hematology and Oncology

## 2021-08-22 MED ORDER — DRONABINOL 5 MG PO CAPS: 5.0000 mg | ORAL_CAPSULE | Freq: Two times a day (BID) | ORAL | 0 refills | Status: DC

## 2021-08-22 MED ORDER — DRONABINOL 5 MG PO CAPS: 5.0000 mg | ORAL_CAPSULE | Freq: Two times a day (BID) | ORAL | 0 refills | Status: AC

## 2021-08-26 NOTE — Progress Notes (Signed)
[facility]  [address] [place],  [place]  [address] [phone]  Clinic Day:  08/17/2021  Referring physician: [name], MD   HISTORY OF PRESENT ILLNESS:  The patient is a 71 y.o. male  who I was asked to consult upon for newly diagnosed pancreatic cancer.  The patient's history dates back approximately 1 month ago when he first began having painless jaundice.  Over these past few months, this patient has lost at least 25 pounds.  He has also become progressively weaker.  Due to this constellation of symptoms, the patient underwent a CT scan of his abdomen/pelvis in early February 2023.  This study revealed a 3.1 x 2.1 x 1.9 cm pancreatic mass.  Prominent lymph nodes at his porta hepatis were also appreciated.  However, no obvious metastatic disease was seen.  The results from these scans scans led to this patient undergoing an endoscopic ultrasound a few days later.  Pathology from the fine-needle aspirate of the mass in question came back consistent with adenocarcinoma.  The patient comes in today to go over his biopsy results and their implications.  Of note, this gentleman resides at a nursing facility.  He has very poor baseline health to where he needs help with his activities of daily living.  He comes in today with a nurse's aide from the facility to determine what realistically can be done for his disease management.  PAST MEDICAL HISTORY:   Past Medical History:  Diagnosis Date   Dermatophytosis, nail    Diabetes mellitus    type 2   Diabetic neuropathy ([place])    [name] 06/02/2017   Diffuse connective tissue disease ([place])    Gangrene of toe of right foot ([place]) 06/02/2017   High cholesterol    Hypertension    PAD (peripheral artery disease) ([place]) 06/02/2017   Peripheral vascular disease ([place])    Thyroid disorder    Tobacco abuse 06/02/2017   Type II diabetes mellitus ([place]) 06/02/2017    PAST SURGICAL HISTORY:   Past Surgical  History:  Procedure Laterality Date   ABDOMINAL AORTAGRAM N/A 10/30/2011   Procedure: ABDOMINAL Maxcine Ham;  Surgeon: Serafina Mitchell, MD;  Location: [facility] CATH LAB;  Service: Cardiovascular;  Laterality: N/A;   ABDOMINAL AORTOGRAM N/A 06/03/2017   Procedure: ABDOMINAL AORTOGRAM;  Surgeon: Conrad Trinity, MD;  Location: [facility] CV LAB;  Service: Cardiovascular;  Laterality: N/A;   AMPUTATION Right 06/04/2017   Procedure: AMPUTATION 3rd toeDIGIT;  Surgeon: Rosetta Posner, MD;  Location: [facility];  Service: Vascular;  Laterality: Right;   AMPUTATION Right 06/13/2017   Procedure: RIGHT TRANSMETATARSAL AMPUTATION;  Surgeon: [name], MD;  Location: [facility];  Service: Orthopedics;  Laterality: Right;   CHOLECYSTECTOMY  06-20-2011   CHOLECYSTECTOMY     FEMORAL-POPLITEAL BYPASS GRAFT Right 06/04/2017   Procedure: BYPASS  FEMORAL-POPLITEAL ARTERY USING GREATER SAPHENOUS VEIN;  Surgeon: Rosetta Posner, MD;  Location: [facility];  Service: Vascular;  Laterality: Right;   FOOT RAY RESECTION Left 2018   hx fourth and fifth ray amputation/notes 06/02/2017   IR ANGIOGRAM EXTREMITY LEFT  01/17/2017   IR FEM POP ART PTA MOD SED  01/17/2017   IR ILIAC ART STENT INC PTA EA ADD IPSILAT MOD SED  01/17/2017   IR ILIAC ART STENT INC PTA MOD SED  01/17/2017   IR THORACENTESIS ASP PLEURAL SPACE W/IMG GUIDE  06/16/2017   IR US GUIDE VASC ACCESS LEFT  01/17/2017   IR  US GUIDE VASC ACCESS RIGHT  01/17/2017   LOWER EXTREMITY ANGIOGRAPHY Bilateral 06/03/2017   Procedure: Lower Extremity Angiography;  Surgeon: Conrad [place], MD;  Location: [facility] CV LAB;  Service: Cardiovascular;  Laterality: Bilateral;   PERIPHERAL VASCULAR CATHETERIZATION Left 12/2016   angioplasty of iliac artery  hx/notes 06/02/2017   VEIN HARVEST Right 06/04/2017   Procedure: VEIN HARVEST RIGHT SAPHENOUS VEIN;  Surgeon: Rosetta Posner, MD;  Location: [facility] OR;  Service: Vascular;  Laterality: Right;    CURRENT MEDICATIONS:   Current Outpatient Medications   Medication Sig Dispense Refill   ergocalciferol (VITAMIN D2) 1.25 MG (50000 UT) capsule Take 1 capsule by mouth once a week.     lisinopril-hydrochlorothiazide (ZESTORETIC) 20-25 MG tablet Take 1 tablet by mouth daily.     polyethylene glycol powder (GLYCOLAX/MIRALAX) 17 GM/SCOOP powder Take by mouth.     tamsulosin (FLOMAX) 0.4 MG CAPS capsule Take 1 capsule by mouth daily.     albuterol (PROVENTIL) (2.5 MG/3ML) 0.083% nebulizer solution Take 3 mLs (2.5 mg total) by nebulization every 4 (four) hours as needed for wheezing or shortness of breath. 75 mL 12   Amino Acids-Protein Hydrolys (FEEDING SUPPLEMENT, PRO-STAT SUGAR FREE 64,) LIQD Take 30 mLs by mouth 2 (two) times daily. 900 mL 0   aspirin EC 81 MG EC tablet Take 1 tablet (81 mg total) by mouth daily.     atorvastatin (LIPITOR) 10 MG tablet Take 10 mg by mouth daily at 6 PM.     becaplermin (REGRANEX) 0.01 % gel Apply 1 application topically daily. 15 g 0   bisacodyl (DULCOLAX) 10 MG suppository Place 1 suppository (10 mg total) rectally daily as needed for moderate constipation. 12 suppository 0   clopidogrel (PLAVIX) 75 MG tablet Take 75 mg by mouth daily.     dronabinol (MARINOL) 5 MG capsule Take 1 capsule (5 mg total) by mouth 2 (two) times daily before lunch and supper. 60 capsule 0   feeding supplement, GLUCERNA SHAKE, (GLUCERNA SHAKE) LIQD Take 237 mLs by mouth 3 (three) times daily between meals.  0   gabapentin (NEURONTIN) 300 MG capsule Take 600 mg by mouth 3 (three) times daily.     glimepiride (AMARYL) 2 MG tablet Take 2 mg by mouth daily with breakfast.     HEPARIN LOCK FLUSH IV Inject into the vein.     HYDROcodone-acetaminophen (NORCO) 10-325 MG tablet Take 1 tablet by mouth every 6 (six) hours as needed for moderate pain.     Multiple Vitamin (MULTIVITAMIN WITH MINERALS) TABS tablet Take 1 tablet by mouth daily.     nicotine (NICODERM CQ - DOSED IN MG/24 HOURS) 14 mg/24hr patch Place 1 patch (14 mg total) onto the skin  daily. 28 patch 0   omega-3 acid ethyl esters (LOVAZA) 1 g capsule Take 1 g by mouth daily.     terbinafine (LAMISIL) 250 MG tablet Take 250 mg by mouth daily.     Vitamin D, Ergocalciferol, (DRISDOL) 1.25 MG (50000 UNIT) CAPS capsule Take 50,000 Units by mouth once a week.     No current facility-administered medications for this visit.    ALLERGIES:  No Known Allergies  FAMILY HISTORY:  He is adopted and does not know his biological family's medical history.  SOCIAL HISTORY:  The patient was born and raised in [place].  He currently lives at a local nursing facility.  He is separated, with 1 child and 1 grandchild.  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  He did smoke a pack of cigarettes weekly for 56 years.  There is no history of alcohol abuse.  REVIEW OF SYSTEMS:  Review of Systems  Constitutional:  Positive for fatigue and unexpected weight change. Negative for fever.  Respiratory:  Negative for chest tightness, cough, hemoptysis and shortness of breath.   Cardiovascular:  Negative for chest pain and palpitations.  Gastrointestinal:  Negative for abdominal distention, abdominal pain, blood in stool, constipation, diarrhea, nausea and vomiting.  Genitourinary:  Negative for dysuria, frequency and hematuria.   Musculoskeletal:  Negative for arthralgias, back pain and myalgias.  Skin:  Negative for itching and rash.  Neurological:  Negative for dizziness, headaches and light-headedness.  Psychiatric/Behavioral:  Negative for depression and suicidal ideas. The patient is not nervous/anxious.     PHYSICAL EXAM:  Blood pressure (!) 85/53, pulse 73, temperature 98 F (36.7 C), resp. rate 14, height 6' (1.829 m), weight 149 lb (67.6 kg), SpO2 100 %. Wt Readings from Last 3 Encounters:  08/17/21 149 lb (67.6 kg)  09/04/17 177 lb (80.3 kg)  08/28/17 177 lb (80.3 kg)   Body mass index is 20.21 kg/m. Performance status (ECOG): 3 -  Symptomatic, >50% confined to bed Physical Exam Constitutional:      Appearance: Normal appearance. He is ill-appearing.     Comments: A very dishelved and chronically ill-appearing gentleman in a wheelchair.  He appears to be in poor health  HENT:     Mouth/Throat:     Mouth: Mucous membranes are moist.     Pharynx: Oropharynx is clear. No oropharyngeal exudate or posterior oropharyngeal erythema.  Cardiovascular:     Rate and Rhythm: Normal rate and regular rhythm.     Heart sounds: No murmur heard.   No friction rub. No gallop.  Pulmonary:     Effort: Pulmonary effort is normal. No respiratory distress.     Breath sounds: Normal breath sounds. No wheezing, rhonchi or rales.  Abdominal:     General: Bowel sounds are normal. There is no distension.     Palpations: Abdomen is soft. There is no mass.     Tenderness: There is no abdominal tenderness.  Musculoskeletal:        General: No swelling.     Right lower leg: No edema.     Left lower leg: No edema.  Feet:     Comments: Partially amputated right foot Lymphadenopathy:     Cervical: No cervical adenopathy.     Upper Body:     Right upper body: No supraclavicular or axillary adenopathy.     Left upper body: No supraclavicular or axillary adenopathy.     Lower Body: No right inguinal adenopathy. No left inguinal adenopathy.  Skin:    General: Skin is warm.     Coloration: Skin is not jaundiced.     Findings: No lesion or rash.  Neurological:     General: No focal deficit present.     Mental Status: He is alert and oriented to person, place, and time. Mental status is at baseline.  Psychiatric:        Mood and Affect: Mood normal.        Behavior: Behavior normal.        Thought Content: Thought content normal.    LABS:   CBC Latest Ref Rng & Units 08/17/2021 06/16/2017 06/13/2017  WBC - 7.0 8.8 11.3(H)  Hemoglobin 13.5 - 17.5 8.3(A) 9.4(L) 9.2(L)  Hematocrit 41 -  53 24(A) 29.9(L) 28.9(L)  Platelets 150 - 399 244 334  388   CMP Latest Ref Rng & Units 08/17/2021 06/16/2017 06/14/2017  Glucose 65 - 99 mg/dL - 155(H) 139(H)  BUN 4 - 21 27(A) 23(H) 25(H)  Creatinine 0.6 - 1.3 1.3 1.21 1.37(H)  Sodium 137 - 147 138 135 132(L)  Potassium 3.4 - 5.3 3.4 3.9 4.4  Chloride 99 - 108 110(A) 96(L) 97(L)  CO2 13 - 22 18 31 29   Calcium 8.7 - 10.7 9.5 8.6(L) 8.2(L)  Total Protein 6.5 - 8.1 g/dL - 6.1(L) -  Total Bilirubin 0.3 - 1.2 mg/dL - 0.7 -  Alkaline Phos 25 - 125 333(A) 123 -  AST 14 - 40 57(A) 17 -  ALT 10 - 40 53(A) 14(L) -   Lab Results  Component Value Date   [id number] 1,949 (H) 08/17/2021   ASSESSMENT & PLAN:  A 71 y.o. 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  Usually, with such a diagnosis, I would consider giving neoadjuvant chemotherapy, followed potential surgery.  However, as mentioned previously, this gentleman is in very poor health.   Unless there is some miraculous improvement in his health, palliative care may be all that can be considered for his disease management.  I will give him these next few weeks to hopefully improve his baseline health before definitively deciding if some form of intervention can be considered for his pancreatic cancer.  I will see him back in 3 weeks for repeat clinical assessment.  The patient understands all the plans discussed today and is in agreement with them.  I do appreciate [name], MD for his new consult.   Wagner Tanzi Macarthur Critchley, MD

## 2021-09-03 NOTE — Progress Notes (Signed)
?[facility]  ?[address] ?[place],  [place]  [address] ?[phone] ? ?Clinic Day:  09/07/2021 ? ?Referring physician: [name], MD ? ?This document serves as a record of services personally performed by Marice Potter, MD. It was created on their behalf by Curry,Lauren E, a trained medical scribe. The creation of this record is based on the scribe's personal observations and the provider's statements to them. ? ?HISTORY OF PRESENT ILLNESS:  ?The patient is a 71 y.o. 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  Due to his very debilitated state at his initial visit, he comes back in today to determine if his status is better to where some form of cancer management could be considered.  Unfortunately, this gentleman's health remains very poor.  Although there is a report of his appetite being significantly diminished, the patient claims this is due to the nursing home's food being barely edible.  He denies having any epigastric pain or other symptoms related to his underlying pancreatic cancer.  However, he has become reliant upon his nursing staff to help him with pathology his activities of daily living. ? ?PHYSICAL EXAM:  ?Blood pressure (!) 82/56, pulse (!) 117, temperature 97.7 ?F (36.5 ?C), resp. rate 14, height 6' (1.829 m), SpO2 98 %. ?Wt Readings from Last 3 Encounters:  ?08/17/21 149 lb (67.6 kg)  ?09/04/17 177 lb (80.3 kg)  ?08/28/17 177 lb (80.3 kg)  ? ?Body mass index is 20.21 kg/m?Marland Kitchen ?Performance status (ECOG): 3 - Symptomatic, >50% confined to bed ?Physical Exam ?Constitutional:   ?   Appearance: Normal appearance. He is ill-appearing.  ?   Comments: A very dishelved and chronically ill-appearing gentleman in a wheelchair.  He appears gravely ill and cachectic.  ?HENT:  ?   Mouth/Throat:  ?   Mouth: Mucous membranes are moist.  ?   Pharynx: Oropharynx is clear. No oropharyngeal  exudate or posterior oropharyngeal erythema.  ?Cardiovascular:  ?   Rate and Rhythm: Regular rhythm. Tachycardia present.  ?   Heart sounds: No murmur heard. ?  No friction rub. No gallop.  ?Pulmonary:  ?   Effort: Pulmonary effort is normal. No respiratory distress.  ?   Breath sounds: Normal breath sounds. No wheezing, rhonchi or rales.  ?Abdominal:  ?   General: Bowel sounds are normal. There is no distension.  ?   Palpations: Abdomen is soft. There is no mass.  ?   Tenderness: There is no abdominal tenderness.  ?Musculoskeletal:     ?   General: No swelling.  ?   Right lower leg: No edema.  ?   Left lower leg: No edema.  ?Feet:  ?   Comments: Partially amputated right foot ?Lymphadenopathy:  ?   Cervical: No cervical adenopathy.  ?   Upper Body:  ?   Right upper body: No supraclavicular or axillary adenopathy.  ?   Left upper body: No supraclavicular or axillary adenopathy.  ?   Lower Body: No right inguinal adenopathy. No left inguinal adenopathy.  ?Skin: ?   General: Skin is warm.  ?   Coloration: Skin is ashen and sallow. Skin is not jaundiced.  ?   Findings: No lesion or rash.  ?Neurological:  ?   General: No focal deficit present.  ?   Mental Status: He is alert and oriented to person, place, and time. Mental status is at baseline.  ?  Psychiatric:     ?   Mood and Affect: Mood normal.     ?   Behavior: Behavior normal.     ?   Thought Content: Thought content normal.  ? ? ?LABS:  ? ?CBC Latest Ref Rng & Units 08/17/2021 06/16/2017 06/13/2017  ?WBC - 7.0 8.8 11.3(H)  ?Hemoglobin 13.5 - 17.5 8.3(A) 9.4(L) 9.2(L)  ?Hematocrit 41 - 53 24(A) 29.9(L) 28.9(L)  ?Platelets 150 - 399 244 334 388  ? ?CMP Latest Ref Rng & Units 08/17/2021 06/16/2017 06/14/2017  ?Glucose 65 - 99 mg/dL - 155(H) 139(H)  ?BUN 4 - 21 27(A) 23(H) 25(H)  ?Creatinine 0.6 - 1.3 1.3 1.21 1.37(H)  ?Sodium 137 - 147 138 135 132(L)  ?Potassium 3.4 - 5.3 3.4 3.9 4.4  ?Chloride 99 - 108 110(A) 96(L) 97(L)  ?CO2 13 - '22 18 31 29  '$ ?Calcium 8.7 - 10.7 9.5  8.6(L) 8.2(L)  ?Total Protein 6.5 - 8.1 g/dL - 6.1(L) -  ?Total Bilirubin 0.3 - 1.2 mg/dL - 0.7 -  ?Alkaline Phos 25 - 125 333(A) 123 -  ?AST 14 - 40 57(A) 17 -  ?ALT 10 - 40 53(A) 14(L) -  ? ?Lab Results  ?Component Value Date  ? [id number] 1,949 (H) 08/17/2021  ? ?ASSESSMENT & PLAN:  ?A 72 y.o. male with stage IIB (T2 N1 M0) pancreatic cancer.  Unfortunately, this gentleman's health has gotten even worse since his last visit.  He appears to be very cachectic and in the terminal stages of life.  He understands that there is no potential way he could survive neoadjuvant chemotherapy, followed by surgery for his pancreatic cancer.  I have very significant reservations that his life expectancy may extend beyond the next 2 months.  Although he lives at a local nursing facility, I will bring in hospice to help him with the terminal care he may require over these next few weeks.  No future follow-up visits will be scheduled.  However, the patient and his nursing home know to contact office if they have additional questions regarding the status of his locally advanced pancreatic cancer.  The patient understands all the plans discussed today and is in agreement with them. ? ? ?I, Rita Ohara, am acting as scribe for Marice Potter, MD   ? ?I have reviewed this report as typed by the medical scribe, and it is complete and accurate. ? ?Apryll Hinkle Macarthur Critchley, MD ? ? ? ?  ?

## 2021-09-07 ENCOUNTER — Inpatient Hospital Stay: Payer: BC Managed Care – PPO

## 2021-09-07 ENCOUNTER — Inpatient Hospital Stay: Payer: BC Managed Care – PPO | Attending: Oncology | Admitting: Oncology

## 2021-09-07 ENCOUNTER — Other Ambulatory Visit: Payer: Self-pay

## 2021-09-07 VITALS — BP 82/56 | HR 117 | Temp 97.7°F | Resp 14 | Ht 72.0 in

## 2021-09-07 DIAGNOSIS — C251 Malignant neoplasm of body of pancreas: Secondary | ICD-10-CM

## 2021-09-29 DEATH — deceased
